# Patient Record
Sex: Male | Born: 1935 | Race: Black or African American | Hispanic: No | Marital: Married | Smoking: Former smoker
Health system: Southern US, Community
[De-identification: ages and names within clinical notes are randomized; demographics above are authoritative.]

## PROBLEM LIST (undated history)

## (undated) DIAGNOSIS — Z8739 Personal history of other diseases of the musculoskeletal system and connective tissue: Secondary | ICD-10-CM

## (undated) DIAGNOSIS — S0990XA Unspecified injury of head, initial encounter: Secondary | ICD-10-CM

## (undated) DIAGNOSIS — I10 Essential (primary) hypertension: Secondary | ICD-10-CM

## (undated) DIAGNOSIS — K5900 Constipation, unspecified: Secondary | ICD-10-CM

## (undated) DIAGNOSIS — D374 Neoplasm of uncertain behavior of colon: Secondary | ICD-10-CM

## (undated) HISTORY — PX: COLONOSCOPY W/ BIOPSIES: SHX1374

## (undated) HISTORY — PX: COLECTOMY: SHX59

---

## 2011-05-24 DIAGNOSIS — I10 Essential (primary) hypertension: Secondary | ICD-10-CM | POA: Diagnosis not present

## 2011-05-24 DIAGNOSIS — Z86010 Personal history of colonic polyps: Secondary | ICD-10-CM | POA: Diagnosis not present

## 2011-05-24 DIAGNOSIS — Z136 Encounter for screening for cardiovascular disorders: Secondary | ICD-10-CM | POA: Diagnosis not present

## 2011-05-24 DIAGNOSIS — Z13220 Encounter for screening for lipoid disorders: Secondary | ICD-10-CM | POA: Diagnosis not present

## 2011-07-06 DIAGNOSIS — K648 Other hemorrhoids: Secondary | ICD-10-CM | POA: Diagnosis not present

## 2011-07-06 DIAGNOSIS — Z86010 Personal history of colonic polyps: Secondary | ICD-10-CM | POA: Diagnosis not present

## 2011-07-06 DIAGNOSIS — Z09 Encounter for follow-up examination after completed treatment for conditions other than malignant neoplasm: Secondary | ICD-10-CM | POA: Diagnosis not present

## 2011-09-21 DIAGNOSIS — H2510 Age-related nuclear cataract, unspecified eye: Secondary | ICD-10-CM | POA: Diagnosis not present

## 2011-10-14 DIAGNOSIS — M109 Gout, unspecified: Secondary | ICD-10-CM | POA: Diagnosis not present

## 2011-10-14 DIAGNOSIS — R609 Edema, unspecified: Secondary | ICD-10-CM | POA: Diagnosis not present

## 2011-11-22 DIAGNOSIS — R0609 Other forms of dyspnea: Secondary | ICD-10-CM | POA: Diagnosis not present

## 2011-11-22 DIAGNOSIS — M109 Gout, unspecified: Secondary | ICD-10-CM | POA: Diagnosis not present

## 2011-11-22 DIAGNOSIS — I10 Essential (primary) hypertension: Secondary | ICD-10-CM | POA: Diagnosis not present

## 2011-11-22 DIAGNOSIS — R0989 Other specified symptoms and signs involving the circulatory and respiratory systems: Secondary | ICD-10-CM | POA: Diagnosis not present

## 2011-11-22 DIAGNOSIS — J3489 Other specified disorders of nose and nasal sinuses: Secondary | ICD-10-CM | POA: Diagnosis not present

## 2012-05-23 DIAGNOSIS — Z23 Encounter for immunization: Secondary | ICD-10-CM | POA: Diagnosis not present

## 2012-06-08 DIAGNOSIS — I10 Essential (primary) hypertension: Secondary | ICD-10-CM | POA: Diagnosis not present

## 2012-06-08 DIAGNOSIS — R609 Edema, unspecified: Secondary | ICD-10-CM | POA: Diagnosis not present

## 2012-07-06 DIAGNOSIS — R439 Unspecified disturbances of smell and taste: Secondary | ICD-10-CM | POA: Diagnosis not present

## 2012-07-06 DIAGNOSIS — Z79899 Other long term (current) drug therapy: Secondary | ICD-10-CM | POA: Diagnosis not present

## 2012-07-06 DIAGNOSIS — R609 Edema, unspecified: Secondary | ICD-10-CM | POA: Diagnosis not present

## 2012-09-19 DIAGNOSIS — H3530 Unspecified macular degeneration: Secondary | ICD-10-CM | POA: Diagnosis not present

## 2012-12-12 DIAGNOSIS — R439 Unspecified disturbances of smell and taste: Secondary | ICD-10-CM | POA: Diagnosis not present

## 2012-12-12 DIAGNOSIS — R609 Edema, unspecified: Secondary | ICD-10-CM | POA: Diagnosis not present

## 2012-12-12 DIAGNOSIS — I10 Essential (primary) hypertension: Secondary | ICD-10-CM | POA: Diagnosis not present

## 2013-01-12 DIAGNOSIS — I10 Essential (primary) hypertension: Secondary | ICD-10-CM | POA: Diagnosis not present

## 2013-02-24 DIAGNOSIS — Z23 Encounter for immunization: Secondary | ICD-10-CM | POA: Diagnosis not present

## 2013-06-14 DIAGNOSIS — I10 Essential (primary) hypertension: Secondary | ICD-10-CM | POA: Diagnosis not present

## 2013-06-14 DIAGNOSIS — E669 Obesity, unspecified: Secondary | ICD-10-CM | POA: Diagnosis not present

## 2013-06-14 DIAGNOSIS — Z1331 Encounter for screening for depression: Secondary | ICD-10-CM | POA: Diagnosis not present

## 2013-12-11 DIAGNOSIS — Z23 Encounter for immunization: Secondary | ICD-10-CM | POA: Diagnosis not present

## 2013-12-11 DIAGNOSIS — I499 Cardiac arrhythmia, unspecified: Secondary | ICD-10-CM | POA: Diagnosis not present

## 2013-12-11 DIAGNOSIS — R609 Edema, unspecified: Secondary | ICD-10-CM | POA: Diagnosis not present

## 2013-12-11 DIAGNOSIS — I10 Essential (primary) hypertension: Secondary | ICD-10-CM | POA: Diagnosis not present

## 2014-02-13 DIAGNOSIS — Z23 Encounter for immunization: Secondary | ICD-10-CM | POA: Diagnosis not present

## 2014-03-14 DIAGNOSIS — H35033 Hypertensive retinopathy, bilateral: Secondary | ICD-10-CM | POA: Diagnosis not present

## 2016-08-13 ENCOUNTER — Other Ambulatory Visit: Payer: Self-pay | Admitting: Gastroenterology

## 2016-08-13 DIAGNOSIS — K6389 Other specified diseases of intestine: Secondary | ICD-10-CM

## 2016-08-16 ENCOUNTER — Ambulatory Visit
Admission: RE | Admit: 2016-08-16 | Discharge: 2016-08-16 | Disposition: A | Discharge status: Home / Self Care | Payer: Medicare Other | Source: Ambulatory Visit | Attending: Gastroenterology | Admitting: Gastroenterology

## 2016-08-16 DIAGNOSIS — K6389 Other specified diseases of intestine: Secondary | ICD-10-CM

## 2016-08-16 MED ORDER — IOPAMIDOL (ISOVUE-300) INJECTION 61%
100.0000 mL | Freq: Once | INTRAVENOUS | Status: AC | PRN
  Administered 2016-08-16: 100 mL via INTRAVENOUS

## 2016-08-30 ENCOUNTER — Other Ambulatory Visit: Payer: Self-pay | Admitting: General Surgery

## 2016-09-06 ENCOUNTER — Encounter (HOSPITAL_COMMUNITY)
Admission: RE | Admit: 2016-09-06 | Discharge: 2016-09-06 | Disposition: A | Discharge status: Home / Self Care | Payer: Medicare Other | Source: Ambulatory Visit | Attending: General Surgery | Admitting: General Surgery

## 2016-09-06 ENCOUNTER — Encounter (HOSPITAL_COMMUNITY): Payer: Self-pay

## 2016-09-06 DIAGNOSIS — Z01812 Encounter for preprocedural laboratory examination: Secondary | ICD-10-CM

## 2016-09-06 DIAGNOSIS — K639 Disease of intestine, unspecified: Secondary | ICD-10-CM

## 2016-09-06 HISTORY — DX: Unspecified injury of head, initial encounter: S09.90XA

## 2016-09-06 HISTORY — DX: Constipation, unspecified: K59.00

## 2016-09-06 HISTORY — DX: Essential (primary) hypertension: I10

## 2016-09-06 LAB — CBC WITH DIFFERENTIAL/PLATELET
BASOS ABS: 0 10*3/uL (ref 0.0–0.1)
Basophils Relative: 1 %
EOS ABS: 0.2 10*3/uL (ref 0.0–0.7)
EOS PCT: 3 %
HCT: 40.1 % (ref 39.0–52.0)
Hemoglobin: 14.1 g/dL (ref 13.0–17.0)
Lymphocytes Relative: 29 %
Lymphs Abs: 2.2 10*3/uL (ref 0.7–4.0)
MCH: 30.8 pg (ref 26.0–34.0)
MCHC: 35.2 g/dL (ref 30.0–36.0)
MCV: 87.6 fL (ref 78.0–100.0)
MONO ABS: 0.4 10*3/uL (ref 0.1–1.0)
Monocytes Relative: 6 %
Neutro Abs: 4.8 10*3/uL (ref 1.7–7.7)
Neutrophils Relative %: 63 %
PLATELETS: 269 10*3/uL (ref 150–400)
RBC: 4.58 MIL/uL (ref 4.22–5.81)
RDW: 12.7 % (ref 11.5–15.5)
WBC: 7.6 10*3/uL (ref 4.0–10.5)

## 2016-09-06 LAB — COMPREHENSIVE METABOLIC PANEL
ALT: 14 U/L — AB (ref 17–63)
AST: 19 U/L (ref 15–41)
Albumin: 4 g/dL (ref 3.5–5.0)
Alkaline Phosphatase: 68 U/L (ref 38–126)
Anion gap: 9 (ref 5–15)
BUN: 12 mg/dL (ref 6–20)
CHLORIDE: 109 mmol/L (ref 101–111)
CO2: 25 mmol/L (ref 22–32)
CREATININE: 1.23 mg/dL (ref 0.61–1.24)
Calcium: 9.1 mg/dL (ref 8.9–10.3)
GFR, EST NON AFRICAN AMERICAN: 54 mL/min — AB (ref >60)
Glucose, Bld: 92 mg/dL (ref 65–99)
POTASSIUM: 3.5 mmol/L (ref 3.5–5.1)
Sodium: 143 mmol/L (ref 135–145)
Total Bilirubin: 0.7 mg/dL (ref 0.3–1.2)
Total Protein: 7.1 g/dL (ref 6.5–8.1)

## 2016-09-06 LAB — HEMOGLOBIN A1C
HEMOGLOBIN A1C: 5.5 % (ref 4.8–5.6)
MEAN PLASMA GLUCOSE: 111 mg/dL

## 2016-09-06 NOTE — Progress Notes (Signed)
Terrance Powell denies chest pain or shortness of breath.  Patient reports that he had an EKG at PCP's office within the year.  PCP is Dr Doyle Askew at [facility].  I faxed a request for EKG and last office note.

## 2016-09-06 NOTE — Pre-Procedure Instructions (Signed)
    Garris Melhorn  09/06/2016     Your procedure is scheduled on Wednesday, April 25.  Report to [facility] Admitting at 8:00 AM               Your surgery or procedure is scheduled for 10:00 AM   Call this number if you have problems the morning of surgery: [phone]   Remember:  Do not eat food or drink liquids after midnight Tuesday, April 24.  Take these medicines the morning of surgery with A SIP OF WATER : May use eye drops.   Do not wear jewelry, make-up or nail polish.  Do not wear lotions, powders, or perfumes, or deodorant.  Do not shave 48 hours prior to surgery.  Men may shave face and neck.  Do not bring valuables to the hospital.  [facility] is not responsible for any belongings or valuables.  Contacts, dentures or bridgework may not be worn into surgery.  Leave your suitcase in the car.  After surgery it may be brought to your room.  For patients admitted to the hospital, discharge time will be determined by your treatment team.  Special instructions: Colon Prep, Review  [facility] - Preparing For Surgery.  Please read over the following fact sheets that you were given: [facility]- Preparing For Surgery , Coughing and Deep Breathing, Pain Booklet, Surgical Ste Infection

## 2016-09-07 LAB — CEA: CEA: 1.6 ng/mL (ref 0.0–4.7)

## 2016-09-07 MED ORDER — ALVIMOPAN 12 MG PO CAPS
12.0000 mg | ORAL_CAPSULE | ORAL | Status: AC
  Administered 2016-09-08: 12 mg via ORAL
  Filled 2016-09-07: qty 1

## 2016-09-07 NOTE — H&P (Signed)
Odette Fraction Location: [facility] Patient #: [id number] DOB: 1934-08-04 Married / Language: English / Race: Black or African American Male       History of Present Illness       The patient is a 82 year old male who presents with a colonic mass. This is a very pleasant 82 year old [place] American man, referred by Dr. Laurence Spates for evaluation of a neoplastic mass in the proximal hepatic flexure of the colon. Dr. Christella Noa is his PCP. Recently saw Dr. Doyle Askew on March 6 for a routine checkup.       The patient has a family history of colon cancer in the father and maybe some other members but is not sure. He has a history of colon polyps in the past. He underwent screening colonoscopy recently. This shows a fungating ulcerated mass in the proximal hepatic flexure which was biopsied, tattooed, and showed villous adenoma with high-grade dysplasia, suspicious for invasion. There was also a tubular adenoma of the mid descending colon. This was read by Dr. Melanee Spry one of the local independent pathologist. I have looked at the photographs and Dr. Oletta Lamas took and they are concerning for a colon cancer.      A CT scan was subsequently done and that was negative. They did not see a mass in the colon. No sign of adenopathy or metastatic disease. I'll pass this information on to the patient      He has no GI symptoms. No abdominal pain. No alteration in his bowel habits. His to be a little constipated. She is no blood. No weight loss.      Past history is significant for colon polyps. Hypertension. Denies abdominal surgery. No history of diabetes and heart disease stroke or pulmonary disease Family history reveals follow had colon cancer and died of metastatic disease. Paternal grandmother had ovarian cancer. He is a little bit vague but thinks it may have been other family members with colon cancer Social history reveals he lives in [place]. Not married with 2 grown  daughters. Retired Surveyor, minerals for a company. Denies tobacco. Takes alcohol occasionally.      We had a long discussion. I went over anatomical drawing showing him where the lesion was in his colon. I advised him to undergo laparoscopic-assisted right colectomy, possible open colectomy and described the indications, details, anatomy, techniques, and risks of that surgery. He is aware of the risks of bleeding, infection, conversion to open laparotomy, reoperation for major complications such as anastomotic leak or injury to adjacent organs. Cardiac pulmonary and thromboembolic problems. He understands all these issues well. All of his questions were answered. He agrees with this plan.      He was given information about mechanical bowel prep I called in the 2 drug antibiotic bowel prep I discussed the bowel prep with him personally.        He has seen Dr. Doyle Askew recently. There doesn't seem to be any indication for cardiac or medical clearance.   Past Surgical History  No pertinent past surgical history   Diagnostic Studies History  Colonoscopy  within last year  Allergies Penicillins   Medication History  Lisinopril (10MG  Tablet, Oral) Active. Bumetanide (0.5MG  Tablet, Oral) Active. Medications Reconciled  Social History  Alcohol use  Occasional alcohol use. Caffeine use  Coffee, Tea.  Family History  Breast Cancer  Mother. Colon Cancer  Father. Ovarian Cancer  Family Members In General.  Other Problems High blood pressure  Review of Systems  General Not Present- Appetite Loss, Chills, Fatigue, Fever, Night Sweats, Weight Gain and Weight Loss. Skin Not Present- Change in Wart/Mole, Dryness, Hives, Jaundice, New Lesions, Non-Healing Wounds, Rash and Ulcer. HEENT Not Present- Earache, Hearing Loss, Hoarseness, Nose Bleed, Oral Ulcers, Ringing in the Ears, Seasonal Allergies, Sinus Pain, Sore Throat, Visual Disturbances, Wears glasses/contact lenses and  Yellow Eyes. Respiratory Not Present- Bloody sputum, Chronic Cough, Difficulty Breathing, Snoring and Wheezing. Cardiovascular Present- Swelling of Extremities. Not Present- Chest Pain, Difficulty Breathing Lying Down, Leg Cramps, Palpitations, Rapid Heart Rate and Shortness of Breath. Gastrointestinal Present- Constipation. Not Present- Abdominal Pain, Bloating, Bloody Stool, Change in Bowel Habits, Chronic diarrhea, Difficulty Swallowing, Excessive gas, Gets full quickly at meals, Hemorrhoids, Indigestion, Nausea, Rectal Pain and Vomiting. Male Genitourinary Present- Urgency. Not Present- Blood in Urine, Change in Urinary Stream, Frequency, Impotence, Nocturia, Painful Urination and Urine Leakage. Musculoskeletal Present- Swelling of Extremities. Not Present- Back Pain, Joint Pain, Joint Stiffness, Muscle Pain and Muscle Weakness. Neurological Not Present- Decreased Memory, Fainting, Headaches, Numbness, Seizures, Tingling, Tremor, Trouble walking and Weakness. Psychiatric Not Present- Anxiety, Bipolar, Change in Sleep Pattern, Depression, Fearful and Frequent crying. Endocrine Not Present- Cold Intolerance, Excessive Hunger, Hair Changes, Heat Intolerance, Hot flashes and New Diabetes. Hematology Not Present- Blood Thinners, Easy Bruising, Excessive bleeding, Gland problems, HIV and Persistent Infections.  Vitals  Weight: 221 lb Height: 71in Body Surface Area: 2.2 m Body Mass Index: 30.82 kg/m  Temp.: 97.74F  Pulse: 66 (Regular)  P.OX: 95% (Room air) BP: 144/86 (Sitting, Left Arm, Standard)    Physical Exam General Mental Status-Alert. General Appearance-Consistent with stated age. Hydration-Well hydrated. Voice-Normal. Note: BMI 30   Head and Neck Head-normocephalic, atraumatic with no lesions or palpable masses. Trachea-midline. Thyroid Gland Characteristics - normal size and consistency.  Eye Eyeball - Bilateral-Extraocular movements  intact. Sclera/Conjunctiva - Bilateral-No scleral icterus.  Chest and Lung Exam Chest and lung exam reveals -quiet, even and easy respiratory effort with no use of accessory muscles and on auscultation, normal breath sounds, no adventitious sounds and normal vocal resonance. Inspection Chest Wall - Normal. Back - normal.  Breast Breast - Left-Symmetric, Non Tender, No Biopsy scars, no Dimpling, No Inflammation, No Lumpectomy scars, No Mastectomy scars, No Peau d' Orange. Breast - Right-Symmetric, Non Tender, No Biopsy scars, no Dimpling, No Inflammation, No Lumpectomy scars, No Mastectomy scars, No Peau d' Orange. Breast Lump-No Palpable Breast Mass.  Cardiovascular Cardiovascular examination reveals -normal heart sounds, regular rate and rhythm with no murmurs and normal pedal pulses bilaterally.  Abdomen Inspection Inspection of the abdomen reveals - No Hernias. Skin - Scar - no surgical scars. Palpation/Percussion Palpation and Percussion of the abdomen reveal - Soft, Non Tender, No Rebound tenderness, No Rigidity (guarding) and No hepatosplenomegaly. Auscultation Auscultation of the abdomen reveals - Bowel sounds normal.  Male Genitourinary Note: He wears a depends diaper because of some occasional incontinence   Neurologic Neurologic evaluation reveals -alert and oriented x 3 with no impairment of recent or remote memory. Mental Status-Normal.  Musculoskeletal Normal Exam - Left-Upper Extremity Strength Normal and Lower Extremity Strength Normal. Normal Exam - Right-Upper Extremity Strength Normal and Lower Extremity Strength Normal.  Lymphatic Head & Neck  General Head & Neck Lymphatics: Bilateral - Description - Normal. Axillary  General Axillary Region: Bilateral - Description - Normal. Tenderness - Non Tender. Femoral & Inguinal  Generalized Femoral & Inguinal Lymphatics: Bilateral - Description - Normal. Tenderness - Non  Tender.    Assessment & Plan  HEPATIC FLEXURE MASS (K63.9)  Dr. Oletta Lamas found an ulcerated mass at the hepatic flexure of your colon Biopsy shows worrisome precancerous changes This may actually be a cancer now The CT scan shows no evidence of cancer or spread to other organs. That is good news  We have had a long discussion about management of this you will be scheduled for laparoscopic-assisted ascending colectomy, possible open colectomy in the near future  you will need to take a mechanical bowel prep the day before surgery which will clean UL you will also take 2 different antibiotic pills a day before surgery to reduce the risk of infection you will need to be on a clear liquid diet for 36 hours prior to the surgery  We have discussed the indications, techniques, and risk of this surgery in detail Please review the printed information that I gave you  Started Neomycin Sulfate 500MG , 2 (two) Tablet SEE NOTE, #6, 08/30/2016, No Refill. Local Order: TAKE TWO TABLETS AT 2 PM, 3 PM, AND 10 PM THE DAY PRIOR TO SURGERY Started Flagyl 500MG , 2 (two) Tablet SEE NOTE, #6, 08/30/2016, No Refill. Local Order: Take at 2pm, 3pm, and 10pm the day prior to your colon operation Pt Education - Pamphlet Given - Colorectal Surgery: discussed with patient and provided information.    FAMILY HISTORY OF COLON CANCER IN FATHER (Z80.0) HYPERTENSION, BENIGN (I10) URINARY INCONTINENCE, URGE (N39.41)    Edsel Petrin. Dalbert Batman, M.D., [facility] General and Minimally invasive Surgery Breast and Colorectal Surgery Office:   [phone] Pager:   [phone]

## 2016-09-07 NOTE — Progress Notes (Signed)
[facility] most recent EKG was in 2016 will obtain DOS.

## 2016-09-08 ENCOUNTER — Encounter (HOSPITAL_COMMUNITY): Payer: Self-pay

## 2016-09-08 ENCOUNTER — Inpatient Hospital Stay (HOSPITAL_COMMUNITY): Payer: Medicare Other | Admitting: Anesthesiology

## 2016-09-08 ENCOUNTER — Inpatient Hospital Stay (HOSPITAL_COMMUNITY)
Admission: RE | Admit: 2016-09-08 | Discharge: 2016-09-12 | DRG: 331 | Disposition: A | Discharge status: Home / Self Care | Payer: Medicare Other | Source: Ambulatory Visit | Attending: General Surgery | Admitting: General Surgery

## 2016-09-08 ENCOUNTER — Encounter (HOSPITAL_COMMUNITY)
Admission: RE | Disposition: A | Discharge status: Home / Self Care | Payer: Self-pay | Source: Ambulatory Visit | Attending: General Surgery

## 2016-09-08 DIAGNOSIS — I10 Essential (primary) hypertension: Secondary | ICD-10-CM | POA: Diagnosis present

## 2016-09-08 DIAGNOSIS — R1909 Other intra-abdominal and pelvic swelling, mass and lump: Secondary | ICD-10-CM | POA: Diagnosis present

## 2016-09-08 DIAGNOSIS — Z880 Allergy status to penicillin: Secondary | ICD-10-CM

## 2016-09-08 DIAGNOSIS — Z79899 Other long term (current) drug therapy: Secondary | ICD-10-CM | POA: Diagnosis not present

## 2016-09-08 DIAGNOSIS — R12 Heartburn: Secondary | ICD-10-CM | POA: Diagnosis not present

## 2016-09-08 DIAGNOSIS — C184 Malignant neoplasm of transverse colon: Secondary | ICD-10-CM | POA: Diagnosis present

## 2016-09-08 DIAGNOSIS — Z87891 Personal history of nicotine dependence: Secondary | ICD-10-CM | POA: Diagnosis not present

## 2016-09-08 DIAGNOSIS — M109 Gout, unspecified: Secondary | ICD-10-CM | POA: Diagnosis present

## 2016-09-08 DIAGNOSIS — D374 Neoplasm of uncertain behavior of colon: Secondary | ICD-10-CM

## 2016-09-08 DIAGNOSIS — Z8739 Personal history of other diseases of the musculoskeletal system and connective tissue: Secondary | ICD-10-CM

## 2016-09-08 HISTORY — DX: Neoplasm of uncertain behavior of colon: D37.4

## 2016-09-08 HISTORY — PX: LAPAROSCOPIC RIGHT COLECTOMY: SHX5925

## 2016-09-08 HISTORY — DX: Personal history of other diseases of the musculoskeletal system and connective tissue: Z87.39

## 2016-09-08 LAB — CBC
HEMATOCRIT: 36.3 % — AB (ref 39.0–52.0)
Hemoglobin: 12.8 g/dL — ABNORMAL LOW (ref 13.0–17.0)
MCH: 31 pg (ref 26.0–34.0)
MCHC: 35.3 g/dL (ref 30.0–36.0)
MCV: 87.9 fL (ref 78.0–100.0)
Platelets: 239 10*3/uL (ref 150–400)
RBC: 4.13 MIL/uL — ABNORMAL LOW (ref 4.22–5.81)
RDW: 12.8 % (ref 11.5–15.5)
WBC: 11.7 10*3/uL — ABNORMAL HIGH (ref 4.0–10.5)

## 2016-09-08 LAB — CREATININE, SERUM
Creatinine, Ser: 1.37 mg/dL — ABNORMAL HIGH (ref 0.61–1.24)
GFR calc Af Amer: 55 mL/min — ABNORMAL LOW (ref >60)
GFR, EST NON AFRICAN AMERICAN: 47 mL/min — AB (ref >60)

## 2016-09-08 SURGERY — COLECTOMY, RIGHT, LAPAROSCOPIC
Anesthesia: General | Site: Abdomen

## 2016-09-08 MED ORDER — PHENYLEPHRINE HCL 10 MG/ML IJ SOLN
INTRAMUSCULAR | Status: DC | PRN
  Administered 2016-09-08: 80 ug via INTRAVENOUS
  Administered 2016-09-08: 40 ug via INTRAVENOUS

## 2016-09-08 MED ORDER — CEFOTETAN DISODIUM-DEXTROSE 2-2.08 GM-% IV SOLR
INTRAVENOUS | Status: AC
  Filled 2016-09-08: qty 50

## 2016-09-08 MED ORDER — ONDANSETRON HCL 4 MG PO TABS: 4.0000 mg | ORAL_TABLET | Freq: Four times a day (QID) | ORAL | Status: DC | PRN

## 2016-09-08 MED ORDER — SUGAMMADEX SODIUM 200 MG/2ML IV SOLN
INTRAVENOUS | Status: DC | PRN
  Administered 2016-09-08: 200 mg via INTRAVENOUS

## 2016-09-08 MED ORDER — EPHEDRINE 5 MG/ML INJ
INTRAVENOUS | Status: AC
  Filled 2016-09-08: qty 10

## 2016-09-08 MED ORDER — PROMETHAZINE HCL 25 MG/ML IJ SOLN: 6.2500 mg | INTRAMUSCULAR | Status: DC | PRN

## 2016-09-08 MED ORDER — SUGAMMADEX SODIUM 200 MG/2ML IV SOLN
INTRAVENOUS | Status: AC
  Filled 2016-09-08: qty 2

## 2016-09-08 MED ORDER — NAPHAZOLINE-GLYCERIN 0.012-0.2 % OP SOLN: 2.0000 [drp] | Freq: Four times a day (QID) | OPHTHALMIC | Status: DC | PRN

## 2016-09-08 MED ORDER — LIDOCAINE HCL (CARDIAC) 20 MG/ML IV SOLN
INTRAVENOUS | Status: DC | PRN
  Administered 2016-09-08: 80 mg via INTRATRACHEAL
  Administered 2016-09-08: 20 mg via INTRATRACHEAL

## 2016-09-08 MED ORDER — SODIUM CHLORIDE 0.9 % IR SOLN
Status: DC | PRN
  Administered 2016-09-08: 1

## 2016-09-08 MED ORDER — HYDROMORPHONE HCL 1 MG/ML IJ SOLN
1.0000 mg | INTRAMUSCULAR | Status: DC | PRN
  Administered 2016-09-08 – 2016-09-09 (×3): 1 mg via INTRAVENOUS
  Filled 2016-09-08 (×3): qty 1

## 2016-09-08 MED ORDER — ALVIMOPAN 12 MG PO CAPS
12.0000 mg | ORAL_CAPSULE | Freq: Two times a day (BID) | ORAL | Status: DC
  Administered 2016-09-09 (×2): 12 mg via ORAL
  Filled 2016-09-08 (×2): qty 1

## 2016-09-08 MED ORDER — LISINOPRIL 20 MG PO TABS
20.0000 mg | ORAL_TABLET | Freq: Every day | ORAL | Status: DC
  Administered 2016-09-08 – 2016-09-12 (×5): 20 mg via ORAL
  Filled 2016-09-08 (×5): qty 1

## 2016-09-08 MED ORDER — BUPIVACAINE HCL (PF) 0.25 % IJ SOLN
INTRAMUSCULAR | Status: AC
  Filled 2016-09-08: qty 30

## 2016-09-08 MED ORDER — CEFOTETAN DISODIUM-DEXTROSE 2-2.08 GM-% IV SOLR
2.0000 g | Freq: Once | INTRAVENOUS | Status: AC
  Administered 2016-09-08: 2 g via INTRAVENOUS

## 2016-09-08 MED ORDER — LIDOCAINE 2% (20 MG/ML) 5 ML SYRINGE
INTRAMUSCULAR | Status: AC
  Filled 2016-09-08: qty 5

## 2016-09-08 MED ORDER — PHENYLEPHRINE 40 MCG/ML (10ML) SYRINGE FOR IV PUSH (FOR BLOOD PRESSURE SUPPORT)
PREFILLED_SYRINGE | INTRAVENOUS | Status: AC
  Filled 2016-09-08: qty 10

## 2016-09-08 MED ORDER — HYDROMORPHONE HCL 1 MG/ML IJ SOLN
0.2500 mg | INTRAMUSCULAR | Status: DC | PRN
  Administered 2016-09-08: 0.5 mg via INTRAVENOUS

## 2016-09-08 MED ORDER — ONDANSETRON HCL 4 MG/2ML IJ SOLN: 4.0000 mg | Freq: Four times a day (QID) | INTRAMUSCULAR | Status: DC | PRN

## 2016-09-08 MED ORDER — ENOXAPARIN SODIUM 40 MG/0.4ML ~~LOC~~ SOLN
40.0000 mg | SUBCUTANEOUS | Status: DC
  Administered 2016-09-09 – 2016-09-12 (×4): 40 mg via SUBCUTANEOUS
  Filled 2016-09-08 (×4): qty 0.4

## 2016-09-08 MED ORDER — EPHEDRINE SULFATE 50 MG/ML IJ SOLN
INTRAMUSCULAR | Status: DC | PRN
  Administered 2016-09-08 (×2): 5 mg via INTRAVENOUS
  Administered 2016-09-08 (×3): 10 mg via INTRAVENOUS
  Administered 2016-09-08 (×2): 5 mg via INTRAVENOUS

## 2016-09-08 MED ORDER — FENTANYL CITRATE (PF) 250 MCG/5ML IJ SOLN
INTRAMUSCULAR | Status: AC
  Filled 2016-09-08: qty 5

## 2016-09-08 MED ORDER — ONDANSETRON HCL 4 MG/2ML IJ SOLN
INTRAMUSCULAR | Status: AC
  Filled 2016-09-08: qty 2

## 2016-09-08 MED ORDER — POTASSIUM CHLORIDE 2 MEQ/ML IV SOLN
INTRAVENOUS | Status: DC
  Administered 2016-09-08 – 2016-09-11 (×4): via INTRAVENOUS
  Filled 2016-09-08 (×8): qty 1000

## 2016-09-08 MED ORDER — ALBUMIN HUMAN 5 % IV SOLN
INTRAVENOUS | Status: DC | PRN
  Administered 2016-09-08 (×3): via INTRAVENOUS

## 2016-09-08 MED ORDER — LIDOCAINE 2% (20 MG/ML) 5 ML SYRINGE
INTRAMUSCULAR | Status: AC
  Filled 2016-09-08: qty 10

## 2016-09-08 MED ORDER — CHLORHEXIDINE GLUCONATE CLOTH 2 % EX PADS: 6.0000 | MEDICATED_PAD | Freq: Once | CUTANEOUS | Status: DC

## 2016-09-08 MED ORDER — HYDROMORPHONE HCL 1 MG/ML IJ SOLN
INTRAMUSCULAR | Status: AC
  Filled 2016-09-08: qty 0.5

## 2016-09-08 MED ORDER — HYDROCODONE-ACETAMINOPHEN 5-325 MG PO TABS
1.0000 | ORAL_TABLET | ORAL | Status: DC | PRN
  Administered 2016-09-10: 1 via ORAL
  Filled 2016-09-08: qty 1

## 2016-09-08 MED ORDER — FENTANYL CITRATE (PF) 100 MCG/2ML IJ SOLN
INTRAMUSCULAR | Status: DC | PRN
  Administered 2016-09-08 (×5): 50 ug via INTRAVENOUS

## 2016-09-08 MED ORDER — LACTATED RINGERS IV SOLN
INTRAVENOUS | Status: DC
  Administered 2016-09-08 (×3): via INTRAVENOUS

## 2016-09-08 MED ORDER — BUPIVACAINE-EPINEPHRINE 0.25% -1:200000 IJ SOLN
INTRAMUSCULAR | Status: DC | PRN
  Administered 2016-09-08: 14 mL

## 2016-09-08 MED ORDER — ROCURONIUM BROMIDE 100 MG/10ML IV SOLN
INTRAVENOUS | Status: DC | PRN
  Administered 2016-09-08: 50 mg via INTRAVENOUS
  Administered 2016-09-08: 10 mg via INTRAVENOUS

## 2016-09-08 MED ORDER — ROCURONIUM BROMIDE 10 MG/ML (PF) SYRINGE
PREFILLED_SYRINGE | INTRAVENOUS | Status: AC
  Filled 2016-09-08: qty 5

## 2016-09-08 MED ORDER — BUMETANIDE 0.5 MG PO TABS
0.5000 mg | ORAL_TABLET | Freq: Every day | ORAL | Status: DC
  Administered 2016-09-08 – 2016-09-12 (×5): 0.5 mg via ORAL
  Filled 2016-09-08 (×5): qty 1

## 2016-09-08 MED ORDER — 0.9 % SODIUM CHLORIDE (POUR BTL) OPTIME
TOPICAL | Status: DC | PRN
  Administered 2016-09-08: 1000 mL

## 2016-09-08 MED ORDER — PROPOFOL 10 MG/ML IV BOLUS
INTRAVENOUS | Status: DC | PRN
  Administered 2016-09-08: 170 mg via INTRAVENOUS
  Administered 2016-09-08: 10 mg via INTRAVENOUS

## 2016-09-08 MED ORDER — ONDANSETRON HCL 4 MG/2ML IJ SOLN
INTRAMUSCULAR | Status: DC | PRN
  Administered 2016-09-08: 4 mg via INTRAVENOUS

## 2016-09-08 MED ORDER — DEXTROSE 5 % IV SOLN
2.0000 g | Freq: Two times a day (BID) | INTRAVENOUS | Status: AC
  Administered 2016-09-08: 2 g via INTRAVENOUS
  Filled 2016-09-08: qty 2

## 2016-09-08 MED ORDER — PHENYLEPHRINE HCL 10 MG/ML IJ SOLN
INTRAVENOUS | Status: DC | PRN
  Administered 2016-09-08: 50 ug/min via INTRAVENOUS

## 2016-09-08 SURGICAL SUPPLY — 63 items
APPLIER CLIP ROT 10 11.4 M/L (STAPLE)
BLADE CLIPPER SURG (BLADE) IMPLANT
CANISTER SUCT 3000ML PPV (MISCELLANEOUS) ×2 IMPLANT
CELLS DAT CNTRL 66122 CELL SVR (MISCELLANEOUS) ×1 IMPLANT
CHLORAPREP W/TINT 26ML (MISCELLANEOUS) ×2 IMPLANT
CLIP APPLIE ROT 10 11.4 M/L (STAPLE) IMPLANT
COVER MAYO STAND STRL (DRAPES) ×2 IMPLANT
COVER SURGICAL LIGHT HANDLE (MISCELLANEOUS) ×4 IMPLANT
DRAPE HALF SHEET 40X57 (DRAPES) ×2 IMPLANT
DRAPE LAPAROSCOPIC ABDOMINAL (DRAPES) ×2 IMPLANT
DRAPE UTILITY XL STRL (DRAPES) ×10 IMPLANT
DRAPE WARM FLUID 44X44 (DRAPE) ×2 IMPLANT
DRESSING TELFA 8X10 (GAUZE/BANDAGES/DRESSINGS) ×2 IMPLANT
DRSG OPSITE POSTOP 4X10 (GAUZE/BANDAGES/DRESSINGS) IMPLANT
DRSG OPSITE POSTOP 4X6 (GAUZE/BANDAGES/DRESSINGS) ×2 IMPLANT
DRSG OPSITE POSTOP 4X8 (GAUZE/BANDAGES/DRESSINGS) IMPLANT
DRSG TEGADERM 2-3/8X2-3/4 SM (GAUZE/BANDAGES/DRESSINGS) ×2 IMPLANT
ELECT BLADE 6.5 EXT (BLADE) IMPLANT
ELECT CAUTERY BLADE 6.4 (BLADE) ×4 IMPLANT
ELECT REM PT RETURN 9FT ADLT (ELECTROSURGICAL) ×2
ELECTRODE REM PT RTRN 9FT ADLT (ELECTROSURGICAL) ×1 IMPLANT
GAUZE SPONGE 2X2 8PLY NS (GAUZE/BANDAGES/DRESSINGS) ×2 IMPLANT
GEL ULTRASOUND 20GR AQUASONIC (MISCELLANEOUS) IMPLANT
GLOVE EUDERMIC 7 POWDERFREE (GLOVE) ×4 IMPLANT
GOWN STRL REUS W/ TWL LRG LVL3 (GOWN DISPOSABLE) ×6 IMPLANT
GOWN STRL REUS W/ TWL XL LVL3 (GOWN DISPOSABLE) ×2 IMPLANT
GOWN STRL REUS W/TWL LRG LVL3 (GOWN DISPOSABLE) ×6
GOWN STRL REUS W/TWL XL LVL3 (GOWN DISPOSABLE) ×2
KIT BASIN OR (CUSTOM PROCEDURE TRAY) ×2 IMPLANT
LIGASURE IMPACT 36 18CM CVD LR (INSTRUMENTS) ×2 IMPLANT
NS IRRIG 1000ML POUR BTL (IV SOLUTION) ×4 IMPLANT
PAD ARMBOARD 7.5X6 YLW CONV (MISCELLANEOUS) ×4 IMPLANT
PENCIL BUTTON HOLSTER BLD 10FT (ELECTRODE) ×4 IMPLANT
RELOAD PROXIMATE 75MM BLUE (ENDOMECHANICALS) ×4 IMPLANT
RTRCTR WOUND ALEXIS 18CM MED (MISCELLANEOUS) ×2
SCISSORS LAP 5X35 DISP (ENDOMECHANICALS) IMPLANT
SET IRRIG TUBING LAPAROSCOPIC (IRRIGATION / IRRIGATOR) IMPLANT
SHEARS HARMONIC ACE PLUS 36CM (ENDOMECHANICALS) ×2 IMPLANT
SLEEVE ENDOPATH XCEL 5M (ENDOMECHANICALS) ×4 IMPLANT
SPECIMEN JAR LARGE (MISCELLANEOUS) ×2 IMPLANT
STAPLER GUN LINEAR PROX 60 (STAPLE) ×2 IMPLANT
STAPLER PROXIMATE 75MM BLUE (STAPLE) ×2 IMPLANT
STAPLER VISISTAT 35W (STAPLE) ×2 IMPLANT
SUCTION POOLE TIP (SUCTIONS) ×2 IMPLANT
SUT PDS AB 1 TP1 96 (SUTURE) ×4 IMPLANT
SUT SILK 2 0 SH CR/8 (SUTURE) ×2 IMPLANT
SUT SILK 2 0 TIES 10X30 (SUTURE) ×2 IMPLANT
SUT SILK 3 0 SH CR/8 (SUTURE) ×2 IMPLANT
SUT SILK 3 0 TIES 10X30 (SUTURE) ×2 IMPLANT
SYR BULB IRRIGATION 50ML (SYRINGE) ×4 IMPLANT
SYS LAPSCP GELPORT 120MM (MISCELLANEOUS)
SYSTEM LAPSCP GELPORT 120MM (MISCELLANEOUS) IMPLANT
TOWEL OR 17X26 10 PK STRL BLUE (TOWEL DISPOSABLE) ×4 IMPLANT
TRAY FOLEY W/METER SILVER 16FR (SET/KITS/TRAYS/PACK) ×2 IMPLANT
TRAY LAPAROSCOPIC MC (CUSTOM PROCEDURE TRAY) ×2 IMPLANT
TROCAR XCEL 12X100 BLDLESS (ENDOMECHANICALS) IMPLANT
TROCAR XCEL BLUNT TIP 100MML (ENDOMECHANICALS) IMPLANT
TROCAR XCEL NON-BLD 11X100MML (ENDOMECHANICALS) IMPLANT
TROCAR XCEL NON-BLD 5MMX100MML (ENDOMECHANICALS) ×2 IMPLANT
TUBE CONNECTING 12X1/4 (SUCTIONS) ×4 IMPLANT
TUBING INSUF HEATED (TUBING) ×2 IMPLANT
TUBING INSUFFLATION (TUBING) ×2 IMPLANT
YANKAUER SUCT BULB TIP NO VENT (SUCTIONS) ×6 IMPLANT

## 2016-09-08 NOTE — Op Note (Addendum)
Patient Name:           Terrance Powell   Date of Surgery:        09/08/2016  Pre op Diagnosis:      Neoplastic mass of proximal transverse colon  Post op Diagnosis:    Same  Procedure:                 Laparoscopic assisted right colectomy  Surgeon:                     Edsel Petrin. Dalbert Batman, M.D., FACS  Assistant:                      Jens Som, M.D.   Indication for Assistant: Complex exposure and technique.  Surgeon assist indicated to reduce incidence of intraoperative and postoperative complications  Operative Indications:    This is a very pleasant 81 year old [place] American man, referred by Dr. Laurence Spates for evaluation of a neoplastic mass in the proximal hepatic flexure of the colon. Dr. Christella Noa is his PCP. Recently saw Dr. Doyle Askew on March 6 for a routine checkup.       The patient has a family history of colon cancer in the father and maybe some other members but is not sure. He has a history of colon polyps in the past. He underwent screening colonoscopy recently. This shows a fungating ulcerated mass in the proximal hepatic flexure which was biopsied, tattooed, and showed villous adenoma with high-grade dysplasia, suspicious for invasion. There was also a tubular adenoma of the mid ascending colon. This was read by Dr. Melanee Spry one of the local independent pathologists. I have looked at the photographs and Dr. Oletta Lamas took and they are concerning for a colon cancer.      A CT scan was subsequently done and that was negative. They did not see a mass in the colon. No sign of adenopathy or metastatic disease. I passed this information on to the patient      He has no GI symptoms. No abdominal pain. No alteration in his bowel habits. His to be a little constipated. She is no blood. No weight loss.      Past history is significant for colon polyps. Hypertension. Denies abdominal surgery. No history of diabetes and heart disease stroke or pulmonary disease Family  history reveals follow had colon cancer and died of metastatic disease. Paternal grandmother had ovarian cancer. He is a little bit vague but thinks it may have been other family members with colon cancer      We had a long discussion. I went over anatomical drawing showing him where the lesion was in his colon. I advised him to undergo laparoscopic-assisted right colectomy, possible open colectomy . He agrees with this plan.Marland Kitchen  Operative Findings:       The tattoo was in the transverse colon, right at the middle colic vessels.  There was a 2 cm palpable mass just proximal to the tattoo.  The middle colic vessels had to be sacrificed, but the proximal and distal segments looked very healthy with excellent color and blood supply during the anastomosis.  There was no adenopathy or liver mass.  Dr. Tresa Moore in pathology looked at the gross specimen and identified the tumor and said we had a 3-4 cm distal margin.  Procedure in Detail:          Following the induction of general endotracheal anesthesia a Foley catheter was placed.  Surgical  timeout was performed.  Intravenous antibiotics were given.  The patient's abdomen was prepped and draped in a sterile fashion.  0.5% Marcaine was used as a local infiltration anesthetic.     An 11 mm Hassan trocar was placed in the midline just above the umbilicus with an open technique.  This was secured with the Purstring suture of 0 Vicryl.  Pneumoperitoneum was created..  Video camera was inserted.  There is no evidence of bleeding or intestinal injury.  5 mm trochars were placed in the midline below the umbilicus, right lower quadrant, left lower quadrant, and left upper quadrant.  We identified the tattoo.  We mobilized the transverse colon off of the omentum from the mid transverse colon all the way over to the hepatic flexure being careful to identify and avoid the duodenum.  We then mobilized the the terminal ileum and appendix and cecum and right colon by dividing  the lateral peritoneal attachments.  These were slowly swept from lateral to medial.  Harmonic scalpel was used frequently.  Scissors were used frequently to perform this dissection.  Ultimately had the terminal ileum right colon and transverse colon completely mobilized.  There was no significant bleeding.  The gallbladder liver duodenum and stomach looked fine.     We released the pneumoperitoneum.  I made a 9-10 cm incision in the midline above the umbilicus incorporating the trocar sites.  The fascia was incised in the midline.  A self-retaining wound protector was placed.  We easily delivered the terminal ileum right colon and transverse colon into the wound.  We can palpate the mass in the transverse colon.  We transected the mid transverse colon with the GIA stapling device several centimeters distal to the palpable tumor.  We transected the terminal ileum about 3 inches proximal to the ileocecal valve.  The mesentery was divided.  Smaller vessels were divided with the harmonic scalpel.  Middle colic vessels and right colic vessels were clamped divided and doubly ligated with 2-0 silk ties.  The specimen was sent to the lab.  Pathologist performed a gross evaluation, identifying the tumor and commenting on the negative margins.      Anastomosis was created between the terminal ileum and the mid transverse colon with the GIA stapling device.  The proximal and distal limbs of bowel were pink and healthy and bled freely.  The common defect was closed with a TA 60 stapler.  Further silk sutures were placed to reinforce the staple line at critical points.  It appeared airtight.  Widely patent anastomosis.  No bleeding.  We irrigated the abdomen and the did not appear to be any active bleeding anywhere.  We visualized the duodenum and it looked fine.     We then changed our gowns gloves, drapes and instruments with the protocol changeover.  We irrigated one more time and the did not appear to be any bleeding.   Midline fascia was closed with a running suture of #1 double-stranded PDS.  Skin was closed with skin staples and Telfa wicks were placed in-between the staples.  We reinsufflated the abdomen and did a 4 quadrant inspection and there was almost no irrigation fluid and certainly no active bleeding.  Incision looked good and nothing was caught under the incision.  The pneumoperitoneum was released.  The trochars were removed.  Trocar sites were closed with skin staples.  Protocol  bandages were placed and the patient taken to PACU in stable condition.  EBL 100 mL or less.  Counts correct.  Complications none.     Edsel Petrin. Dalbert Batman, M.D., FACS General and Minimally Invasive Surgery Breast and Colorectal Surgery  09/08/2016 12:29 PM

## 2016-09-08 NOTE — Anesthesia Preprocedure Evaluation (Addendum)
Anesthesia Evaluation  Patient identified by MRN, date of birth, ID band Patient awake    Reviewed: Allergy & Precautions, NPO status   History of Anesthesia Complications Negative for: history of anesthetic complications  Airway Mallampati: II   Neck ROM: Full    Dental no notable dental hx.    Pulmonary former smoker,    breath sounds clear to auscultation       Cardiovascular hypertension,  Rhythm:Regular Rate:Normal     Neuro/Psych    GI/Hepatic negative GI ROS, Neg liver ROS,   Endo/Other  negative endocrine ROS  Renal/GU negative Renal ROS     Musculoskeletal negative musculoskeletal ROS (+)   Abdominal   Peds  Hematology negative hematology ROS (+)   Anesthesia Other Findings   Reproductive/Obstetrics                            Anesthesia Physical Anesthesia Plan  ASA: II  Anesthesia Plan: General   Post-op Pain Management:    Induction: Intravenous  Airway Management Planned: Oral ETT  Additional Equipment:   Intra-op Plan:   Post-operative Plan: Extubation in OR  Informed Consent: I have reviewed the patients History and Physical, chart, labs and discussed the procedure including the risks, benefits and alternatives for the proposed anesthesia with the patient or authorized representative who has indicated his/her understanding and acceptance.     Plan Discussed with:   Anesthesia Plan Comments:         Anesthesia Quick Evaluation

## 2016-09-08 NOTE — Interval H&P Note (Signed)
History and Physical Interval Note:  09/08/2016 9:04 AM  [name]  has presented today for surgery, with the diagnosis of Colon mass  The various methods of treatment have been discussed with the patient and family. After consideration of risks, benefits and other options for treatment, the patient has consented to  Procedure(s): LAP ASSISTED ASCENDING COLECTOMY, POSSIBLE OPEN (N/A) as a surgical intervention .  The patient's history has been reviewed, patient examined, no change in status, stable for surgery.  I have reviewed the patient's chart and labs.  Questions were answered to the patient's satisfaction.     Terrance Powell

## 2016-09-08 NOTE — Transfer of Care (Signed)
Immediate Anesthesia Transfer of Care Note  Patient: Terrance Powell  Procedure(s) Performed: Procedure(s): LAP ASSISTED ASCENDING COLECTOMY (N/A)  Patient Location: PACU  Anesthesia Type:General  Level of Consciousness: awake, alert , oriented and sedated  Airway & Oxygen Therapy: Patient Spontanous Breathing and Patient connected to nasal cannula oxygen  Post-op Assessment: Report given to RN, Post -op Vital signs reviewed and stable and Patient moving all extremities  Post vital signs: stable  Reviewed and stable   Last Vitals:  Vitals:   09/08/16 0819 09/08/16 1219  BP: (!) 160/93   Pulse: 71 (P) 100  Resp: 20 (P) 16  Temp: 36.5 C     Last Pain:  Vitals:   09/08/16 0819  TempSrc: Oral      Patients Stated Pain Goal: 4 (02/40/97 3532)  Complications: No apparent anesthesia complications

## 2016-09-08 NOTE — Progress Notes (Signed)
Pt admitted from PACU  After having a right colectomy. Alert, oriented and able to voice needs. Foley in place and to be removed on post op day 2.

## 2016-09-08 NOTE — Anesthesia Procedure Notes (Signed)
Procedure Name: Intubation Date/Time: 09/08/2016 9:45 AM Performed by: Scheryl Darter Pre-anesthesia Checklist: Patient identified, Emergency Drugs available, Suction available and Patient being monitored Patient Re-evaluated:Patient Re-evaluated prior to inductionOxygen Delivery Method: Circle System Utilized Preoxygenation: Pre-oxygenation with 100% oxygen Intubation Type: IV induction Ventilation: Mask ventilation without difficulty Laryngoscope Size: Miller and 3 Grade View: Grade II Tube type: Oral Number of attempts: 1 Airway Equipment and Method: Stylet and Oral airway Placement Confirmation: ETT inserted through vocal cords under direct vision,  positive ETCO2 and breath sounds checked- equal and bilateral Tube secured with: Tape Dental Injury: Teeth and Oropharynx as per pre-operative assessment  Difficulty Due To: Difficult Airway- due to anterior larynx

## 2016-09-09 ENCOUNTER — Encounter (HOSPITAL_COMMUNITY): Payer: Self-pay | Admitting: General Surgery

## 2016-09-09 LAB — CBC
HCT: 35.9 % — ABNORMAL LOW (ref 39.0–52.0)
Hemoglobin: 12.8 g/dL — ABNORMAL LOW (ref 13.0–17.0)
MCH: 31.2 pg (ref 26.0–34.0)
MCHC: 35.7 g/dL (ref 30.0–36.0)
MCV: 87.6 fL (ref 78.0–100.0)
Platelets: 247 10*3/uL (ref 150–400)
RBC: 4.1 MIL/uL — ABNORMAL LOW (ref 4.22–5.81)
RDW: 13 % (ref 11.5–15.5)
WBC: 11.9 10*3/uL — ABNORMAL HIGH (ref 4.0–10.5)

## 2016-09-09 LAB — BASIC METABOLIC PANEL
ANION GAP: 7 (ref 5–15)
BUN: 8 mg/dL (ref 6–20)
CO2: 26 mmol/L (ref 22–32)
Calcium: 8.6 mg/dL — ABNORMAL LOW (ref 8.9–10.3)
Chloride: 105 mmol/L (ref 101–111)
Creatinine, Ser: 1.41 mg/dL — ABNORMAL HIGH (ref 0.61–1.24)
GFR calc Af Amer: 53 mL/min — ABNORMAL LOW (ref >60)
GFR, EST NON AFRICAN AMERICAN: 46 mL/min — AB (ref >60)
Glucose, Bld: 130 mg/dL — ABNORMAL HIGH (ref 65–99)
POTASSIUM: 4.2 mmol/L (ref 3.5–5.1)
SODIUM: 138 mmol/L (ref 135–145)

## 2016-09-09 NOTE — Progress Notes (Signed)
1 Day Post-Op  Subjective: Doing well. Alert.  Comfortable.  Pain well controlled No shortness of breath.  No nausea. Able to sleep Excellent urine output Operative findings discussed with patient Hemoglobin 12.8.  WBC 11.9.  BUN 8.  Creatinine 1.41.  Potassium 4.2.  Glucose 130.    Objective: Vital signs in last 24 hours: Temp:  [97.5 F (36.4 C)-98.6 F (37 C)] 98.6 F (37 C) (04/26 0532) Pulse Rate:  [71-100] 80 (04/26 0532) Resp:  [16-24] 18 (04/26 0532) BP: (134-173)/(68-96) 134/68 (04/26 0532) SpO2:  [97 %-100 %] 97 % (04/26 0532) Weight:  [99.3 kg (219 lb)] 99.3 kg (219 lb) (04/25 0854) Last BM Date: 09/07/16  Intake/Output from previous day: 04/25 0701 - 04/26 0700 In: 3310 [P.O.:60; I.V.:2750; IV Piggyback:500] Out: 6812 [Urine:2450; Blood:25] Intake/Output this shift: Total I/O In: 1310 [P.O.:60; I.V.:1250] Out: 2050 [Urine:2050]   EXAM: General appearance: Alert.  No distress.  Cooperative.  Mental status normal.  In good spirits. Resp: clear to auscultation bilaterally.  Vital capacity 1700 mL. GI: Soft.  Minimally tender.  Hypoactive bowel sounds.  Wounds clean.  No bleeding.    Lab Results:  Results for orders placed or performed during the hospital encounter of 09/08/16 (from the past 24 hour(s))  CBC     Status: Abnormal   Collection Time: 09/08/16  3:31 PM  Result Value Ref Range   WBC 11.7 (H) 4.0 - 10.5 K/uL   RBC 4.13 (L) 4.22 - 5.81 MIL/uL   Hemoglobin 12.8 (L) 13.0 - 17.0 g/dL   HCT 36.3 (L) 39.0 - 52.0 %   MCV 87.9 78.0 - 100.0 fL   MCH 31.0 26.0 - 34.0 pg   MCHC 35.3 30.0 - 36.0 g/dL   RDW 12.8 11.5 - 15.5 %   Platelets 239 150 - 400 K/uL  Creatinine, serum     Status: Abnormal   Collection Time: 09/08/16  3:31 PM  Result Value Ref Range   Creatinine, Ser 1.37 (H) 0.61 - 1.24 mg/dL   GFR calc non Af Amer 47 (L) >60 mL/min   GFR calc Af Amer 55 (L) >60 mL/min  Basic metabolic panel     Status: Abnormal   Collection Time: 09/09/16   5:13 AM  Result Value Ref Range   Sodium 138 135 - 145 mmol/L   Potassium 4.2 3.5 - 5.1 mmol/L   Chloride 105 101 - 111 mmol/L   CO2 26 22 - 32 mmol/L   Glucose, Bld 130 (H) 65 - 99 mg/dL   BUN 8 6 - 20 mg/dL   Creatinine, Ser 1.41 (H) 0.61 - 1.24 mg/dL   Calcium 8.6 (L) 8.9 - 10.3 mg/dL   GFR calc non Af Amer 46 (L) >60 mL/min   GFR calc Af Amer 53 (L) >60 mL/min   Anion gap 7 5 - 15  CBC     Status: Abnormal   Collection Time: 09/09/16  5:13 AM  Result Value Ref Range   WBC 11.9 (H) 4.0 - 10.5 K/uL   RBC 4.10 (L) 4.22 - 5.81 MIL/uL   Hemoglobin 12.8 (L) 13.0 - 17.0 g/dL   HCT 35.9 (L) 39.0 - 52.0 %   MCV 87.6 78.0 - 100.0 fL   MCH 31.2 26.0 - 34.0 pg   MCHC 35.7 30.0 - 36.0 g/dL   RDW 13.0 11.5 - 15.5 %   Platelets 247 150 - 400 K/uL     Studies/Results: No results found.  Marland Kitchen alvimopan  12 mg Oral  BID  . bumetanide  0.5 mg Oral Daily  . enoxaparin (LOVENOX) injection  40 mg Subcutaneous Q24H  . lisinopril  20 mg Oral Daily     Assessment/Plan: s/p Procedure(s): LAP ASSISTED ASCENDING COLECTOMY  POD #1.  Laparoscopic assisted right colectomy Suspect early cancer mid transverse colon.  Await pathology  Mobilize to chair and ambulate Clear liquid diet Reduce IV rate Foley out tomorrow entereg Home meds for hypertension  Lovenox for VTE prophylaxis   @PROBHOSP @  LOS: 1 day    Terrance Powell M 09/09/2016  . .prob

## 2016-09-10 MED ORDER — MENTHOL 3 MG MT LOZG
1.0000 | LOZENGE | OROMUCOSAL | Status: DC | PRN
  Administered 2016-09-10: 3 mg via ORAL
  Filled 2016-09-10 (×2): qty 9

## 2016-09-10 MED ORDER — PHENOL 1.4 % MT LIQD
1.0000 | OROMUCOSAL | Status: DC | PRN
  Administered 2016-09-10: 1 via OROMUCOSAL
  Filled 2016-09-10: qty 177

## 2016-09-10 MED ORDER — ACETAMINOPHEN 325 MG PO TABS: 650.0000 mg | ORAL_TABLET | Freq: Four times a day (QID) | ORAL | Status: DC | PRN

## 2016-09-10 MED ORDER — HYDROMORPHONE HCL 1 MG/ML IJ SOLN: 0.5000 mg | INTRAMUSCULAR | Status: DC | PRN

## 2016-09-10 MED ORDER — METOPROLOL TARTRATE 5 MG/5ML IV SOLN: 5.0000 mg | Freq: Four times a day (QID) | INTRAVENOUS | Status: DC | PRN

## 2016-09-10 NOTE — Anesthesia Postprocedure Evaluation (Addendum)
Anesthesia Post Note  Patient: Terrance Powell  Procedure(s) Performed: Procedure(s) (LRB): LAP ASSISTED ASCENDING COLECTOMY (N/A)  Patient location during evaluation: PACU Anesthesia Type: General Level of consciousness: awake and alert Pain management: pain level controlled Vital Signs Assessment: post-procedure vital signs reviewed and stable Respiratory status: spontaneous breathing, nonlabored ventilation, respiratory function stable and patient connected to nasal cannula oxygen Cardiovascular status: blood pressure returned to baseline and stable Postop Assessment: no signs of nausea or vomiting Anesthetic complications: no       Last Vitals:  Vitals:   09/09/16 2042 09/10/16 0626  BP: (!) 164/96 (!) 189/89  Pulse: 78 85  Resp: 16 16  Temp: 37.3 C 36.7 C    Last Pain:  Vitals:   09/10/16 1057  TempSrc:   PainSc: 0-No pain                 Kiah Keay,JAMES TERRILL

## 2016-09-10 NOTE — Progress Notes (Addendum)
2 Days Post-Op  Subjective: Alert and stable. No nausea but a little bit of heartburn and not a lot of appetite. Had 3 small stools plus flatus. Ambulating in halls very well. Pain control very good. No respiratory complaints.  BP 164/96.  On Bumex and lisinopril for essential hypertension.  Heart rate 78.  Good urine output.  Pathology report completed today.  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  This invades the submucosa and.  All margins of resection are negative.  18 benign lymph nodes are present I called Antonietta Barcelona and discussed the pathology report with him.  I told him this was an early stage cancer and probably he would not need any chemotherapy or radiation therapy.  I offered to refer him to a medical oncologist for their opinion as well but we could do that later as an outpatient.  He seems to have good understanding of what I said.  Objective: Vital signs in last 24 hours: Temp:  [98.4 F (36.9 C)-99.2 F (37.3 C)] 99.2 F (37.3 C) (04/26 2042) Pulse Rate:  [78-79] 78 (04/26 2042) Resp:  [16-18] 16 (04/26 2042) BP: (164-170)/(84-96) 164/96 (04/26 2042) SpO2:  [100 %] 100 % (04/26 2042) Last BM Date: 09/09/16  Intake/Output from previous day: 04/26 0701 - 04/27 0700 In: 1064.6 [I.V.:1064.6] Out: 2106 [Urine:2105; Stool:1] Intake/Output this shift: Total I/O In: -  Out: 1500 [Urine:1500]  General appearance: Alert.  No distress.  Mental status normal. Resp: clear to auscultation bilaterally GI: Abdomen soft.  Not distended.  Hypoactive bowel sounds.  Dressings dry. Extremities: extremities normal, atraumatic, no cyanosis or edema  Lab Results:  No results found for this or any previous visit (from the past 24 hour(s)).   Studies/Results: No results found.  Marland Kitchen alvimopan  12 mg Oral BID  . bumetanide  0.5 mg Oral Daily  . enoxaparin (LOVENOX) injection  40 mg Subcutaneous Q24H  . lisinopril  20 mg Oral Daily      Assessment/Plan: s/p Procedure(s): LAP ASSISTED ASCENDING COLECTOMY  POD #2.  Laparoscopic assisted right colectomy Suspect early cancer mid transverse colon.  Pathology discussed with patient today  Increase ambulation Advance to full  liquid diet Reduce IV rate  Foley out now entereg   Elevated BP.  Continue Bumex and lisinopril  .  PRN  Lopressor.   VTE prophylaxis - SCD's and lovenox  Disp:: Advance diet tomorrow. Remove wound wicks Saturday morning Discharge Sunday if doing well Return to office in one week for staple removal   @PROBHOSP @  LOS: 2 days    Masie Bermingham M 09/10/2016  . .prob

## 2016-09-11 ENCOUNTER — Encounter (HOSPITAL_COMMUNITY): Payer: Self-pay | Admitting: Surgery

## 2016-09-11 DIAGNOSIS — Z8739 Personal history of other diseases of the musculoskeletal system and connective tissue: Secondary | ICD-10-CM

## 2016-09-11 DIAGNOSIS — I10 Essential (primary) hypertension: Secondary | ICD-10-CM | POA: Diagnosis present

## 2016-09-11 MED ORDER — LIP MEDEX EX OINT
1.0000 "application " | TOPICAL_OINTMENT | Freq: Two times a day (BID) | CUTANEOUS | Status: DC
  Filled 2016-09-11: qty 7

## 2016-09-11 MED ORDER — MAGIC MOUTHWASH: 15.0000 mL | Freq: Four times a day (QID) | ORAL | Status: DC | PRN

## 2016-09-11 MED ORDER — PSYLLIUM 95 % PO PACK
1.0000 | PACK | Freq: Every day | ORAL | Status: DC
  Filled 2016-09-11 (×2): qty 1

## 2016-09-11 MED ORDER — BLISTEX MEDICATED EX OINT
TOPICAL_OINTMENT | Freq: Two times a day (BID) | CUTANEOUS | Status: DC
  Administered 2016-09-11: 22:00:00 via TOPICAL
  Filled 2016-09-11: qty 6.3

## 2016-09-11 MED ORDER — ALUM & MAG HYDROXIDE-SIMETH 200-200-20 MG/5ML PO SUSP: 30.0000 mL | Freq: Four times a day (QID) | ORAL | Status: DC | PRN

## 2016-09-11 MED ORDER — PROCHLORPERAZINE EDISYLATE 5 MG/ML IJ SOLN: 5.0000 mg | INTRAMUSCULAR | Status: DC | PRN

## 2016-09-11 MED ORDER — WHITE PETROLATUM GEL
Status: AC
  Administered 2016-09-11: 1
  Filled 2016-09-11: qty 1

## 2016-09-11 MED ORDER — BISACODYL 10 MG RE SUPP: 10.0000 mg | Freq: Two times a day (BID) | RECTAL | Status: DC | PRN

## 2016-09-11 NOTE — Progress Notes (Addendum)
[place]  Terrance Powell[address] Terrance Powell, [place] Powell [address] Phone: [phone] FAX: [phone]   [name] [phone] 1935/03/29    Problem List:   Principal Problem:   Carcinoma of transverse colon, T1aN0 s/p colectomy 09/08/2016 Active Problems:   Hypertension   History of gout   3 Days Post-Op  09/08/2016  Date of Surgery:        09/08/2016  Pre op Diagnosis:      Neoplastic mass of proximal transverse colon  Post op Diagnosis:    Same  Procedure:                 Laparoscopic assisted right colectomy  Surgeon:                     Edsel Petrin. Dalbert Batman, M.D., [facility]  Pathologic Staging: pT1a, pN0, pMx Lymph nodes: number examined 18; number positive: 0    Assessment  Improving  Plan:  -adv soilds -stop IVF.  Resume diuresis -pain control -HTN - lisinopril -VTE prophylaxis- SCDs, etc -mobilize as tolerated to help recovery  D/C patient from hospital when patient meets criteria (anticipate in 1 day(s)):  Tolerating oral intake well Ambulating well Adequate pain control without IV medications Urinating  Having flatus Disposition planning in place   Adin Hector, M.D., F.A.C.S. Gastrointestinal and Minimally Invasive Surgery [facility] [address] [place],  [address] [phone] Main / Paging   09/11/2016  CARE TEAM:  PCP: Orpah Melter, MD  Outpatient Care Team: Patient Care Team: Orpah Melter, MD as PCP - General (Family Medicine)  Inpatient Treatment Team: Treatment Team: Attending Provider: [name], MD; Registered Nurse: Jacqulyn Cane Rasing, RN; Technician: Gasper Sells, NT; Student Nurse: Amber Charlie Pitter, RN; Technician: Maye Hides, NT (Inactive); Technician: Gay Filler, NT; Registered Nurse: Ernst Breach, RN; Registered Nurse: Clydia Llano, RN; Technician: Lindajo Royal, NT; Student Nurse: Thalia Party,  Student-RN  Subjective:  Tol fulls Mild sore throat - loz help Walking in room only  Objective:  Vital signs:  Vitals:   09/10/16 0626 09/10/16 1421 09/10/16 2124 09/11/16 0529  BP: (!) 189/89 138/72 (!) 155/79 131/82  Pulse: 85 (!) 101 83 86  Resp: 16 17 18 17   Temp: 98.1 F (36.7 C) 97.8 F (36.6 C) 99.3 F (37.4 C) 98.6 F (37 C)  TempSrc: Oral Oral Oral Oral  SpO2: 98% 100% 97% 99%  Weight:      Height:        Last BM Date: 09/10/16  Intake/Output   Yesterday:  04/27 0701 - 04/28 0700 In: 2633 [P.O.:1270; I.V.:285] Out: 554 [Urine:552; Stool:2] This shift:  Total I/O In: -  Out: 100 [Urine:100]  Bowel function:  Flatus: YES  BM:  YES  Drain: (No drain)   Physical Exam:  General: Pt awake/alert/oriented x4 inno acute distress Eyes: PERRL, normal EOM.  Sclera clear.  No icterus Neuro: CN II-XII intact w/o focal sensory/motor deficits. Lymph: No head/neck/groin lymphadenopathy Psych:  No delerium/psychosis/paranoia HENT: Normocephalic, Mucus membranes moist.  No thrush Neck: Supple, No tracheal deviation Chest: No chest wall pain w good excursion CV:  Pulses intact.  Regular rhythm MS: Normal AROM mjr joints.  No obvious deformity Abdomen: Soft.  Nondistended.  Mildly tender at incisions only.  No evidence of peritonitis.  No incarcerated hernias. Ext:  SCDs BLE.  No mjr edema.  No cyanosis Skin: No petechiae / purpura  Results:   Labs: No results found for this or any previous visit (from the past 48 hour(s)).  Imaging / Studies: No results found.  Medications / Allergies: per chart  Antibiotics: Anti-infectives    Start     Dose/Rate Route Frequency Ordered Stop   09/08/16 2200  cefoTEtan (CEFOTAN) 2 g in dextrose 5 % 50 mL IVPB     2 g 100 mL/hr over 30 Minutes Intravenous Every 12 hours 09/08/16 1501 09/08/16 2257   09/08/16 0905  cefoTEtan in Dextrose 5% (CEFOTAN) 2-2.08 GM-% IVPB    Comments:  Ernesta Amble   : cabinet override       09/08/16 0905 09/08/16 1000   09/08/16 0900  cefoTEtan in Dextrose 5% (CEFOTAN) IVPB 2 g     2 g Intravenous  Once 09/08/16 0859 09/08/16 1000     Diagnosis Colon, segmental resection for tumor, Right ADENOCARCINOMA OF THE COLON, GRADE 2 (2.6 CM) THE TUMOR INVADES SUBMUCOSA (PT1) ALL MARGINS OF RESECTION ARE NEGATIVE FOR CARCINOMA EIGHTEEN BENIGN LYMPH NODES (0/16) UNREMARKABLE APPENDIX Microscopic Comment COLON AND RECTUM (INCLUDING TRANS-ANAL RESECTION): Specimen: Right colon Procedure: Segmental resection Tumor site: Transverse colon Specimen integrity: Intact Macroscopic intactness of mesorectum: Not applicable: x Complete: NA Near complete: NA Incomplete: NA Cannot be determined (specify): NA Macroscopic tumor perforation: The tumor invades the submucosa Invasive tumor: Maximum size: 2.6 cm Histologic type(s): Adenocarcinoma Histologic grade and differentiation: G1: well differentiated/low grade G2: moderately differentiated/low grade G3: poorly differentiated/high grade G4: undifferentiated/high grade Type of polyp in which invasive carcinoma arose: Tubular adenoma 1 of 3 FINAL for Frier, Morton [phone]) Microscopic Comment(continued) Microscopic extension of invasive tumor: invades the submucosa Lymph-Vascular invasion: Not identified Peri-neural invasion: Not identified Tumor deposit(s) (discontinuous extramural extension): Negative Resection margins: Proximal margin: Negative Distal margin: Negative Circumferential (radial) (posterior ascending, posterior descending; lateral and posterior mid-rectum; and entire lower 1/3 rectum): Negative Mesenteric margin (sigmoid and transverse): Negative Distance closest margin (if all above margins negative): 4.0 cm Trans-anal resection margins only: Deep margin: NA Mucosal Margin: NA Distance closest mucosal margin (if negative): NA Treatment effect (neo-adjuvant therapy): Negative Additional polyp(s):  Negative Non-neoplastic findings: Unremarkable Lymph nodes: number examined 18; number positive: 0 Pathologic Staging: pT1a, pN0, pMx Ancillary studies: Ordered Casimer Lanius MD Pathologist, Electronic Signature (Case signed 09/10/2016)   Note: Portions of this report may have been transcribed using voice recognition software. Every effort was made to ensure accuracy; however, inadvertent computerized transcription errors may be present.   Any transcriptional errors that result from this process are unintentional.     Adin Hector, M.D., F.A.C.S. Gastrointestinal and Minimally Invasive Surgery [facility] [address], [place], [place] [address] [phone] Main / Paging   09/11/2016

## 2016-09-12 MED ORDER — HYDROCODONE-ACETAMINOPHEN 5-325 MG PO TABS: 1.0000 | ORAL_TABLET | ORAL | 0 refills | Status: DC | PRN

## 2016-09-12 NOTE — Discharge Summary (Signed)
Physician Discharge Summary  Patient ID: Terrance Powell MRN: [id number] DOB/AGE: Dec 30, 1934  82 y.o.  Admit date: 09/08/2016 Discharge date:   Patient Care Team: Orpah Melter, MD as PCP - General (Family Medicine)  Discharge Diagnoses:  Principal Problem:   Carcinoma of transverse colon, T1aN0 s/p colectomy 09/08/2016 Active Problems:   Hypertension   History of gout   POST-OPERATIVE DIAGNOSIS:   Colon mass  SURGERY:  09/08/2016  Procedure(s): LAP ASSISTED ASCENDING COLECTOMY  SURGEON:    Surgeon(s): [name], MD   Diagnosis Colon, segmental resection for tumor, Right ADENOCARCINOMA OF THE COLON, GRADE 2 (2.6 CM) THE TUMOR INVADES SUBMUCOSA (PT1) ALL MARGINS OF RESECTION ARE NEGATIVE FOR CARCINOMA EIGHTEEN BENIGN LYMPH NODES (0/16) UNREMARKABLE APPENDIX Microscopic Comment COLON AND RECTUM (INCLUDING TRANS-ANAL RESECTION): Specimen: Right colon Procedure: Segmental resection Tumor site: Transverse colon Specimen integrity: Intact Macroscopic intactness of mesorectum: Not applicable: x Complete: NA Near complete: NA Incomplete: NA Cannot be determined (specify): NA Macroscopic tumor perforation: The tumor invades the submucosa Invasive tumor: Maximum size: 2.6 cm Histologic type(s): Adenocarcinoma Histologic grade and differentiation: G1: well differentiated/low grade G2: moderately differentiated/low grade G3: poorly differentiated/high grade G4: undifferentiated/high grade Type of polyp in which invasive carcinoma arose: Tubular adenoma 1 of 3 FINAL for Forero, Maurio ([id number]) Microscopic Comment(continued) Microscopic extension of invasive tumor: invades the submucosa Lymph-Vascular invasion: Not identified Peri-neural invasion: Not identified Tumor deposit(s) (discontinuous extramural extension): Negative Resection margins: Proximal margin: Negative Distal margin: Negative Circumferential (radial) (posterior ascending, posterior  descending; lateral and posterior mid-rectum; and entire lower 1/3 rectum): Negative Mesenteric margin (sigmoid and transverse): Negative Distance closest margin (if all above margins negative): 4.0 cm Trans-anal resection margins only: Deep margin: NA Mucosal Margin: NA Distance closest mucosal margin (if negative): NA Treatment effect (neo-adjuvant therapy): Negative Additional polyp(s): Negative Non-neoplastic findings: Unremarkable Lymph nodes: number examined 18; number positive: 0 Pathologic Staging: pT1a, pN0, pMx Ancillary studies: Richrd Prime MD Pathologist, Electronic Signature (Case signed 09/10/2016) Intraoperative Diagnosis 1. Marithza Malachi OR CONSULT: MASS IS 4 CM TO DISTAL MARGIN. ([facility]) Specimen Lyndall Windt and Clinical Information Specimen(s) Obtained: Colon, segmental resection for tumor, Right Specimen Clinical Information Colon mass (tl) Wilfrido Luedke Specimen: Received fresh labeled extended right colectomy. Specimen integrity: Intact, with two stapled resection margins. Specimen length: There is 5.5 cm of terminal ileum and 21.5 cm of right colon. Mesorectal intactness: N/A Tumor location: Within the distal portion of the specimen along the mesenteric aspect. Tumor size: There is a 2.6 x 1.8 x 0.3 cm tan red, firm, centrally depressed lesion identified with slightly raised borders. Percent of bowel circumference involved: Approximately 40%. Tumor distance to margins: Proximal: 19.0 cm Distal: 4.0 cm Radial (posterior ascending, posterior descending; lateral and posterior mid-rectum; and entire lower 1/3 rectum): 3.5 cm Macroscopic extent of tumor invasion: The tumor invades the submucosa. 2 of 3 FINAL for HUSAK, Orlo [phone]) Kashae Carstens(continued) Total presumed lymph nodes: Twenty tan pink possible lymph nodes are identified, ranging from 0.2 cm to 0.8 cm in greatest dimension. Extramural satellite tumor nodules: None identified. Mucosal polyp(s): Two tan sessile  polyps are identified within the proximal right colon, measuring 0.2 and 0.3 cm in greatest dimension. The polyps measure 9.0 cm proximal to the main lesions. Additional findings: The uninvolved mucosa is tan pink with normal folding, with gray blue discoloration at the distal end of the specimen suggestive of tattoo powder. The appendix is present, and measures 7.5 cm in length x 0.7 cm in diameter. The serosa is tan gray and smooth, the  mucosa is tan, the wall measures 0.3 cm in thickness, and the lumen is pinpoint. Within the adipose tissue, a 2.0 cm in greatest dimension partially cystic indurated area is identified. The cystic portion is filled with yellow greasy material. Block summary: A= proximal resection margin. B,C= distal resection margin. D= two mucosal polyps. E-G= lesion. H= appendix. I= grossly uninvolved mucosa. J,K= representative sections of indurated, cystic adipose tissue. L= five possible lymph nodes. M= four possible lymph nodes. N= four possible lymph nodes. O= four possible lymph nodes. P= three possible lymph nodes. Q= tissue for molecular studies. (KL:gt, 09/09/16) Disclaimer Some of these immunohistochemical stains may have been developed and the performance characteristics determined by [facility]. Some may not have been cleared or approved by the U.S. Food and Drug Administration. The FDA has determined that such clearance or approval is not necessary. This test is used for clinical purposes. It should not be regarded as investigational or for research. This laboratory is certified under the [employer] (CLIA-88) as qualified to perform high complexity clinical laboratory testing. Report signed out from the following location(s) Technical component and interpretation was performed at [facility], [place], [place] [address]. CLIA #: [id number], 3 of  Consults:  None  Hospital Course:   The patient underwent the surgery above.  Postoperatively, the patient gradually mobilized and advanced to a solid diet.  Pain and other symptoms were treated aggressively.    By the time of discharge, the patient was walking well the hallways, eating food, having flatus.  Pain was well-controlled on an oral medications.  Based on meeting discharge criteria and continuing to recover, I felt it was safe for the patient to be discharged from the hospital to further recover with close followup. Postoperative recommendations were discussed in detail.  They are written as well.  Discharged Condition: good  Disposition:  Follow-up Information    Adin Hector, MD. Schedule an appointment as soon as possible for a visit in 1 week(s).   Specialty:  General Surgery Why:  Make an appointment to see Dr. Dalbert Batman or his nurse for staple removal This appointment should be at the in of this coming week or the beginning of the next week Contact information: [address] [place] [id number] [phone]           Final discharge disposition not confirmed  Discharge Instructions    Call MD for:    Complete by:  As directed    FEVER > 101.5 F  (temperatures < 101.5 F are not significant)   Call MD for:  extreme fatigue    Complete by:  As directed    Call MD for:  persistant dizziness or light-headedness    Complete by:  As directed    Call MD for:  persistant nausea and vomiting    Complete by:  As directed    Call MD for:  redness, tenderness, or signs of infection (pain, swelling, redness, odor or green/yellow discharge around incision site)    Complete by:  As directed    Call MD for:  severe uncontrolled pain    Complete by:  As directed    Diet - low sodium heart healthy    Complete by:  As directed    Follow a light diet the first few days at home.   Start with a bland diet such as soups, liquids, starchy foods, low fat foods, etc.   If  you  feel full, bloated, or constipated, stay on a full liquid or pureed/blenderized diet for a few days until you feel better and no longer constipated. Be sure to drink plenty of fluids every day to avoid getting dehydrated (feeling dizzy, not urinating, etc.). Gradually add a fiber supplement to your diet   Discharge instructions    Complete by:  As directed    See Discharge Instructions If you are not getting better after two weeks or are noticing you are getting worse, contact our office [phone] for further advice.  We may need to adjust your medications, re-evaluate you in the office, send you to the emergency room, or see what other things we can do to help. The clinic staff is available to answer your questions during regular business hours (8:30am-5pm).  Please don't hesitate to call and ask to speak to one of our nurses for clinical concerns.    A surgeon from [facility] is always on call at the hospitals 24 hours/day If you have a medical emergency, go to the nearest emergency room or call 911.   Driving Restrictions    Complete by:  As directed    You may drive when you are no longer taking narcotic prescription pain medication, you can comfortably wear a seatbelt, and you can safely make sudden turns/stops to protect yourself without hesitating due to pain.   Increase activity slowly    Complete by:  As directed    Start light daily activities --- self-care, walking, climbing stairs- beginning the day after surgery.  Gradually increase activities as tolerated.  Control your pain to be active.  Stop when you are tired.  Ideally, walk several times a day, eventually an hour a day.   Most people are back to most day-to-day activities in a few weeks.  It takes 4-8 weeks to get back to unrestricted, intense activity. If you can walk 30 minutes without difficulty, it is safe to try more intense activity such as jogging, treadmill, bicycling, low-impact aerobics, swimming,  etc. Save the most intensive and strenuous activity for last (Usually 4-8 weeks after surgery) such as sit-ups, heavy lifting, contact sports, etc.  Refrain from any intense heavy lifting or straining until you are off narcotics for pain control.  You will have off days, but things should improve week-by-week. DO NOT PUSH THROUGH PAIN.  Let pain be your guide: If it hurts to do something, don't do it.  Pain is your body warning you to avoid that activity for another week until the pain goes down.   Lifting restrictions    Complete by:  As directed    If you can walk 30 minutes without difficulty, it is safe to try more intense activity such as jogging, treadmill, bicycling, low-impact aerobics, swimming, etc. Save the most intensive and strenuous activity for last (Usually 4-8 weeks after surgery) such as sit-ups, heavy lifting, contact sports, etc.  Refrain from any intense heavy lifting or straining until you are off narcotics for pain control.  You will have off days, but things should improve week-by-week. DO NOT PUSH THROUGH PAIN.  Let pain be your guide: If it hurts to do something, don't do it.  Pain is your body warning you to avoid that activity for another week until the pain goes down.   May walk up steps    Complete by:  As directed    No dressing needed    Complete by:  As directed  Shower over incisions Plan to come by office to have skin staples remove in ~10days from discharge - call [place] office to arrange [phone]   Sexual Activity Restrictions    Complete by:  As directed    You may have sexual intercourse when it is comfortable. If it hurts to do something, stop.      Allergies as of 09/12/2016      Reactions   Penicillins Diarrhea, Itching, Other (See Comments)   TESTICULAR PAIN Has patient had a PCN reaction causing immediate rash, facial/tongue/throat swelling, SOB or lightheadedness with hypotension: No SEVERE RASH INVOLVING MUCUS MEMBRANES or SKIN NECROSIS: #  #   #  YES  #  #  #  Has patient had a PCN reaction that required hospitalization No Has patient had a PCN reaction occurring within the last 10 years: No.      Medication List    TAKE these medications   bumetanide 0.5 MG tablet Commonly known as:  BUMEX Take 0.5 mg by mouth daily.   HYDROcodone-acetaminophen 5-325 MG tablet Commonly known as:  NORCO/VICODIN Take 1-2 tablets by mouth every 4 (four) hours as needed for moderate pain.   lisinopril 10 MG tablet Commonly known as:  PRINIVIL,ZESTRIL Take 20 mg by mouth daily. Takes 2 tablets   tetrahydrozoline 0.05 % ophthalmic solution Place 1 drop into both eyes daily as needed (dry eyes).       Significant Diagnostic Studies:  No results found for this or any previous visit (from the past 72 hour(s)).  No results found.  Discharge Exam: Blood pressure (!) 153/88, pulse 73, temperature 98.5 F (36.9 C), temperature source Oral, resp. rate 18, height 5\' 10"  (1.778 m), weight 99.3 kg (219 lb), SpO2 98 %.  General: Pt awake/alert/oriented x4 in No acute distress Eyes: PERRL, normal EOM.  Sclera clear.  No icterus Neuro: CN II-XII intact w/o focal sensory/motor deficits. Lymph: No head/neck/groin lymphadenopathy Psych:  No delerium/psychosis/paranoia HENT: Normocephalic, Mucus membranes moist.  No thrush Neck: Supple, No tracheal deviation Chest: No chest wall pain w good excursion CV:  Pulses intact.  Regular rhythm MS: Normal AROM mjr joints.  No obvious deformity Abdomen: Soft.  Nondistended.  Mildly tender at incisions only.  Incisions c/d/I.  No evidence of peritonitis.  No incarcerated hernias. Ext:  SCDs BLE.  No mjr edema.  No cyanosis Skin: No petechiae / purpura  Past Medical History:  Diagnosis Date  . Constipation   . Head injury ~ 1942   loss of consciouss  . History of gout   . Hypertension   . 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 COLECTOMY     LAP ASSISTED ASCENDING COLECTOMY/notes 09/08/2016  . COLONOSCOPY W/ BIOPSIES     "last one was 08/09/2016" (09/08/2016)  . LAPAROSCOPIC RIGHT COLECTOMY N/A 09/08/2016   Procedure: LAP ASSISTED ASCENDING COLECTOMY;  Surgeon: [name], MD;  Location: [place];  Service: General;  Laterality: N/A;    Social History   Social History  . Marital status: Married    Spouse name: N/A  . Number of children: N/A  . Years of education: N/A   Occupational History  . Not on file.   Social History Main Topics  . Smoking status: Former Research scientist (life sciences)  . Smokeless tobacco: Never Used     Comment: "toyed with " none in over 40 years"  . Alcohol use Yes     Comment:  09/08/2016 "might have a couple drinks on holidays"  . Drug use: No  . Sexual activity: Not Currently   Other Topics Concern  . Not on file   Social History Narrative  . No narrative on file    History reviewed. No pertinent family history.  Current Facility-Administered Medications  Medication Dose Route Frequency Provider Last Rate Last Dose  . acetaminophen (TYLENOL) tablet 650 mg  650 mg Oral Q6H PRN [name], MD      . alum & mag hydroxide-simeth (MAALOX/MYLANTA) 200-200-20 MG/5ML suspension 30 mL  30 mL Oral Q6H PRN Michael Boston, MD      . bisacodyl (DULCOLAX) suppository 10 mg  10 mg Rectal Q12H PRN Michael Boston, MD      . bumetanide Cleda Clarks) tablet 0.5 mg  0.5 mg Oral Daily [name], MD   0.5 mg at 09/11/16 1245  . enoxaparin (LOVENOX) injection 40 mg  40 mg Subcutaneous Q24H [name], MD   40 mg at 09/12/16 0809  . HYDROcodone-acetaminophen (NORCO/VICODIN) 5-325 MG per tablet 1-2 tablet  1-2 tablet Oral Q4H PRN [name], MD   1 tablet at 09/10/16 0340  . HYDROmorphone (DILAUDID) injection 0.5 mg  0.5 mg Intravenous Q2H PRN [name], MD      . lip balm (BLISTEX) ointment   Topical BID [name], MD      . lisinopril (PRINIVIL,ZESTRIL) tablet 20 mg  20 mg Oral Daily [name], MD   20 mg at 09/11/16 1246  . magic mouthwash  15 mL Oral QID PRN Michael Boston, MD      . menthol-cetylpyridinium (CEPACOL) lozenge 3 mg  1 lozenge Oral PRN Mickeal Skinner, MD   3 mg at 09/10/16 2150  . metoprolol (LOPRESSOR) injection 5 mg  5 mg Intravenous Q6H PRN [name], MD      . naphazoline-glycerin (CLEAR EYES) ophth solution 2 drop  2 drop Both Eyes QID PRN [name], MD      . ondansetron [facility]) tablet 4 mg  4 mg Oral Q6H PRN [name], MD       Or  . ondansetron [facility]) injection 4 mg  4 mg Intravenous Q6H PRN [name], MD      . phenol (CHLORASEPTIC) mouth spray 1 spray  1 spray Mouth/Throat PRN Mickeal Skinner, MD   1 spray at 09/10/16 2150  . prochlorperazine (COMPAZINE) injection 5-10 mg  5-10 mg Intravenous Q4H PRN Michael Boston, MD      . psyllium (HYDROCIL/METAMUCIL) packet 1 packet  1 packet Oral Daily Michael Boston, MD         Allergies  Allergen Reactions  . Penicillins Diarrhea, Itching and Other (See Comments)    TESTICULAR PAIN  Has patient had a PCN reaction causing immediate rash, facial/tongue/throat swelling, SOB or lightheadedness with hypotension: No SEVERE RASH INVOLVING MUCUS MEMBRANES or SKIN NECROSIS: #  #  #  YES  #  #  #  Has patient had a PCN reaction that required hospitalization No Has patient had a PCN reaction occurring within the last 10 years: No.     Signed: Morton Peters, M.D., F.A.C.S. Gastrointestinal and Minimally Invasive Surgery [facility] [address], [place], [place] [address] [phone] Main / Paging   09/12/2016, 9:23 AM

## 2016-09-12 NOTE — Progress Notes (Signed)
Pt discharged to home accompanied by daughter. DC instructions given and reviewed. Rx given and reviewed. Pt had no equipment needs for home and was ambulating well at time of DC.

## 2016-09-12 NOTE — Discharge Instructions (Signed)
SURGERY: POST OP INSTRUCTIONS °(Surgery for small bowel obstruction, colon resection, etc) ° ° °###################################################################### ° °EAT °Gradually transition to a high fiber diet with a fiber supplement over the next few days after discharge ° °WALK °Walk an hour a day.  Control your pain to do that.   ° °CONTROL PAIN °Control pain so that you can walk, sleep, tolerate sneezing/coughing, go up/down stairs. ° °HAVE A BOWEL MOVEMENT DAILY °Keep your bowels regular to avoid problems.  OK to try a laxative to override constipation.  OK to use an antidairrheal to slow down diarrhea.  Call if not better after 2 tries ° °CALL IF YOU HAVE PROBLEMS/CONCERNS °Call if you are still struggling despite following these instructions. °Call if you have concerns not answered by these instructions ° °###################################################################### ° ° °DIET °Follow a light diet the first few days at home.  Start with a bland diet such as soups, liquids, starchy foods, low fat foods, etc.  If you feel full, bloated, or constipated, stay on a ful liquid or pureed/blenderized diet for a few days until you feel better and no longer constipated. °Be sure to drink plenty of fluids every day to avoid getting dehydrated (feeling dizzy, not urinating, etc.). °Gradually add a fiber supplement to your diet over the next week.  Gradually get back to a regular solid diet.  Avoid fast food or heavy meals the first week as you are more likely to get nauseated. °It is expected for your digestive tract to need a few months to get back to normal.  It is common for your bowel movements and stools to be irregular.  You will have occasional bloating and cramping that should eventually fade away.  Until you are eating solid food normally, off all pain medications, and back to regular activities; your bowels will not be normal. °Focus on eating a low-fat, high fiber diet the rest of your life  (See Getting to Good Bowel Health, below). ° °CARE of your INCISION or WOUND °It is good for closed incision and even open wounds to be washed every day.  Shower every day.  Short baths are fine.  Wash the incisions and wounds clean with soap & water.    °If you have a closed incision(s), wash the incision with soap & water every day.  You may leave closed incisions open to air if it is dry.   You may cover the incision with clean gauze & replace it after your daily shower for comfort. °If you have skin tapes (Steristrips) or skin glue (Dermabond) on your incision, leave them in place.  They will fall off on their own like a scab.  You may trim any edges that curl up with clean scissors.  If you have staples, set up an appointment for them to be removed in the office in 10 days after surgery.  °If you have a drain, wash around the skin exit site with soap & water and place a new dressing of gauze or band aid around the skin every day.  Keep the drain site clean & dry.    °If you have an open wound with packing, see wound care instructions.  In general, it is encouraged that you remove your dressing and packing, shower with soap & water, and replace your dressing once a day.  Pack the wound with clean gauze moistened with normal (0.9%) saline to keep the wound moist & uninfected.  Pressure on the dressing for 30 minutes will stop most wound   bleeding.  Eventually your body will heal & pull the open wound closed over the next few months.  °Raw open wounds will occasionally bleed or secrete yellow drainage until it heals closed.  Drain sites will drain a little until the drain is removed.  Even closed incisions can have mild bleeding or drainage the first few days until the skin edges scab over & seal.   °If you have an open wound with a wound vac, see wound vac care instructions. ° ° ° ° °ACTIVITIES as tolerated °Start light daily activities --- self-care, walking, climbing stairs-- beginning the day after surgery.   Gradually increase activities as tolerated.  Control your pain to be active.  Stop when you are tired.  Ideally, walk several times a day, eventually an hour a day.   °Most people are back to most day-to-day activities in a few weeks.  It takes 4-8 weeks to get back to unrestricted, intense activity. °If you can walk 30 minutes without difficulty, it is safe to try more intense activity such as jogging, treadmill, bicycling, low-impact aerobics, swimming, etc. °Save the most intensive and strenuous activity for last (Usually 4-8 weeks after surgery) such as sit-ups, heavy lifting, contact sports, etc.  Refrain from any intense heavy lifting or straining until you are off narcotics for pain control.  You will have off days, but things should improve week-by-week. °DO NOT PUSH THROUGH PAIN.  Let pain be your guide: If it hurts to do something, don't do it.  Pain is your body warning you to avoid that activity for another week until the pain goes down. °You may drive when you are no longer taking narcotic prescription pain medication, you can comfortably wear a seatbelt, and you can safely make sudden turns/stops to protect yourself without hesitating due to pain. °You may have sexual intercourse when it is comfortable. If it hurts to do something, stop. ° °MEDICATIONS °Take your usually prescribed home medications unless otherwise directed.   °Blood thinners:  °Usually you can restart any strong blood thinners after the second postoperative day.  It is OK to take aspirin right away.    ° If you are on strong blood thinners (warfarin/Coumadin, Plavix, Xerelto, Eliquis, Pradaxa, etc), discuss with your surgeon, medicine PCP, and/or cardiologist for instructions on when to restart the blood thinner & if blood monitoring is needed (PT/INR blood check, etc).   ° ° °PAIN CONTROL °Pain after surgery or related to activity is often due to strain/injury to muscle, tendon, nerves and/or incisions.  This pain is usually  short-term and will improve in a few months.  °To help speed the process of healing and to get back to regular activity more quickly, DO THE FOLLOWING THINGS TOGETHER: °1. Increase activity gradually.  DO NOT PUSH THROUGH PAIN °2. Use Ice and/or Heat °3. Try Gentle Massage and/or Stretching °4. Take over the counter pain medication °5. Take Narcotic prescription pain medication for more severe pain ° °Good pain control = faster recovery.  It is better to take more medicine to be more active than to stay in bed all day to avoid medications. °1.  Increase activity gradually °Avoid heavy lifting at first, then increase to lifting as tolerated over the next 6 weeks. °Do not “push through” the pain.  Listen to your body and avoid positions and maneuvers than reproduce the pain.  Wait a few days before trying something more intense °Walking an hour a day is encouraged to help your body recover faster   and more safely.  Start slowly and stop when getting sore.  If you can walk 30 minutes without stopping or pain, you can try more intense activity (running, jogging, aerobics, cycling, swimming, treadmill, sex, sports, weightlifting, etc.) °Remember: If it hurts to do it, then don’t do it! °2. Use Ice and/or Heat °You will have swelling and bruising around the incisions.  This will take several weeks to resolve. °Ice packs or heating pads (6-8 times a day, 30-60 minutes at a time) will help sooth soreness & bruising. °Some people prefer to use ice alone, heat alone, or alternate between ice & heat.  Experiment and see what works best for you.  Consider trying ice for the first few days to help decrease swelling and bruising; then, switch to heat to help relax sore spots and speed recovery. °Shower every day.  Short baths are fine.  It feels good!  Keep the incisions and wounds clean with soap & water.   °3. Try Gentle Massage and/or Stretching °Massage at the area of pain many times a day °Stop if you feel pain - do not  overdo it °4. Take over the counter pain medication °This helps the muscle and nerve tissues become less irritable and calm down faster °Choose ONE of the following over-the-counter anti-inflammatory medications: °Acetaminophen 500mg tabs (Tylenol) 1-2 pills with every meal and just before bedtime (avoid if you have liver problems or if you have acetaminophen in you narcotic prescription) °Naproxen 220mg tabs (ex. Aleve, Naprosyn) 1-2 pills twice a day (avoid if you have kidney, stomach, IBD, or bleeding problems) °Ibuprofen 200mg tabs (ex. Advil, Motrin) 3-4 pills with every meal and just before bedtime (avoid if you have kidney, stomach, IBD, or bleeding problems) °Take with food/snack several times a day as directed for at least 2 weeks to help keep pain / soreness down & more manageable. °5. Take Narcotic prescription pain medication for more severe pain °A prescription for strong pain control is often given to you upon discharge (for example: oxycodone/Percocet, hydrocodone/Norco/Vicodin, or tramadol/Ultram) °Take your pain medication as prescribed. °Be mindful that most narcotic prescriptions contain Tylenol (acetaminophen) as well - avoid taking too much Tylenol. °If you are having problems/concerns with the prescription medicine (does not control pain, nausea, vomiting, rash, itching, etc.), please call us [phone] to see if we need to switch you to a different pain medicine that will work better for you and/or control your side effects better. °If you need a refill on your pain medication, you must call the office before 4 pm and on weekdays only.  By federal law, prescriptions for narcotics cannot be called into a pharmacy.  They must be filled out on paper & picked up from our office by the patient or authorized caretaker.  Prescriptions cannot be filled after 4 pm nor on weekends.   ° °WHEN TO CALL US [phone] °Severe uncontrolled or worsening pain  °Fever over 101 F (38.5 C) °Concerns with  the incision: Worsening pain, redness, rash/hives, swelling, bleeding, or drainage °Reactions / problems with new medications (itching, rash, hives, nausea, etc.) °Nausea and/or vomiting °Difficulty urinating °Difficulty breathing °Worsening fatigue, dizziness, lightheadedness, blurred vision °Other concerns °If you are not getting better after two weeks or are noticing you are getting worse, contact our office [phone] for further advice.  We may need to adjust your medications, re-evaluate you in the office, send you to the emergency room, or see what other things we can do to help. °The   clinic staff is available to answer your questions during regular business hours (8:30am-5pm).  Please don’t hesitate to call and ask to speak to one of our nurses for clinical concerns.    °A surgeon from [facility] is always on call at the hospitals 24 hours/day °If you have a medical emergency, go to the nearest emergency room or call 911. ° °FOLLOW UP in our office °One the day of your discharge from the hospital (or the next business weekday), please call [facility] to set up or confirm an appointment to see your surgeon in the office for a follow-up appointment.  Usually it is 2-3 weeks after your surgery.   °If you have skin staples at your incision(s), let the office know so we can set up a time in the office for the nurse to remove them (usually around 10 days after surgery). °Make sure that you call for appointments the day of discharge (or the next business weekday) from the hospital to ensure a convenient appointment time. °IF YOU HAVE DISABILITY OR FAMILY LEAVE FORMS, BRING THEM TO THE OFFICE FOR PROCESSING.  DO NOT GIVE THEM TO YOUR DOCTOR. ° °[facility] °[address], [address], [place],   [address] ? °[phone] - Main °[phone] - Toll Free,  [phone] - Fax °[email or web address] ° °GETTING TO GOOD BOWEL HEALTH. °It is  expected for your digestive tract to need a few months to get back to normal.  It is common for your bowel movements and stools to be irregular.  You will have occasional bloating and cramping that should eventually fade away.  Until you are eating solid food normally, off all pain medications, and back to regular activities; your bowels will not be normal.   °Avoiding constipation °The goal: ONE SOFT BOWEL MOVEMENT A DAY!    °Drink plenty of fluids.  Choose water first. °TAKE A FIBER SUPPLEMENT EVERY DAY THE REST OF YOUR LIFE °During your first week back home, gradually add back a fiber supplement every day °Experiment which form you can tolerate.   There are many forms such as powders, tablets, wafers, gummies, etc °Psyllium bran (Metamucil), methylcellulose (Citrucel), Miralax or Glycolax, Benefiber, Flax Seed.  °Adjust the dose week-by-week (1/2 dose/day to 6 doses a day) until you are moving your bowels 1-2 times a day.  Cut back the dose or try a different fiber product if it is giving you problems such as diarrhea or bloating. °Sometimes a laxative is needed to help jump-start bowels if constipated until the fiber supplement can help regulate your bowels.  If you are tolerating eating & you are farting, it is okay to try a gentle laxative such as double dose MiraLax, prune juice, or Milk of Magnesia.  Avoid using laxatives too often. °Stool softeners can sometimes help counteract the constipating effects of narcotic pain medicines.  It can also cause diarrhea, so avoid using for too long. °If you are still constipated despite taking fiber daily, eating solids, and a few doses of laxatives, call our office. °Controlling diarrhea °Try drinking liquids and eating bland foods for a few days to avoid stressing your intestines further. °Avoid dairy products (especially milk & ice cream) for a short time.  The intestines often can lose the ability to digest lactose when stressed. °Avoid foods that cause gassiness or  bloating.  Typical foods include beans and other legumes, cabbage, broccoli, and dairy foods.  Avoid greasy, spicy, fast foods.  Every person has   some sensitivity to other foods, so listen to your body and avoid those foods that trigger problems for you. Probiotics (such as active yogurt, Align, etc) may help repopulate the intestines and colon with normal bacteria and calm down a sensitive digestive tract Adding a fiber supplement gradually can help thicken stools by absorbing excess fluid and retrain the intestines to act more normally.  Slowly increase the dose over a few weeks.  Too much fiber too soon can backfire and cause cramping & bloating. It is okay to try and slow down diarrhea with a few doses of antidiarrheal medicines.   Bismuth subsalicylate (ex. Kayopectate, Pepto Bismol) for a few doses can help control diarrhea.  Avoid if pregnant.   Loperamide (Imodium) can slow down diarrhea.  Start with one tablet (2mg ) first.  Avoid if you are having fevers or severe pain.  ILEOSTOMY PATIENTS WILL HAVE CHRONIC DIARRHEA since their colon is not in use.    Drink plenty of liquids.  You will need to drink even more glasses of water/liquid a day to avoid getting dehydrated. Record output from your ileostomy.  Expect to empty the bag every 3-4 hours at first.  Most people with a permanent ileostomy empty their bag 4-6 times at the least.   Use antidiarrheal medicine (especially Imodium) several times a day to avoid getting dehydrated.  Start with a dose at bedtime & breakfast.  Adjust up or down as needed.  Increase antidiarrheal medications as directed to avoid emptying the bag more than 8 times a day (every 3 hours). Work with your wound ostomy nurse to learn care for your ostomy.  See ostomy care instructions. TROUBLESHOOTING IRREGULAR BOWELS 1) Start with a soft & bland diet. No spicy, greasy, or fried foods.  2) Avoid gluten/wheat or dairy products from diet to see if symptoms improve. 3) Miralax  17gm or flax seed mixed in [place]. water or juice-daily. May use 2-4 times a day as needed. 4) Gas-X, Phazyme, etc. as needed for gas & bloating.  5) Prilosec (omeprazole) over-the-counter as needed 6)  Consider probiotics (Align, Activa, etc) to help calm the bowels down  Call your doctor if you are getting worse or not getting better.  Sometimes further testing (cultures, endoscopy, X-ray studies, CT scans, bloodwork, etc.) may be needed to help diagnose and treat the cause of the diarrhea. [facility], [address], [place], [place]  [address] [phone] - Main.    [phone]  - Toll Free.   [phone] - Fax [email or web address]  GETTING TO GOOD BOWEL HEALTH.  ######################################################################  EAT Gradually transition to a high fiber diet with a fiber supplement over the next few weeks after discharge.  Start with a pureed / full liquid diet (see below)  WALK Walk an hour a day.  Control your pain to do that.    HAVE A BOWEL MOVEMENT DAILY Keep your bowels regular to avoid problems.  OK to try a laxative to override constipation.  OK to use an antidairrheal to slow down diarrhea.  Call if not better after 2 tries  CALL IF YOU HAVE PROBLEMS/CONCERNS Call if you are still struggling despite following these instructions. Call if you have concerns not answered by these instructions  ######################################################################   Irregular bowel habits such as constipation and diarrhea can lead to many problems over time.  Having one soft bowel movement a day is the most important way to prevent further problems.  The anorectal canal is designed  to handle stretching and feces to safely manage our ability to get rid of solid waste (feces, poop, stool) out of our body.  BUT, hard constipated stools can act like ripping concrete bricks and diarrhea can be a burning fire to  this very sensitive area of our body, causing inflamed hemorrhoids, anal fissures, increasing risk is perirectal abscesses, abdominal pain/bloating, an making irritable bowel worse.      The goal: ONE SOFT BOWEL MOVEMENT A DAY!  To have soft, regular bowel movements:   Drink plenty of fluids, consider 4-6 tall glasses of water a day.    Take plenty of fiber.  Fiber is the undigested part of plant food that passes into the colon, acting s natures broom to encourage bowel motility and movement.  Fiber can absorb and hold large amounts of water. This results in a larger, bulkier stool, which is soft and easier to pass. Work gradually over several weeks up to 6 servings a day of fiber (25g a day even more if needed) in the form of: o Vegetables -- Root (potatoes, carrots, turnips), leafy green (lettuce, salad greens, celery, spinach), or cooked high residue (cabbage, broccoli, etc) o Fruit -- Fresh (unpeeled skin & pulp), Dried (prunes, apricots, cherries, etc ),  or stewed ( applesauce)  o Whole grain breads, pasta, etc (whole wheat)  o Bran cereals   Bulking Agents -- This type of water-retaining fiber generally is easily obtained each day by one of the following:  o Psyllium bran -- The psyllium plant is remarkable because its ground seeds can retain so much water. This product is available as Metamucil, Konsyl, Effersyllium, Per Diem Fiber, or the less expensive generic preparation in drug and health food stores. Although labeled a laxative, it really is not a laxative.  o Methylcellulose -- This is another fiber derived from wood which also retains water. It is available as Citrucel. o Polyethylene Glycol - and artificial fiber commonly called Miralax or Glycolax.  It is helpful for people with gassy or bloated feelings with regular fiber o Flax Seed - a less gassy fiber than psyllium  No reading or other relaxing activity while on the toilet. If bowel movements take longer than 5 minutes, you  are too constipated  AVOID CONSTIPATION.  High fiber and water intake usually takes care of this.  Sometimes a laxative is needed to stimulate more frequent bowel movements, but   Laxatives are not a good long-term solution as it can wear the colon out.  They can help jump-start bowels if constipated, but should be relied on constantly without discussing with your doctor o Osmotics (Milk of Magnesia, Fleets phosphosoda, Magnesium citrate, MiraLax, GoLytely) are safer than  o Stimulants (Senokot, Castor Oil, Dulcolax, Ex Lax)    o Avoid taking laxatives for more than 7 days in a row.   IF SEVERELY CONSTIPATED, try a Bowel Retraining Program: o Do not use laxatives.  o Eat a diet high in roughage, such as bran cereals and leafy vegetables.  o Drink six (6) ounces of prune or apricot juice each morning.  o Eat two (2) large servings of stewed fruit each day.  o Take one (1) heaping tablespoon of a psyllium-based bulking agent twice a day. Use sugar-free sweetener when possible to avoid excessive calories.  o Eat a normal breakfast.  o Set aside 15 minutes after breakfast to sit on the toilet, but do not strain to have a bowel movement.  o If you do not have  a bowel movement by the third day, use an enema and repeat the above steps.   Controlling diarrhea o Switch to liquids and simpler foods for a few days to avoid stressing your intestines further. o Avoid dairy products (especially milk & ice cream) for a short time.  The intestines often can lose the ability to digest lactose when stressed. o Avoid foods that cause gassiness or bloating.  Typical foods include beans and other legumes, cabbage, broccoli, and dairy foods.  Every person has some sensitivity to other foods, so listen to our body and avoid those foods that trigger problems for you. o Adding fiber (Citrucel, Metamucil, psyllium, Miralax) gradually can help thicken stools by absorbing excess fluid and retrain the intestines to act  more normally.  Slowly increase the dose over a few weeks.  Too much fiber too soon can backfire and cause cramping & bloating. o Probiotics (such as active yogurt, Align, etc) may help repopulate the intestines and colon with normal bacteria and calm down a sensitive digestive tract.  Most studies show it to be of mild help, though, and such products can be costly. o Medicines: - Bismuth subsalicylate (ex. Kayopectate, Pepto Bismol) every 30 minutes for up to 6 doses can help control diarrhea.  Avoid if pregnant. - Loperamide (Immodium) can slow down diarrhea.  Start with two tablets (4mg  total) first and then try one tablet every 6 hours.  Avoid if you are having fevers or severe pain.  If you are not better or start feeling worse, stop all medicines and call your doctor for advice o Call your doctor if you are getting worse or not better.  Sometimes further testing (cultures, endoscopy, X-ray studies, bloodwork, etc) may be needed to help diagnose and treat the cause of the diarrhea.  TROUBLESHOOTING IRREGULAR BOWELS 1) Avoid extremes of bowel movements (no bad constipation/diarrhea) 2) Miralax 17gm mixed in 8oz. water or juice-daily. May use BID as needed.  3) Gas-x,Phazyme, etc. as needed for gas & bloating.  4) Soft,bland diet. No spicy,greasy,fried foods.  5) Prilosec over-the-counter as needed  6) May hold gluten/wheat products from diet to see if symptoms improve.  7)  May try probiotics (Align, Activa, etc) to help calm the bowels down 7) If symptoms become worse call back immediately.   Colorectal Cancer Colorectal cancer is an abnormal growth of tissue (tumor) in the colon or rectum that is cancerous (malignant). Unlike noncancerous (benign) tumors, malignant tumors can spread to other parts of your body. The colon is the large bowel or large intestine. The rectum is the last several inches of the colon. What increases the risk? The exact cause of colorectal cancer is unknown.  However, the following factors may increase your chances of getting colorectal cancer:  Age older than 37 years.  Abnormal growths (polyps) on the inner wall of the colon or rectum.  Diabetes.  African American race.  Family history of hereditary nonpolyposis colorectal cancer. This condition is caused by changes in the genes that are responsible for repairing mismatched DNA.  Personal history of cancer. A person who has already had colorectal cancer may develop it a second time. Also, women with a history of ovarian, uterine, or breast cancer are at a somewhat higher risk of developing colorectal cancer.  Certain hereditary conditions.  Eating a diet that is high in fat (especially animal fat) and low in fiber, fruits, and vegetables.  Sedentary lifestyle.  Inflammatory bowel disease, including ulcerative colitis and Crohn's disease.  Smoking.  Excessive alcohol use. What are the signs or symptoms? Early colorectal cancer often does not cause symptoms. As the cancer grows, symptoms may include:  Changes in bowel habits.  Diarrhea.  Constipation.  Feeling like the bowel does not empty completely after a bowel movement.  Blood in the stool.  Stools that are narrower than usual.  Abdominal discomfort, pain, bloating, fullness, or cramps.  Frequent gas pain.  Unexplained weight loss.  Constant tiredness.  Nausea and vomiting. How is this diagnosed? Your health care provider will ask about your medical history. He or she may also perform a number of procedures, such as:  A physical exam.  A digital rectal exam.  A fecal occult blood test.  A barium enema.  Blood tests.  X-rays.  Imaging tests, such as CT scans or MRIs.  Taking a tissue sample (biopsy) from your colon or rectum to look for cancer cells.  A sigmoidoscopy to view the inside of the last part of your colon.  A colonoscopy to view the inside of your entire colon.  An endorectal ultrasound  to see how deep a rectal tumor has grown and whether the cancer has spread to lymph nodes or other nearby tissues. Your cancer will be staged to determine its severity and extent. Staging is a careful attempt to find out the size of the tumor, whether the cancer has spread, and if so, to what parts of the body. You may need to have more tests to determine the stage of your cancer. The test results will help determine what treatment plan is best for you.  Stage 0. The cancer is found only in the innermost lining of the colon or rectum.  Stage I. The cancer has grown into the inner wall of the colon or rectum. The cancer has not yet reached the outer wall of the colon.  Stage II. The cancer extends more deeply into or through the wall of the colon or rectum. It may have invaded nearby tissue, but cancer cells have not spread to the lymph nodes.  Stage III. The cancer has spread to nearby lymph nodes but not to other parts of the body.  Stage IV. The cancer has spread to other parts of the body, such as the liver or lungs. Your health care provider may tell you the detailed stage of your cancer, which includes both a number and a letter. How is this treated? Depending on the type and stage, colorectal cancer may be treated with surgery, radiation therapy, chemotherapy, targeted therapy, or radiofrequency ablation. Some people have a combination of these therapies. Surgery may be done to remove the polyps from your colon. In early stages, your health care provider may be able to do this during a colonoscopy. In later stages, surgery may be done to remove part of your colon. Follow these instructions at home:  Take medicines only as directed by your health care provider.  Maintain a healthy diet.  Consider joining a support group. This may help you learn to cope with the stress of having colorectal cancer.  Seek advice to help you manage treatment of side effects.  Keep all follow-up visits as  directed by your health care provider.  Inform your cancer specialist if you are admitted to the hospital. Contact a health care provider if:  Your diarrhea or constipation does not go away.  Your bowel habits change.  You have increased abdominal pain.  You notice new fatigue or weakness.  You lose weight. This information  is not intended to replace advice given to you by your health care provider. Make sure you discuss any questions you have with your health care provider. Document Released: 05/03/2005 Document Revised: 10/09/2015 Document Reviewed: 10/26/2012 Elsevier Interactive Patient Education  2017 [employer].

## 2016-10-15 NOTE — Addendum Note (Signed)
Addendum  created 10/15/16 1419 by Hazle Ogburn, MD   Sign clinical note    

## 2017-09-18 ENCOUNTER — Other Ambulatory Visit: Payer: Self-pay | Admitting: General Surgery

## 2017-09-18 DIAGNOSIS — C183 Malignant neoplasm of hepatic flexure: Secondary | ICD-10-CM

## 2017-09-23 ENCOUNTER — Ambulatory Visit
Admission: RE | Admit: 2017-09-23 | Discharge: 2017-09-23 | Disposition: A | Discharge status: Home / Self Care | Payer: Medicare Other | Source: Ambulatory Visit | Attending: General Surgery | Admitting: General Surgery

## 2017-09-23 DIAGNOSIS — C183 Malignant neoplasm of hepatic flexure: Secondary | ICD-10-CM

## 2017-09-23 MED ORDER — IOPAMIDOL (ISOVUE-300) INJECTION 61%
125.0000 mL | Freq: Once | INTRAVENOUS | Status: AC | PRN
  Administered 2017-09-23: 125 mL via INTRAVENOUS

## 2017-09-23 MED ORDER — IOPAMIDOL (ISOVUE-300) INJECTION 61%: 100.0000 mL | Freq: Once | INTRAVENOUS | Status: DC | PRN

## 2018-08-23 ENCOUNTER — Other Ambulatory Visit: Payer: Self-pay | Admitting: General Surgery

## 2018-08-23 DIAGNOSIS — C183 Malignant neoplasm of hepatic flexure: Secondary | ICD-10-CM

## 2018-10-16 ENCOUNTER — Other Ambulatory Visit: Payer: Self-pay

## 2018-10-16 ENCOUNTER — Ambulatory Visit
Admission: RE | Admit: 2018-10-16 | Discharge: 2018-10-16 | Disposition: A | Discharge status: Home / Self Care | Payer: Medicare Other | Source: Ambulatory Visit | Attending: General Surgery | Admitting: General Surgery

## 2018-10-16 DIAGNOSIS — C183 Malignant neoplasm of hepatic flexure: Secondary | ICD-10-CM

## 2018-10-16 MED ORDER — IOPAMIDOL (ISOVUE-300) INJECTION 61%
100.0000 mL | Freq: Once | INTRAVENOUS | Status: AC | PRN
  Administered 2018-10-16: 100 mL via INTRAVENOUS

## 2020-10-02 ENCOUNTER — Other Ambulatory Visit: Payer: Self-pay | Admitting: Family Medicine

## 2020-10-02 DIAGNOSIS — Q839 Congenital malformation of breast, unspecified: Secondary | ICD-10-CM

## 2020-11-24 ENCOUNTER — Other Ambulatory Visit: Payer: Self-pay | Admitting: Family Medicine

## 2020-11-24 ENCOUNTER — Ambulatory Visit
Admission: RE | Admit: 2020-11-24 | Discharge: 2020-11-24 | Disposition: A | Discharge status: Home / Self Care | Payer: Medicare Other | Source: Ambulatory Visit | Attending: Family Medicine | Admitting: Family Medicine

## 2020-11-24 ENCOUNTER — Other Ambulatory Visit: Payer: Self-pay

## 2020-11-24 DIAGNOSIS — Q839 Congenital malformation of breast, unspecified: Secondary | ICD-10-CM

## 2020-11-25 ENCOUNTER — Other Ambulatory Visit: Payer: Self-pay | Admitting: Surgery

## 2020-11-28 ENCOUNTER — Other Ambulatory Visit: Payer: Self-pay | Admitting: Family Medicine

## 2020-11-28 ENCOUNTER — Other Ambulatory Visit: Payer: Self-pay

## 2020-11-28 ENCOUNTER — Ambulatory Visit
Admission: RE | Admit: 2020-11-28 | Discharge: 2020-11-28 | Disposition: A | Discharge status: Home / Self Care | Payer: Medicare Other | Source: Ambulatory Visit | Attending: Family Medicine | Admitting: Family Medicine

## 2020-11-28 DIAGNOSIS — Q839 Congenital malformation of breast, unspecified: Secondary | ICD-10-CM

## 2020-12-02 ENCOUNTER — Other Ambulatory Visit: Payer: Self-pay | Admitting: Surgery

## 2020-12-04 ENCOUNTER — Telehealth: Payer: Self-pay | Admitting: Oncology

## 2020-12-04 NOTE — Telephone Encounter (Signed)
Scheduled appt per referral. Pt aware.  

## 2020-12-05 ENCOUNTER — Other Ambulatory Visit: Payer: Self-pay | Admitting: Surgery

## 2020-12-10 ENCOUNTER — Other Ambulatory Visit: Payer: Self-pay | Admitting: Surgery

## 2020-12-12 ENCOUNTER — Other Ambulatory Visit: Payer: Self-pay | Admitting: Surgery

## 2020-12-12 DIAGNOSIS — Z853 Personal history of malignant neoplasm of breast: Secondary | ICD-10-CM

## 2020-12-12 NOTE — Progress Notes (Signed)
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 He underwent bilateral diagnostic mammography with tomography and right breast ultrasonography at [facility] on 11/24/2020 showing: breast density category B; 3.4 cm retroareolar right breast mass, contiguous with skin of nipple; no abnormal right axillary lymph nodes; benign left breast gynecomastia, no evidence left breast malignancy.  Accordingly on 11/28/2020, he proceeded to biopsy of the right breast area in question. The pathology from this procedure ([id number]) showed: invasive mammary carcinoma, e-cadherin positive, grade 1-2. Prognostic indicators significant for: estrogen receptor, >95% positive with strong staining intensity and progesterone receptor, 0% negative. Proliferation marker Ki67 at 15%. HER2 equivocal by immunohistochemistry (2+), but positive by fluorescent in situ hybridization with a signals ratio 2.36 and number per cell 5.21.  The patient's subsequent history is as detailed below.   INTERVAL HISTORY: Terrance Powell was evaluated in the breast cancer clinic on 12/15/2020.  His case was also presented at the multidisciplinary breast cancer conference 12/10/2020.  At that time a preliminary plan was proposed: Genetics, mastectomy, repeat prognostic profile on the final sample, consideration of  adjuvant postmastectomy radiation depending on final stage (not if T2, yes if T4)  He is scheduled for mastectomy on 12/29/2020 under Dr. Ninfa Linden.   REVIEW OF SYSTEMS: The patient denies unusual headaches, visual changes, nausea, vomiting, stiff neck, dizziness, or gait imbalance. There has been no cough, phlegm production, or pleurisy, no chest pain or pressure, and no change in bowel or bladder habits. The patient denies fever, rash, bleeding, unexplained fatigue or unexplained weight loss.  He does not exercise regularly.  A detailed review of systems was otherwise entirely negative.   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    PAST SURGICAL HISTORY: Past Surgical History:  Procedure Laterality Date   COLECTOMY     LAP ASSISTED ASCENDING COLECTOMY/notes 09/08/2016   COLONOSCOPY W/ BIOPSIES     "last one was 08/09/2016" (09/08/2016)   LAPAROSCOPIC RIGHT COLECTOMY N/A 09/08/2016   Procedure: LAP ASSISTED ASCENDING COLECTOMY;  Surgeon: [name], MD;  Location: [facility];  Service: General;  Laterality: N/A;    FAMILY HISTORY: Family History  Problem Relation Age of Onset   Breast cancer Mother    Colon cancer Father        metastatic   Breast cancer Maternal Aunt        3 mat aunts all in 60-70s   Colon cancer Other    His father died at age 53 from metastatic colon cancer. He also reports ovarian cancer in his paternal grandmother.  The patient's father had 7 brothers and 2 sisters 1 brother had esophageal cancer and the other 1 had cancer of  a type unknown to the patient.--On the maternal side the patient's mother died at the age of 59 with breast cancer.  She had 4 sisters some of whom had cancer but he is not sure what type.  His  mother had 3 brothers none of whom had cancer.  He is also not aware of any cancer in any cousins on either side.  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 His wife Terrance Powell worked as a Primary school teacher for a time.  Their daughter Terrance Powell lives in [place] where she works for the [employer].  Their daughter Terrance Powell lives in [place] and works as a Dietitian.  The patient has 4 grandchildren.  He is not a church attender    ADVANCED DIRECTIVES: The patient's wife is his healthcare power of attorney   HEALTH MAINTENANCE: Social History   Tobacco Use   Smoking status: Former   Smokeless tobacco: Never   Tobacco comments:    "toyed with " none in over 40 years"  Vaping Use   Vaping Use: Never used  Substance Use Topics   Alcohol use: Yes    Comment: 09/08/2016 "might have a couple drinks on holidays"   Drug use: No     Colonoscopy: 2018, adenocarcinoma  PSA:   Bone density:    Allergies  Allergen Reactions   Penicillins Diarrhea, Itching and Other (See Comments)    TESTICULAR PAIN  Has patient had a PCN reaction causing immediate rash, facial/tongue/throat swelling, SOB or lightheadedness with hypotension: No SEVERE RASH INVOLVING MUCUS MEMBRANES or SKIN NECROSIS: #  #  #  YES  #  #  #  Has patient had a PCN reaction that required hospitalization No Has patient had a PCN reaction occurring within the last 10 years: No.     Current Outpatient Medications  Medication Sig Dispense Refill   bumetanide (BUMEX) 0.5 MG tablet Take 0.5 mg by mouth daily.     lisinopril (PRINIVIL,ZESTRIL) 10 MG tablet Take 20 mg by mouth daily. Takes 2 tablets     No current facility-administered medications for this visit.    OBJECTIVE: African-American man who appears younger than stated age  24:   12/15/20 1607   BP: (!) 160/82  Pulse: 65  Resp: 18  Temp: 98.4 F (36.9 C)  SpO2: 100%     Body mass index is 32.11 kg/m.   Wt Readings from Last 3 Encounters:  12/15/20 223 lb 12.8 oz (101.5 kg)  09/08/16 219 lb (99.3 kg)  09/06/16 219 lb 1.6 oz (99.4 kg)      ECOG FS:1 - Symptomatic but completely ambulatory  Ocular: Sclerae unicteric, pupils round and equal Ear-nose-throat: Wearing a mask Lymphatic: No cervical or supraclavicular adenopathy Lungs no rales or rhonchi Heart regular rate and rhythm Abd soft, nontender, positive bowel sounds MSK no focal spinal tenderness, no joint edema Neuro: non-focal, well-oriented, appropriate affect Breasts: The right breast is imaged below.  There is an easily palpable, movable subareolar mass which appears to be invading the nipple skin.  The left breast and both axillae are benign  Right breast 12/15/2020     LAB RESULTS:  CMP     Component Value Date/Time   NA 143 12/15/2020 1544   K 3.9 12/15/2020 1544   CL 109 12/15/2020 1544   CO2 24  12/15/2020 1544   GLUCOSE 92 12/15/2020 1544   BUN 17 12/15/2020 1544   CREATININE 1.45 (H) 12/15/2020 1544   CALCIUM 9.6 12/15/2020 1544   PROT 7.3 12/15/2020 1544   ALBUMIN 4.0 12/15/2020 1544   AST 15 12/15/2020 1544   ALT 14 12/15/2020 1544   ALKPHOS 71 12/15/2020 1544   BILITOT 0.8 12/15/2020 1544   GFRNONAA 48 (L) 12/15/2020 1544   GFRAA 53 (L) 09/09/2016 0513    No results found for: TOTALPROTELP, ALBUMINELP, A1GS, A2GS, BETS, BETA2SER, GAMS, MSPIKE, SPEI  Lab Results  Component Value Date   WBC 8.5 12/15/2020   NEUTROABS 5.1 12/15/2020   HGB 14.8 12/15/2020   HCT 41.4 12/15/2020   MCV 88.3 12/15/2020   PLT 252 12/15/2020    No results found for: LABCA2  No components found for: GXQJJH417  No results for input(s): INR in the last 168 hours.  No results found for: LABCA2  No results found for: EYC144  No results found for: YJE563  No results found for: JSH702  No  results found for: CA2729  No components found for: HGQUANT  No results found for: CEA1 / No results found for: CEA1   No results found for: AFPTUMOR  No results found for: CHROMOGRNA  No results found for: KPAFRELGTCHN, LAMBDASER, KAPLAMBRATIO (kappa/lambda light chains)  No results found for: HGBA, HGBA2QUANT, HGBFQUANT, HGBSQUAN (Hemoglobinopathy evaluation)   No results found for: LDH  No results found for: IRON, TIBC, IRONPCTSAT (Iron and TIBC)  No results found for: FERRITIN  Urinalysis No results found for: COLORURINE, APPEARANCEUR, LABSPEC, PHURINE, GLUCOSEU, HGBUR, BILIRUBINUR, KETONESUR, PROTEINUR, UROBILINOGEN, NITRITE, LEUKOCYTESUR   STUDIES: US BREAST LTD UNI RIGHT INC AXILLA  Result Date: 11/24/2020 CLINICAL DATA:  Patient presents with a palpable right breast mass directly behind his nipple, associated with nipple crusting and ulceration/induration. Patient has a family history of breast carcinoma in 3 maternal aunts and his mother. EXAM: DIGITAL DIAGNOSTIC BILATERAL MAMMOGRAM WITH TOMOSYNTHESIS AND CAD; ULTRASOUND RIGHT BREAST LIMITED TECHNIQUE: Bilateral digital diagnostic mammography and breast tomosynthesis was performed. The images were evaluated with computer-aided detection.; Targeted ultrasound examination of the right breast was performed COMPARISON:  None. ACR Breast Density Category b: There are scattered areas of fibroglandular density. FINDINGS: There is a partly obscured mass with associated coarse heterogeneous calcifications in the retroareolar right breast measuring approximately 2.4 cm in size. Mass is contiguous with the nipple that is also mildly retracted. No left breast masses. Both breasts show increased retroareolar density consistent with mild underlying gynecomastia. On physical exam, there is a firm mass fixed to the nipple in the retroareolar right breast. Nipple appears thickened and scaly. Targeted ultrasound is performed, showing a  hypoechoic irregular mass in the retroareolar right breast, which involves the nipple, and is associated with internal echogenic foci in reflecting calcifications. The mass measures 3.4 x 2.2 x 2.7 cm. Sonographic evaluation of the right axilla shows normal lymph nodes. There are no enlarged or abnormal lymph nodes. IMPRESSION: 1. 3.4 cm retroareolar right breast mass, contiguous with the skin of the nipple, highly suspicious for breast malignancy. No abnormal right axillary lymph nodes. 2. Benign left breast gynecomastia. No evidence of left breast malignancy. RECOMMENDATION: 1. Ultrasound-guided core needle biopsy of the retroareolar right breast mass. This has been scheduled for 11/28/2020 at 12:45 p.m. I have discussed the findings and recommendations with the patient. If applicable, a reminder letter will be sent to the patient regarding the next appointment. BI-RADS CATEGORY  5: Highly  suggestive of malignancy. Electronically Signed   By: Lajean Manes M.D.   On: 11/24/2020 14:33  MM DIAG BREAST TOMO BILATERAL  Result Date: 11/24/2020 CLINICAL DATA:  Patient presents with a palpable right breast mass directly behind his nipple, associated with nipple crusting and ulceration/induration. Patient has a family history of breast carcinoma in 3 maternal aunts and his mother. EXAM: DIGITAL DIAGNOSTIC BILATERAL MAMMOGRAM WITH TOMOSYNTHESIS AND CAD; ULTRASOUND RIGHT BREAST LIMITED TECHNIQUE: Bilateral digital diagnostic mammography and breast tomosynthesis was performed. The images were evaluated with computer-aided detection.; Targeted ultrasound examination of the right breast was performed COMPARISON:  None. ACR Breast Density Category b: There are scattered areas of fibroglandular density. FINDINGS: There is a partly obscured mass with associated coarse heterogeneous calcifications in the retroareolar right breast measuring approximately 2.4 cm in size. Mass is contiguous with the nipple that is also mildly  retracted. No left breast masses. Both breasts show increased retroareolar density consistent with mild underlying gynecomastia. On physical exam, there is a firm mass fixed to the nipple in the retroareolar right breast. Nipple appears thickened and scaly. Targeted ultrasound is performed, showing a hypoechoic irregular mass in the retroareolar right breast, which involves the nipple, and is associated with internal echogenic foci in reflecting calcifications. The mass measures 3.4 x 2.2 x 2.7 cm. Sonographic evaluation of the right axilla shows normal lymph nodes. There are no enlarged or abnormal lymph nodes. IMPRESSION: 1. 3.4 cm retroareolar right breast mass, contiguous with the skin of the nipple, highly suspicious for breast malignancy. No abnormal right axillary lymph nodes. 2. Benign left breast gynecomastia. No evidence of left breast malignancy. RECOMMENDATION: 1. Ultrasound-guided core needle biopsy of the retroareolar right breast mass. This has been scheduled for 11/28/2020 at 12:45 p.m. I have discussed the findings and recommendations with the patient. If applicable, a reminder letter will be sent to the patient regarding the next appointment. BI-RADS CATEGORY  5: Highly suggestive of malignancy. Electronically Signed   By: Lajean Manes M.D.   On: 11/24/2020 14:33  MM CLIP PLACEMENT RIGHT  Result Date: 11/28/2020 CLINICAL DATA:  Evaluate biopsy marker EXAM: 3D DIAGNOSTIC RIGHT MAMMOGRAM POST ULTRASOUND BIOPSY COMPARISON:  Previous exam(s). FINDINGS: 3D Mammographic images were obtained following ultrasound guided biopsy of a subareolar right breast mass. The biopsy marking clip is in expected position at the site of biopsy. IMPRESSION: Appropriate positioning of the ribbon shaped biopsy marking clip at the site of biopsy in the biopsied right breast mass. Final Assessment: Post Procedure Mammograms for Marker Placement Electronically Signed   By: Dorise Bullion III M.D   On: 11/28/2020  14:00  [place] RT BREAST BX W LOC DEV 1ST LESION IMG BX SPEC US GUIDE  Addendum Date: 12/02/2020   ADDENDUM REPORT: 12/01/2020 13:55 ADDENDUM: Pathology revealed GRADE I-II INVASIVE MAMMARY CARCINOMA of the Right breast, subareolar. This was found to be concordant by Dr. Dorise Bullion. Pathology results were discussed with the patient by telephone. The patient reported doing well after the biopsy with tenderness at the site. Post biopsy instructions and care were reviewed and questions were answered. The patient was encouraged to call [facility] for any additional concerns. My direct phone number was provided. Surgical consultation has been arranged with Dr. Nedra Hai at [facility] on December 05, 2020. Pathology results reported by Terie Purser, RN on 12/01/2020. Electronically Signed   By: Dorise Bullion III M.D   On: 12/01/2020 13:55   Result Date: 12/02/2020 CLINICAL  DATA:  Biopsy of a right breast mass EXAM: ULTRASOUND GUIDED RIGHT BREAST CORE NEEDLE BIOPSY COMPARISON:  Previous exam(s). PROCEDURE: I met with the patient and we discussed the procedure of ultrasound-guided biopsy, including benefits and alternatives. We discussed the high likelihood of a successful procedure. We discussed the risks of the procedure, including infection, bleeding, tissue injury, clip migration, and inadequate sampling. Informed written consent was given. The usual time-out protocol was performed immediately prior to the procedure. Lesion quadrant: Subareolar right breast Using sterile technique and 1% Lidocaine as local anesthetic, under direct ultrasound visualization, a 12 gauge spring-loaded device was used to perform biopsy of a subareolar right breast mass using a lateral approach. At the conclusion of the procedure a ribbon shaped tissue marker clip was deployed into the biopsy cavity. Follow up 2 view mammogram was performed and dictated separately. IMPRESSION: Ultrasound guided  biopsy of a subareolar right breast mass. No apparent complications. Electronically Signed: By: Dorise Bullion III M.D On: 11/28/2020 13:35    ELIGIBLE FOR AVAILABLE RESEARCH PROTOCOL: no  ASSESSMENT: 86 y.o. [place] man status post right central breast (retroareolar) biopsy 11/28/2020 for a clinical T2-T4, N0, stage IIA-IIIB invasive ductal carcinoma, estrogen receptor positive, progesterone receptor negative, HER2 amplified, with an MIB-1 of 15%  (1) genetics testing  (2) definitive surgery pending  (3) consider radiation depending on final pathology  (4) anti-HER2 treatment  (5) tamoxifen  PLAN: I met today with Jalan to review his new diagnosis. We first reviewed the fact that cancer is not one disease but more than 100 different diseases and that it is important to keep them separate-- otherwise when friends and relatives discuss their own cancer experiences with Maurico confusion can result. Similarly we explained that if breast cancer spreads to the bone or liver, the patient would not have bone cancer or liver cancer, but breast cancer in the bone and breast cancer in the liver: one cancer in three places-- not 3 different cancers which otherwise would have to be treated in 3 different ways.  We discussed the difference between local and systemic therapy. In terms of loco-regional treatment, lumpectomy plus radiation is equivalent to mastectomy as far as survival is concerned. For this reason, and because the cosmetic results are generally superior, we recommend breast conserving surgery.   We then discussed the rationale for systemic therapy. There is some risk that this cancer may have already spread to other parts of her body. Patients frequently ask at this point about bone scans, CAT scans and PET scans to find out if they have occult breast cancer somewhere else. The problem is that in early stage disease we are much more likely to find false positives then true cancers and  this would expose the patient to unnecessary procedures as well as unnecessary radiation.  Generally we do not do scans in stage II cases but do scans in stage III cases.  In Jacobo's situation what I have done is obtained a CA 27-29 today.  If it is elevated I will set him up for CT of the chest on bone scan but if it is in the normal range we will not do that.  Next we went over the options for systemic therapy which are anti-estrogens, anti-HER-2 immunotherapy, and chemotherapy. Khush meets criteria for all 3.  However I generally prefer to avoid chemotherapy in patients over 80 if at all possible.  My preference would be to treat him systemically with anti-HER2 treatment and antiestrogens  We are  setting him up for an echo and if this shows an adequate ejection fraction we will start Herceptin.  This can be given subcutaneously which would be the preferred way to do it in the setting and I have entered those orders.  If we can do that he will not need a port.  He understands that if the final pathology shows this to be a T4 tumor he will benefit from radiation and we discussed that today.  Finally he meets criteria for genetics testing.  If he does carry a dangerous mutation he would be at increased risk of developing another breast cancer in the contralateral breast.  At the same time, given his advanced age my recommendation in that case (as well as his preference) would be for intensified screening as opposed to bilateral mastectomies  The plan then is for left mastectomy with or without port placement, to be followed by trastuzumab for 1 year, which can be given concurrently with radiation if radiation is planned, and also with antiestrogens, namely tamoxifen, which can be started as soon as radiation is completed  Kemonte has a good understanding of the overall plan. he agrees with it. he will call with any problems that may develop before his next visit here.  Total encounter time 65  minutes.Sarajane Jews C. Meiah Zamudio, MD 12/15/2020 5:21 PM Medical Oncology and Hematology [facility] [place], [place] [address] Tel. [phone]    Fax. [phone]   This document serves as a record of services personally performed by Lurline Del, MD. It was created on his behalf by Wilburn Mylar, a trained medical scribe. The creation of this record is based on the scribe's personal observations and the provider's statements to them.   I, Lurline Del MD, have reviewed the above documentation for accuracy and completeness, and I agree with the above.    *Total Encounter Time as defined by the Centers for Medicare and Medicaid Services includes, in addition to the face-to-face time of a patient visit (documented in the note above) non-face-to-face time: obtaining and reviewing outside history, ordering and reviewing medications, tests or procedures, care coordination (communications with other health care professionals or caregivers) and documentation in the medical record.

## 2020-12-14 ENCOUNTER — Encounter: Payer: Self-pay | Admitting: Oncology

## 2020-12-15 ENCOUNTER — Other Ambulatory Visit: Payer: Self-pay

## 2020-12-15 ENCOUNTER — Inpatient Hospital Stay: Payer: Medicare Other | Attending: Oncology | Admitting: Oncology

## 2020-12-15 ENCOUNTER — Inpatient Hospital Stay: Payer: Medicare Other

## 2020-12-15 DIAGNOSIS — C50929 Malignant neoplasm of unspecified site of unspecified male breast: Secondary | ICD-10-CM

## 2020-12-15 DIAGNOSIS — K5900 Constipation, unspecified: Secondary | ICD-10-CM | POA: Insufficient documentation

## 2020-12-15 DIAGNOSIS — Z800 Family history of malignant neoplasm of digestive organs: Secondary | ICD-10-CM | POA: Diagnosis not present

## 2020-12-15 DIAGNOSIS — I10 Essential (primary) hypertension: Secondary | ICD-10-CM | POA: Insufficient documentation

## 2020-12-15 DIAGNOSIS — Z87891 Personal history of nicotine dependence: Secondary | ICD-10-CM | POA: Diagnosis not present

## 2020-12-15 DIAGNOSIS — C50121 Malignant neoplasm of central portion of right male breast: Secondary | ICD-10-CM | POA: Insufficient documentation

## 2020-12-15 DIAGNOSIS — M109 Gout, unspecified: Secondary | ICD-10-CM | POA: Diagnosis not present

## 2020-12-15 DIAGNOSIS — Z170 Estrogen receptor positive status [ER+]: Secondary | ICD-10-CM | POA: Diagnosis not present

## 2020-12-15 DIAGNOSIS — Z803 Family history of malignant neoplasm of breast: Secondary | ICD-10-CM | POA: Insufficient documentation

## 2020-12-15 LAB — CBC WITH DIFFERENTIAL (CANCER CENTER ONLY)
Abs Immature Granulocytes: 0.01 10*3/uL (ref 0.00–0.07)
Basophils Absolute: 0.1 10*3/uL (ref 0.0–0.1)
Basophils Relative: 1 %
Eosinophils Absolute: 0.2 10*3/uL (ref 0.0–0.5)
Eosinophils Relative: 3 %
HCT: 41.4 % (ref 39.0–52.0)
Hemoglobin: 14.8 g/dL (ref 13.0–17.0)
Immature Granulocytes: 0 %
Lymphocytes Relative: 29 %
Lymphs Abs: 2.4 10*3/uL (ref 0.7–4.0)
MCH: 31.6 pg (ref 26.0–34.0)
MCHC: 35.7 g/dL (ref 30.0–36.0)
MCV: 88.3 fL (ref 80.0–100.0)
Monocytes Absolute: 0.6 10*3/uL (ref 0.1–1.0)
Monocytes Relative: 7 %
Neutro Abs: 5.1 10*3/uL (ref 1.7–7.7)
Neutrophils Relative %: 60 %
Platelet Count: 252 10*3/uL (ref 150–400)
RBC: 4.69 MIL/uL (ref 4.22–5.81)
RDW: 12.9 % (ref 11.5–15.5)
WBC Count: 8.5 10*3/uL (ref 4.0–10.5)
nRBC: 0 % (ref 0.0–0.2)

## 2020-12-15 LAB — CMP (CANCER CENTER ONLY)
ALT: 14 U/L (ref 0–44)
AST: 15 U/L (ref 15–41)
Albumin: 4 g/dL (ref 3.5–5.0)
Alkaline Phosphatase: 71 U/L (ref 38–126)
Anion gap: 10 (ref 5–15)
BUN: 17 mg/dL (ref 8–23)
CO2: 24 mmol/L (ref 22–32)
Calcium: 9.6 mg/dL (ref 8.9–10.3)
Chloride: 109 mmol/L (ref 98–111)
Creatinine: 1.45 mg/dL — ABNORMAL HIGH (ref 0.61–1.24)
GFR, Estimated: 48 mL/min — ABNORMAL LOW (ref >60)
Glucose, Bld: 92 mg/dL (ref 70–99)
Potassium: 3.9 mmol/L (ref 3.5–5.1)
Sodium: 143 mmol/L (ref 135–145)
Total Bilirubin: 0.8 mg/dL (ref 0.3–1.2)
Total Protein: 7.3 g/dL (ref 6.5–8.1)

## 2020-12-15 NOTE — Progress Notes (Signed)
START OFF PATHWAY REGIMEN - Breast   [id number]:Trastuzumab (Maintenance - w/Loading Dose) x 11 Cycles:   A cycle is every 21 days:     Trastuzumab-xxxx      Trastuzumab-xxxx   **Always confirm dose/schedule in your pharmacy ordering system**  Patient Characteristics: Postoperative without Neoadjuvant Therapy (Pathologic Staging), Invasive Disease, Adjuvant Therapy, HER2 Positive, ER Positive, Node Negative, pT3 or pT4, pN0 Therapeutic Status: Postoperative without Neoadjuvant Therapy (Pathologic Staging) AJCC Grade: G2 AJCC N Category: pN0 AJCC M Category: cM0 ER Status: Positive (+) AJCC 8 Stage Grouping: IIIB HER2 Status: Positive (+) Oncotype Dx Recurrence Score: Not Appropriate AJCC T Category: pT4 PR Status: Negative (-) Adjuvant Therapy Status: No Adjuvant Therapy Received Yet or Changing Initial Adjuvant Regimen due to Tolerance Intent of Therapy: Curative Intent, Discussed with Patient

## 2020-12-16 ENCOUNTER — Encounter: Payer: Self-pay | Admitting: *Deleted

## 2020-12-16 ENCOUNTER — Telehealth: Payer: Self-pay | Admitting: Oncology

## 2020-12-16 ENCOUNTER — Telehealth: Payer: Self-pay | Admitting: *Deleted

## 2020-12-16 LAB — CANCER ANTIGEN 27.29: CA 27.29: 26.2 U/mL (ref 0.0–38.6)

## 2020-12-16 NOTE — Telephone Encounter (Signed)
Spoke to pt, provided navigation resources and contact information.  Discussed and confirmed echo and chemo class on 8/12 at 11am. Denies questions or concerns regarding dx or treatment care plan. Encourage pt to call with needs. Received verbal understanding.

## 2020-12-16 NOTE — Telephone Encounter (Signed)
Scheduled appointment per 08/01 los. Patient is aware. 

## 2020-12-19 ENCOUNTER — Encounter (HOSPITAL_BASED_OUTPATIENT_CLINIC_OR_DEPARTMENT_OTHER): Payer: Self-pay | Admitting: Surgery

## 2020-12-19 ENCOUNTER — Other Ambulatory Visit: Payer: Self-pay

## 2020-12-21 ENCOUNTER — Other Ambulatory Visit: Payer: Self-pay | Admitting: Surgery

## 2020-12-21 DIAGNOSIS — Z853 Personal history of malignant neoplasm of breast: Secondary | ICD-10-CM

## 2020-12-22 ENCOUNTER — Encounter (HOSPITAL_BASED_OUTPATIENT_CLINIC_OR_DEPARTMENT_OTHER)
Admission: RE | Admit: 2020-12-22 | Discharge: 2020-12-22 | Disposition: A | Discharge status: Home / Self Care | Payer: Medicare Other | Source: Ambulatory Visit | Attending: Surgery | Admitting: Surgery

## 2020-12-22 DIAGNOSIS — Z01818 Encounter for other preprocedural examination: Secondary | ICD-10-CM | POA: Insufficient documentation

## 2020-12-22 LAB — BASIC METABOLIC PANEL
Anion gap: 10 (ref 5–15)
BUN: 15 mg/dL (ref 8–23)
CO2: 24 mmol/L (ref 22–32)
Calcium: 9.5 mg/dL (ref 8.9–10.3)
Chloride: 107 mmol/L (ref 98–111)
Creatinine, Ser: 1.41 mg/dL — ABNORMAL HIGH (ref 0.61–1.24)
GFR, Estimated: 49 mL/min — ABNORMAL LOW (ref >60)
Glucose, Bld: 93 mg/dL (ref 70–99)
Potassium: 4.4 mmol/L (ref 3.5–5.1)
Sodium: 141 mmol/L (ref 135–145)

## 2020-12-22 MED ORDER — ENSURE PRE-SURGERY PO LIQD: 296.0000 mL | Freq: Once | ORAL | Status: DC

## 2020-12-22 NOTE — Progress Notes (Signed)

## 2020-12-25 ENCOUNTER — Other Ambulatory Visit: Payer: Medicare Other

## 2020-12-26 ENCOUNTER — Inpatient Hospital Stay: Payer: Medicare Other

## 2020-12-26 ENCOUNTER — Other Ambulatory Visit: Payer: Self-pay

## 2020-12-26 ENCOUNTER — Ambulatory Visit (HOSPITAL_COMMUNITY)
Admission: RE | Admit: 2020-12-26 | Discharge: 2020-12-26 | Disposition: A | Discharge status: Home / Self Care | Payer: Medicare Other | Source: Ambulatory Visit | Attending: Oncology | Admitting: Oncology

## 2020-12-26 DIAGNOSIS — C50121 Malignant neoplasm of central portion of right male breast: Secondary | ICD-10-CM

## 2020-12-26 DIAGNOSIS — Z170 Estrogen receptor positive status [ER+]: Secondary | ICD-10-CM | POA: Diagnosis not present

## 2020-12-26 DIAGNOSIS — Z01818 Encounter for other preprocedural examination: Secondary | ICD-10-CM | POA: Diagnosis present

## 2020-12-26 DIAGNOSIS — Z0189 Encounter for other specified special examinations: Secondary | ICD-10-CM | POA: Diagnosis not present

## 2020-12-26 DIAGNOSIS — I10 Essential (primary) hypertension: Secondary | ICD-10-CM | POA: Diagnosis not present

## 2020-12-26 LAB — ECHOCARDIOGRAM COMPLETE
Area-P 1/2: 3.53 cm2
Calc EF: 55.4 %
S' Lateral: 3.1 cm
Single Plane A2C EF: 49.6 %
Single Plane A4C EF: 64.6 %

## 2020-12-26 NOTE — Progress Notes (Signed)
  Echocardiogram 2D Echocardiogram has been performed.  Terrance Powell 12/26/2020, 11:58 AM

## 2020-12-28 NOTE — H&P (Signed)
  PROVIDER: Beverlee Nims, MD  MRN: [id number] DOB: May 05, 1935 DATE OF ENCOUNTER: 12/05/2020 Subjective   Chief Complaint: No chief complaint on file.   History of Present Illness: Terrance Powell is a 86 y.o. male who is seen today for a follow-up regarding his right breast cancer.  He has been contacted by the cancer center and his appointment to see them is on August 1.  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 It was 95% ER positive, 0% PR positive, HER2 positive, with a Ki-67 of 15%..  Review of Systems: A complete review of systems was obtained from the patient. I have reviewed this information and discussed as appropriate with the patient. See HPI as well for other ROS.  ROS   Medical History: No past medical history on file.  There is no problem list on file for this patient.  Past Surgical History:  Procedure Laterality Date   COLON SURGERY    Allergies  Allergen Reactions   Penicillins Unknown   No current outpatient medications on file prior to visit.   No current facility-administered medications on file prior to visit.   Family History  Problem Relation Age of Onset   Breast cancer Mother   Colon cancer Father    Social History   Tobacco Use  Smoking Status Never Smoker  Smokeless Tobacco Never Used    Social History   Socioeconomic History   Marital status: Married  Tobacco Use   Smoking status: Never Smoker   Smokeless tobacco: Never Used  Substance and Sexual Activity   Alcohol use: Yes   Drug use: Never   Objective:   There were no vitals filed for this visit.  There is no height or weight on file to calculate BMI.  Physical Exam   Breast: There is a firm mass involving the nipple areolar complex with crusting of the skin. This is on the right side. The left side appears normal. There is no axillary adenopathy.  Generally well in appearance Lungs clear CV RRR Abdomen soft, NT Neuro no gross  deficits  Labs, Imaging and Diagnostic Testing:  Assessment and Plan:  Diagnoses and all orders for this visit:  Malignant neoplasm of central portion of right male breast, unspecified estrogen receptor status (CMS-HCC)    I gave him a copy of the pathology results and we discussed the findings. He will now see medical oncology. A right mastectomy is recommended. The decision will be whether or not he needs a Port-A-Cath after discussion with the oncologist. Once he is seen oncology, we will then schedule surgery. He will need a right mastectomy and sentinel node biopay.  He understands and agrees with the plan.    We discussed the risks of surgery which include but are not limited to bleeding, infection, injury to surrounding structures, the need for drains, the need for other procedures, arm edema, cardiopulmonary issues, etc.

## 2020-12-29 ENCOUNTER — Ambulatory Visit (HOSPITAL_BASED_OUTPATIENT_CLINIC_OR_DEPARTMENT_OTHER): Payer: Medicare Other | Admitting: Anesthesiology

## 2020-12-29 ENCOUNTER — Encounter (HOSPITAL_BASED_OUTPATIENT_CLINIC_OR_DEPARTMENT_OTHER)
Admission: RE | Disposition: A | Discharge status: Home / Self Care | Payer: Self-pay | Source: Home / Self Care | Attending: Surgery

## 2020-12-29 ENCOUNTER — Encounter (HOSPITAL_COMMUNITY)
Admission: RE | Admit: 2020-12-29 | Discharge: 2020-12-29 | Disposition: A | Discharge status: Home / Self Care | Payer: Medicare Other | Source: Ambulatory Visit | Attending: Surgery | Admitting: Surgery

## 2020-12-29 ENCOUNTER — Encounter (HOSPITAL_BASED_OUTPATIENT_CLINIC_OR_DEPARTMENT_OTHER): Payer: Self-pay | Admitting: Surgery

## 2020-12-29 ENCOUNTER — Other Ambulatory Visit: Payer: Self-pay

## 2020-12-29 ENCOUNTER — Observation Stay (HOSPITAL_BASED_OUTPATIENT_CLINIC_OR_DEPARTMENT_OTHER)
Admission: RE | Admit: 2020-12-29 | Discharge: 2020-12-30 | Disposition: A | Discharge status: Home / Self Care | Payer: Medicare Other | Attending: Surgery | Admitting: Surgery

## 2020-12-29 DIAGNOSIS — Z170 Estrogen receptor positive status [ER+]: Secondary | ICD-10-CM | POA: Diagnosis not present

## 2020-12-29 DIAGNOSIS — C50921 Malignant neoplasm of unspecified site of right male breast: Principal | ICD-10-CM | POA: Insufficient documentation

## 2020-12-29 DIAGNOSIS — Z803 Family history of malignant neoplasm of breast: Secondary | ICD-10-CM | POA: Insufficient documentation

## 2020-12-29 DIAGNOSIS — Z9011 Acquired absence of right breast and nipple: Secondary | ICD-10-CM

## 2020-12-29 DIAGNOSIS — Z853 Personal history of malignant neoplasm of breast: Secondary | ICD-10-CM

## 2020-12-29 HISTORY — PX: MASTECTOMY W/ SENTINEL NODE BIOPSY: SHX2001

## 2020-12-29 SURGERY — MASTECTOMY WITH SENTINEL LYMPH NODE BIOPSY
Anesthesia: Regional | Site: Breast | Laterality: Right

## 2020-12-29 MED ORDER — ROCURONIUM BROMIDE 100 MG/10ML IV SOLN
INTRAVENOUS | Status: DC | PRN
  Administered 2020-12-29: 80 mg via INTRAVENOUS

## 2020-12-29 MED ORDER — DIPHENHYDRAMINE HCL 50 MG/ML IJ SOLN: 12.5000 mg | Freq: Four times a day (QID) | INTRAMUSCULAR | Status: DC | PRN

## 2020-12-29 MED ORDER — ACETAMINOPHEN 500 MG PO TABS
1000.0000 mg | ORAL_TABLET | Freq: Once | ORAL | Status: AC
  Administered 2020-12-29: 1000 mg via ORAL

## 2020-12-29 MED ORDER — PROPOFOL 10 MG/ML IV BOLUS
INTRAVENOUS | Status: DC | PRN
  Administered 2020-12-29: 100 mg via INTRAVENOUS
  Administered 2020-12-29: 30 mg via INTRAVENOUS

## 2020-12-29 MED ORDER — CHLORHEXIDINE GLUCONATE CLOTH 2 % EX PADS: 6.0000 | MEDICATED_PAD | Freq: Once | CUTANEOUS | Status: DC

## 2020-12-29 MED ORDER — SODIUM CHLORIDE 0.9 % IV SOLN: INTRAVENOUS | Status: DC

## 2020-12-29 MED ORDER — LIDOCAINE HCL (PF) 2 % IJ SOLN
INTRAMUSCULAR | Status: AC
  Filled 2020-12-29: qty 5

## 2020-12-29 MED ORDER — ONDANSETRON HCL 4 MG/2ML IJ SOLN
INTRAMUSCULAR | Status: AC
  Filled 2020-12-29: qty 2

## 2020-12-29 MED ORDER — LIDOCAINE HCL (CARDIAC) PF 100 MG/5ML IV SOSY
PREFILLED_SYRINGE | INTRAVENOUS | Status: DC | PRN
  Administered 2020-12-29: 20 mg via INTRATRACHEAL

## 2020-12-29 MED ORDER — ENOXAPARIN SODIUM 40 MG/0.4ML IJ SOSY: 40.0000 mg | PREFILLED_SYRINGE | INTRAMUSCULAR | Status: DC

## 2020-12-29 MED ORDER — ROCURONIUM BROMIDE 10 MG/ML (PF) SYRINGE
PREFILLED_SYRINGE | INTRAVENOUS | Status: AC
  Filled 2020-12-29: qty 10

## 2020-12-29 MED ORDER — ACETAMINOPHEN 500 MG PO TABS: 1000.0000 mg | ORAL_TABLET | ORAL | Status: DC

## 2020-12-29 MED ORDER — CIPROFLOXACIN IN D5W 400 MG/200ML IV SOLN
400.0000 mg | INTRAVENOUS | Status: AC
  Administered 2020-12-29: 400 mg via INTRAVENOUS

## 2020-12-29 MED ORDER — SUGAMMADEX SODIUM 200 MG/2ML IV SOLN
INTRAVENOUS | Status: DC | PRN
  Administered 2020-12-29: 200 mg via INTRAVENOUS

## 2020-12-29 MED ORDER — DIPHENHYDRAMINE HCL 12.5 MG/5ML PO ELIX: 12.5000 mg | ORAL_SOLUTION | Freq: Four times a day (QID) | ORAL | Status: DC | PRN

## 2020-12-29 MED ORDER — FENTANYL CITRATE (PF) 100 MCG/2ML IJ SOLN
50.0000 ug | Freq: Once | INTRAMUSCULAR | Status: AC
Start: 2020-12-29 — End: 2020-12-29
  Administered 2020-12-29: 50 ug via INTRAVENOUS

## 2020-12-29 MED ORDER — TECHNETIUM TC 99M TILMANOCEPT KIT
1.0000 | PACK | Freq: Once | INTRAVENOUS | Status: AC | PRN
  Administered 2020-12-29: 1 via INTRADERMAL

## 2020-12-29 MED ORDER — ONDANSETRON HCL 4 MG/2ML IJ SOLN: 4.0000 mg | Freq: Once | INTRAMUSCULAR | Status: DC | PRN

## 2020-12-29 MED ORDER — LISINOPRIL 20 MG PO TABS
20.0000 mg | ORAL_TABLET | Freq: Every day | ORAL | Status: DC
  Administered 2020-12-29: 20 mg via ORAL
  Filled 2020-12-29: qty 1

## 2020-12-29 MED ORDER — TRAMADOL HCL 50 MG PO TABS
50.0000 mg | ORAL_TABLET | Freq: Four times a day (QID) | ORAL | Status: DC | PRN
Start: 2020-12-29 — End: 2020-12-30

## 2020-12-29 MED ORDER — OXYCODONE HCL 5 MG/5ML PO SOLN: 5.0000 mg | Freq: Once | ORAL | Status: DC | PRN

## 2020-12-29 MED ORDER — MIDAZOLAM HCL 2 MG/2ML IJ SOLN
INTRAMUSCULAR | Status: AC
  Filled 2020-12-29: qty 2

## 2020-12-29 MED ORDER — NON FORMULARY
Status: DC | PRN
  Administered 2020-12-29: 2 mL via INTRAMUSCULAR

## 2020-12-29 MED ORDER — AMISULPRIDE (ANTIEMETIC) 5 MG/2ML IV SOLN: 10.0000 mg | Freq: Once | INTRAVENOUS | Status: DC | PRN

## 2020-12-29 MED ORDER — SODIUM CHLORIDE 0.9 % IV SOLN
INTRAVENOUS | Status: DC | PRN
  Administered 2020-12-29: 40 ug via INTRAVENOUS
  Administered 2020-12-29: 120 ug via INTRAVENOUS
  Administered 2020-12-29: 40 ug via INTRAVENOUS
  Administered 2020-12-29: 120 ug via INTRAVENOUS
  Administered 2020-12-29: 40 ug via INTRAVENOUS
  Administered 2020-12-29: 80 ug via INTRAVENOUS

## 2020-12-29 MED ORDER — LACTATED RINGERS IV SOLN: INTRAVENOUS | Status: DC

## 2020-12-29 MED ORDER — OXYCODONE HCL 5 MG PO TABS: 5.0000 mg | ORAL_TABLET | Freq: Four times a day (QID) | ORAL | Status: DC | PRN

## 2020-12-29 MED ORDER — FENTANYL CITRATE (PF) 100 MCG/2ML IJ SOLN
INTRAMUSCULAR | Status: DC | PRN
  Administered 2020-12-29: 50 ug via INTRAVENOUS

## 2020-12-29 MED ORDER — ONDANSETRON HCL 4 MG/2ML IJ SOLN
INTRAMUSCULAR | Status: DC | PRN
  Administered 2020-12-29: 4 mg via INTRAVENOUS

## 2020-12-29 MED ORDER — HYDROMORPHONE HCL 1 MG/ML IJ SOLN
0.5000 mg | INTRAMUSCULAR | Status: DC | PRN
Start: 2020-12-29 — End: 2020-12-30

## 2020-12-29 MED ORDER — CIPROFLOXACIN IN D5W 400 MG/200ML IV SOLN: 400.0000 mg | INTRAVENOUS | Status: DC

## 2020-12-29 MED ORDER — ACETAMINOPHEN 500 MG PO TABS
1000.0000 mg | ORAL_TABLET | Freq: Four times a day (QID) | ORAL | Status: DC
  Administered 2020-12-29 – 2020-12-30 (×3): 1000 mg via ORAL
  Filled 2020-12-29 (×3): qty 2

## 2020-12-29 MED ORDER — FENTANYL CITRATE (PF) 100 MCG/2ML IJ SOLN
INTRAMUSCULAR | Status: AC
  Filled 2020-12-29: qty 2

## 2020-12-29 MED ORDER — OXYCODONE HCL 5 MG PO TABS: 5.0000 mg | ORAL_TABLET | Freq: Once | ORAL | Status: DC | PRN

## 2020-12-29 MED ORDER — BUPIVACAINE HCL (PF) 0.5 % IJ SOLN
INTRAMUSCULAR | Status: DC | PRN
  Administered 2020-12-29: 20 mL via PERINEURAL

## 2020-12-29 MED ORDER — BUPIVACAINE LIPOSOME 1.3 % IJ SUSP
INTRAMUSCULAR | Status: DC | PRN
  Administered 2020-12-29: 10 mL via PERINEURAL

## 2020-12-29 MED ORDER — CIPROFLOXACIN IN D5W 400 MG/200ML IV SOLN
INTRAVENOUS | Status: AC
  Filled 2020-12-29: qty 200

## 2020-12-29 MED ORDER — FENTANYL CITRATE (PF) 100 MCG/2ML IJ SOLN: 25.0000 ug | INTRAMUSCULAR | Status: DC | PRN

## 2020-12-29 MED ORDER — BUMETANIDE 0.5 MG PO TABS
0.5000 mg | ORAL_TABLET | Freq: Every day | ORAL | Status: DC
  Administered 2020-12-29: 0.5 mg via ORAL
  Filled 2020-12-29: qty 1

## 2020-12-29 MED ORDER — DEXAMETHASONE SODIUM PHOSPHATE 10 MG/ML IJ SOLN
INTRAMUSCULAR | Status: AC
  Filled 2020-12-29: qty 1

## 2020-12-29 MED ORDER — EPHEDRINE SULFATE 50 MG/ML IJ SOLN
INTRAMUSCULAR | Status: DC | PRN
  Administered 2020-12-29 (×3): 5 mg via INTRAVENOUS

## 2020-12-29 MED ORDER — ACETAMINOPHEN 500 MG PO TABS
ORAL_TABLET | ORAL | Status: AC
  Filled 2020-12-29: qty 2

## 2020-12-29 SURGICAL SUPPLY — 51 items
APPLIER CLIP 9.375 MED OPEN (MISCELLANEOUS) ×2
BINDER BREAST MEDIUM (GAUZE/BANDAGES/DRESSINGS) ×1 IMPLANT
BLADE SURG 15 STRL LF DISP TIS (BLADE) ×1 IMPLANT
BLADE SURG 15 STRL SS (BLADE) ×1
CANISTER SUCT 1200ML W/VALVE (MISCELLANEOUS) ×2 IMPLANT
CHLORAPREP W/TINT 26 (MISCELLANEOUS) ×2 IMPLANT
CLIP APPLIE 9.375 MED OPEN (MISCELLANEOUS) IMPLANT
COVER BACK TABLE 60X90IN (DRAPES) ×2 IMPLANT
COVER MAYO STAND STRL (DRAPES) ×2 IMPLANT
COVER PROBE W GEL 5X96 (DRAPES) ×2 IMPLANT
DERMABOND ADVANCED (GAUZE/BANDAGES/DRESSINGS) ×1
DERMABOND ADVANCED .7 DNX12 (GAUZE/BANDAGES/DRESSINGS) ×1 IMPLANT
DRAIN CHANNEL 19F RND (DRAIN) ×2 IMPLANT
DRAPE LAPAROSCOPIC ABDOMINAL (DRAPES) ×2 IMPLANT
DRAPE UTILITY XL STRL (DRAPES) ×2 IMPLANT
DRSG PAD ABDOMINAL 8X10 ST (GAUZE/BANDAGES/DRESSINGS) ×1 IMPLANT
ELECT REM PT RETURN 9FT ADLT (ELECTROSURGICAL) ×2
ELECTRODE REM PT RTRN 9FT ADLT (ELECTROSURGICAL) ×1 IMPLANT
EVACUATOR SILICONE 100CC (DRAIN) ×2 IMPLANT
GAUZE SPONGE 4X4 12PLY STRL (GAUZE/BANDAGES/DRESSINGS) ×1 IMPLANT
GAUZE SPONGE 4X4 12PLY STRL LF (GAUZE/BANDAGES/DRESSINGS) IMPLANT
GLOVE SURG ENC MOIS LTX SZ6.5 (GLOVE) ×1 IMPLANT
GLOVE SURG SIGNA 7.5 PF LTX (GLOVE) ×2 IMPLANT
GLOVE SURG UNDER POLY LF SZ6.5 (GLOVE) ×3 IMPLANT
GOWN STRL REUS W/ TWL LRG LVL3 (GOWN DISPOSABLE) ×1 IMPLANT
GOWN STRL REUS W/ TWL XL LVL3 (GOWN DISPOSABLE) ×1 IMPLANT
GOWN STRL REUS W/TWL LRG LVL3 (GOWN DISPOSABLE) ×2
GOWN STRL REUS W/TWL XL LVL3 (GOWN DISPOSABLE) ×1
HEMOSTAT SURGICEL 2X14 (HEMOSTASIS) IMPLANT
LIGHT WAVEGUIDE WIDE FLAT (MISCELLANEOUS) ×2 IMPLANT
NDL HYPO 25X1 1.5 SAFETY (NEEDLE) ×2 IMPLANT
NDL SAFETY ECLIPSE 18X1.5 (NEEDLE) ×1 IMPLANT
NEEDLE HYPO 18GX1.5 SHARP (NEEDLE) ×1
NEEDLE HYPO 25X1 1.5 SAFETY (NEEDLE) ×4 IMPLANT
NS IRRIG 1000ML POUR BTL (IV SOLUTION) ×2 IMPLANT
PACK BASIN DAY SURGERY FS (CUSTOM PROCEDURE TRAY) ×2 IMPLANT
PENCIL SMOKE EVACUATOR (MISCELLANEOUS) ×2 IMPLANT
PIN SAFETY STERILE (MISCELLANEOUS) ×2 IMPLANT
SLEEVE SCD COMPRESS KNEE MED (STOCKING) ×2 IMPLANT
SPONGE T-LAP 18X18 ~~LOC~~+RFID (SPONGE) ×2 IMPLANT
SUT ETHILON 3 0 PS 1 (SUTURE) ×2 IMPLANT
SUT MNCRL AB 4-0 PS2 18 (SUTURE) ×2 IMPLANT
SUT VIC AB 3-0 SH 27 (SUTURE)
SUT VIC AB 3-0 SH 27X BRD (SUTURE) ×1 IMPLANT
SUT VICRYL 3-0 CR8 SH (SUTURE) ×2 IMPLANT
SYR BULB EAR ULCER 3OZ GRN STR (SYRINGE) ×2 IMPLANT
SYR CONTROL 10ML LL (SYRINGE) ×4 IMPLANT
TOWEL GREEN STERILE FF (TOWEL DISPOSABLE) ×2 IMPLANT
TRACER MAGTRACE VIAL (MISCELLANEOUS) ×1 IMPLANT
TUBE CONNECTING 20X1/4 (TUBING) ×2 IMPLANT
YANKAUER SUCT BULB TIP NO VENT (SUCTIONS) ×2 IMPLANT

## 2020-12-29 NOTE — Anesthesia Procedure Notes (Signed)
Procedure Name: Intubation Date/Time: 12/29/2020 10:23 AM Performed by: Verita Lamb, CRNA Pre-anesthesia Checklist: Patient identified, Emergency Drugs available, Suction available and Patient being monitored Patient Re-evaluated:Patient Re-evaluated prior to induction Oxygen Delivery Method: Circle system utilized Preoxygenation: Pre-oxygenation with 100% oxygen Induction Type: IV induction Ventilation: Mask ventilation without difficulty Laryngoscope Size: Mac and 4 Grade View: Grade III Tube type: Oral Tube size: 7.0 mm Number of attempts: 1 Airway Equipment and Method: Stylet and Oral airway Placement Confirmation: ETT inserted through vocal cords under direct vision, positive ETCO2, breath sounds checked- equal and bilateral and CO2 detector Secured at: 23 cm Tube secured with: Tape Dental Injury: Teeth and Oropharynx as per pre-operative assessment

## 2020-12-29 NOTE — Transfer of Care (Signed)
Immediate Anesthesia Transfer of Care Note  Patient: [name]  Procedure(s) Performed: RIGHT MASTECTOMY WITH SENTINEL LYMPH NODE BIOPSY (Right: Breast)  Patient Location: PACU  Anesthesia Type:General and Regional  Level of Consciousness: awake, alert  and oriented  Airway & Oxygen Therapy: Patient Spontanous Breathing and Patient connected to face mask oxygen  Post-op Assessment: Report given to RN and Post -op Vital signs reviewed and stable  Post vital signs: Reviewed and stable  Last Vitals:  Vitals Value Taken Time  BP    Temp    Pulse    Resp 13 12/29/20 1159  SpO2    Vitals shown include unvalidated device data.  Last Pain:  Vitals:   12/29/20 0826  TempSrc: Oral  PainSc: 0-No pain         Complications: No notable events documented.

## 2020-12-29 NOTE — Interval H&P Note (Signed)
History and Physical Interval Note: no change in H and P  12/29/2020 8:31 AM  Terrance Powell  has presented today for surgery, with the diagnosis of RIGHT BREAST CANCER.  The various methods of treatment have been discussed with the patient and family. After consideration of risks, benefits and other options for treatment, the patient has consented to  Procedure(s): RIGHT MASTECTOMY WITH SENTINEL LYMPH NODE BIOPSY (Right) as a surgical intervention.  The patient's history has been reviewed, patient examined, no change in status, stable for surgery.  I have reviewed the patient's chart and labs.  Questions were answered to the patient's satisfaction.     [name]

## 2020-12-29 NOTE — Op Note (Signed)
RIGHT MASTECTOMY WITH SENTINEL LYMPH NODE BIOPSY  Procedure Note  GARELD OBRECHT 12/29/2020   Pre-op Diagnosis: RIGHT BREAST CANCER     Post-op Diagnosis: same  Procedure(s): RIGHT MASTECTOMY WITH DEEP AXILLARY SENTINEL LYMPH NODE BIOPSY  Surgeon(s): [name], MD  Assist: Zacarias Pontes, MD  Anesthesia: General  Staff:  Circulator: Izora Ribas, RN Scrub Person: Lorenza Burton, CST  Estimated Blood Loss: Minimal               Specimens: sent to path  Indications: This is an 86 year old gentleman who presented with a right breast mass eroding into the nipple.  It measured approximate 3.4 cm on mammography.  Biopsy showed invasive ductal carcinoma which was ER positive, PR positive, and HER2 positive.  The decision was made to proceed to the operating room for a right mastectomy and sentinel lymph node biopsy.  Findings: 2-3 right axillary sentinel lymph nodes were removed with the aid of mag seed injection and Lymphoseek  Procedure: The patient is brought to the operating identifies correct patient.  He is placed upon the operating table and general anesthesia was induced.  He had already been injected around the right areola with Lymphoseek by the radiation technologist.  He underwent a right pectoralis block by anesthesiology.  I then injected mag seed underneath the right breast areola just lateral to the tumor with a 25-gauge needle.  2 cc was used.  The breast was then vigorously massaged for up to 5 minutes.  The patient's right breast and axilla were then prepped and draped in the usual sterile fashion.  Using a scalpel, an elliptical incision was made transversely across the right chest incorporating the nipple areolar complex.  The dissection was then carried down into the breast tissue circumferentially with electrocautery.  The superior skin flap was then dissected free first going superiorly toward the clavicle and then down to the chest wall.  The inferior skin  flap was then dissected going down to the inframammary ridge.  The breast tissue was then slowly dissected off the pectoralis muscle with electrocautery dissecting medial to lateral.  We then completed the mastectomy removing the breast tissue.  The specimen was marked at the lateral margin with a suture.  It was then sent to pathology for evaluation.  We then had to dissect superiorly toward the axilla.  We identified 2 areas of increased uptake of the mag seed with the detector.  All lymph nodes were very small so I incorporated the surrounding fat in this dissection of each of these areas.  One of the samples of tissue was confirmed also be positive with the neoprobe detecting increased uptake as well.  Both of these tissue samples containing the nodes were sent to pathology for evaluation.  No other increased uptake was identified in the axilla.  There were no large palpable nodes in the axilla.  I then irrigated the chest and axilla with saline.  Hemostasis appeared to be achieved.  A separate skin incision was made and a 47 French drain was placed into the incision and then sutured in place with an 2-0 nylon suture.  The subcutaneous tissue was then closed of the mastectomy site with interrupted 3-0 Vicryl sutures and the skin was closed with running 4-0 Monocryl.  Dermabond was then applied.  The patient was placed in a breast binder.  The drain was placed to bulb suction.  The patient tolerated the procedure well.  All the counts were correct at the end  of the procedure.  The patient was then extubated in the operating room and taken in stable condition to the recovery room.          [name]   Date: 12/29/2020  Time: 12:44 PM

## 2020-12-29 NOTE — Anesthesia Postprocedure Evaluation (Signed)
Anesthesia Post Note  Patient: Terrance Powell  Procedure(s) Performed: RIGHT MASTECTOMY WITH SENTINEL LYMPH NODE BIOPSY (Right: Breast)     Patient location during evaluation: PACU Anesthesia Type: General and Regional Level of consciousness: awake and alert, oriented and patient cooperative Pain management: pain level controlled Vital Signs Assessment: post-procedure vital signs reviewed and stable Respiratory status: spontaneous breathing, nonlabored ventilation and respiratory function stable Cardiovascular status: blood pressure returned to baseline and stable Postop Assessment: no apparent nausea or vomiting Anesthetic complications: no   No notable events documented.  Last Vitals:  Vitals:   12/29/20 1200 12/29/20 1215  BP: (!) 152/76 140/85  Pulse:  70  Resp: 11 15  Temp:    SpO2: 96% 99%    Last Pain:  Vitals:   12/29/20 1159  TempSrc:   PainSc: Daphne

## 2020-12-29 NOTE — Anesthesia Procedure Notes (Signed)
Anesthesia Regional Block: Pectoralis block   Pre-Anesthetic Checklist: , timeout performed,  Correct Patient, Correct Site, Correct Laterality,  Correct Procedure, Correct Position, site marked,  Risks and benefits discussed,  Surgical consent,  Pre-op evaluation,  At surgeon's request and post-op pain management  Laterality: Right  Prep: Maximum Sterile Barrier Precautions used, chloraprep       Needles:  Injection technique: Single-shot  Needle Type: Echogenic Stimulator Needle     Needle Length: 9cm  Needle Gauge: 22     Additional Needles:   Procedures:,,,, ultrasound used (permanent image in chart),,    Narrative:  Start time: 12/29/2020 9:47 AM End time: 12/29/2020 9:50 AM Injection made incrementally with aspirations every 5 mL.  Performed by: Personally  Anesthesiologist: Pervis Hocking, DO  Additional Notes: Monitors applied. No increased pain on injection. No increased resistance to injection. Injection made in 5cc increments. Good needle visualization. Patient tolerated procedure well.

## 2020-12-29 NOTE — Progress Notes (Signed)
Emotional support provided through nuclear medicine breast injections. Vital signs stable. Patient tolerated well.  

## 2020-12-29 NOTE — Anesthesia Preprocedure Evaluation (Addendum)
Anesthesia Evaluation  Patient identified by MRN, date of birth, ID band Patient awake    Reviewed: Allergy & Precautions, NPO status , Patient's Chart, lab work & pertinent test results  Airway Mallampati: II  TM Distance: >3 FB Neck ROM: Full    Dental no notable dental hx.    Pulmonary former smoker,    Pulmonary exam normal breath sounds clear to auscultation       Cardiovascular hypertension (156/77 in preop, normally lower per pt), Pt. on medications Normal cardiovascular exam Rhythm:Regular Rate:Normal  Echo 12/2020: 1. Left ventricular ejection fraction, by estimation, is 55 to 60%. The  left ventricle has normal function. The left ventricle has no regional  wall motion abnormalities. There is mild left ventricular hypertrophy.  Left ventricular diastolic parameters  are indeterminate. The average left ventricular global longitudinal strain  is -21.9 %. The global longitudinal strain is normal.  2. Right ventricular systolic function is normal. The right ventricular  size is normal. Tricuspid regurgitation signal is inadequate for assessing  PA pressure.  3. The mitral valve is normal in structure. Trivial mitral valve  regurgitation. No evidence of mitral stenosis.  4. The aortic valve was not well visualized. Aortic valve regurgitation  is trivial.  5. The inferior vena cava is normal in size with greater than 50%  respiratory variability, suggesting right atrial pressure of 3 mmHg.    Neuro/Psych negative neurological ROS  negative psych ROS   GI/Hepatic Neg liver ROS, Hx CRC s/p colectomy 2018   Endo/Other  negative endocrine ROS  Renal/GU Renal InsufficiencyRenal diseaseCr 1.41  negative genitourinary   Musculoskeletal negative musculoskeletal ROS (+)   Abdominal   Peds  Hematology negative hematology ROS (+)   Anesthesia Other Findings Right breast ca   Reproductive/Obstetrics negative OB  ROS                            Anesthesia Physical Anesthesia Plan  ASA: 2  Anesthesia Plan: General and Regional   Post-op Pain Management: GA combined w/ Regional for post-op pain   Induction: Intravenous  PONV Risk Score and Plan: 2 and Ondansetron, Dexamethasone and Treatment may vary due to age or medical condition  Airway Management Planned: LMA  Additional Equipment: None  Intra-op Plan:   Post-operative Plan: Extubation in OR  Informed Consent: I have reviewed the patients History and Physical, chart, labs and discussed the procedure including the risks, benefits and alternatives for the proposed anesthesia with the patient or authorized representative who has indicated his/her understanding and acceptance.     Dental advisory given  Plan Discussed with: CRNA  Anesthesia Plan Comments:        Anesthesia Quick Evaluation

## 2020-12-30 ENCOUNTER — Encounter (HOSPITAL_BASED_OUTPATIENT_CLINIC_OR_DEPARTMENT_OTHER): Payer: Self-pay | Admitting: Surgery

## 2020-12-30 MED ORDER — TRAMADOL HCL 50 MG PO TABS: 50.0000 mg | ORAL_TABLET | Freq: Four times a day (QID) | ORAL | 0 refills | Status: DC | PRN

## 2020-12-30 NOTE — Addendum Note (Signed)
Addendum  created 12/30/20 1137 by Mariabella Nilsen, Ernesta Amble, CRNA   Charge Capture section accepted

## 2020-12-30 NOTE — Discharge Summary (Signed)
Physician Discharge Summary  Patient ID: Terrance Powell MRN: [id number] DOB/AGE: [id number] 84 y.o.  Admit date: 12/29/2020 Discharge date: 12/30/2020  Admission Diagnoses:  Discharge Diagnoses:  Active Problems:   S/P mastectomy, right   Discharged Condition: good  Hospital Course: uneventful post op course.  Discharged home pod #1  Consults: None  Significant Diagnostic Studies:   Treatments: surgery: right mastectomy with sentinel node biopsy  Discharge Exam: Blood pressure 133/77, pulse 75, temperature 97.7 F (36.5 C), resp. rate 16, height '5\' 10"'$  (1.778 m), weight 102.8 kg, SpO2 97 %. General appearance: alert, cooperative, and no distress Lungs clear Mastectomy flaps look good Drain serosang  Disposition: Discharge disposition: 01-Home or Self Care        Allergies as of 12/30/2020       Reactions   Penicillins Diarrhea, Itching, Other (See Comments)   TESTICULAR PAIN Has patient had a PCN reaction causing immediate rash, facial/tongue/throat swelling, SOB or lightheadedness with hypotension: No SEVERE RASH INVOLVING MUCUS MEMBRANES or SKIN NECROSIS: #  #  #  YES  #  #  #  Has patient had a PCN reaction that required hospitalization No Has patient had a PCN reaction occurring within the last 10 years: No.        Medication List     TAKE these medications    bumetanide 0.5 MG tablet Commonly known as: BUMEX Take 0.5 mg by mouth daily.   lisinopril 10 MG tablet Commonly known as: ZESTRIL Take 20 mg by mouth daily. Takes 2 tablets   traMADol 50 MG tablet Commonly known as: ULTRAM Take 1 tablet (50 mg total) by mouth every 6 (six) hours as needed.        Follow-up Information     [name], MD. Schedule an appointment as soon as possible for a visit on 01/09/2021.   Specialty: General Surgery Why: For suture removal  call the office for the time Contact information: [address] [place]  [phone]                 Signed: [name] 12/30/2020, 8:13 AM

## 2020-12-30 NOTE — Discharge Instructions (Addendum)
[place] Office Phone Number [phone]  BREAST BIOPSY/ PARTIAL MASTECTOMY: POST OP INSTRUCTIONS  Always review your discharge instruction sheet given to you by the facility where your surgery was performed.  IF YOU HAVE DISABILITY OR FAMILY LEAVE FORMS, YOU MUST BRING THEM TO THE OFFICE FOR PROCESSING.  DO NOT GIVE THEM TO YOUR DOCTOR.  A prescription for pain medication may be given to you upon discharge.  Take your pain medication as prescribed, if needed.  If narcotic pain medicine is not needed, then you may take acetaminophen (Tylenol) or ibuprofen (Advil) as needed. Take your usually prescribed medications unless otherwise directed If you need a refill on your pain medication, please contact your pharmacy.  They will contact our office to request authorization.  Prescriptions will not be filled after 5pm or on week-ends. You should eat very light the first 24 hours after surgery, such as soup, crackers, pudding, etc.  Resume your normal diet the day after surgery. Most patients will experience some swelling and bruising in the breast.  Ice packs and a good support bra will help.  Swelling and bruising can take several days to resolve.  It is common to experience some constipation if taking pain medication after surgery.  Increasing fluid intake and taking a stool softener will usually help or prevent this problem from occurring.  A mild laxative (Milk of Magnesia or Miralax) should be taken according to package directions if there are no bowel movements after 48 hours. Unless discharge instructions indicate otherwise, you may remove your bandages 24-48 hours after surgery, and you may shower at that time.  You may have steri-strips (small skin tapes) in place directly over the incision.  These strips should be left on the skin for 7-10 days.  If your surgeon used skin glue on the incision, you may shower in 24 hours.  The glue will flake off over the next 2-3 weeks.  Any  sutures or staples will be removed at the office during your follow-up visit. ACTIVITIES:  You may resume regular daily activities (gradually increasing) beginning the next day.  Wearing a good support bra or sports bra minimizes pain and swelling.  You may have sexual intercourse when it is comfortable. You may drive when you no longer are taking prescription pain medication, you can comfortably wear a seatbelt, and you can safely maneuver your car and apply brakes. RETURN TO WORK:  ______________________________________________________________________________________ Dennis Bast should see your doctor in the office for a follow-up appointment approximately two weeks after your surgery.  Your doctor's nurse will typically make your follow-up appointment when she calls you with your pathology report.  Expect your pathology report 2-3 business days after your surgery.  You may call to check if you do not hear from [place] after three days. OTHER INSTRUCTIONS: _______________________________________________________________________________________________ _____________________________________________________________________________________________________________________________________ _____________________________________________________________________________________________________________________________________ _____________________________________________________________________________________________________________________________________  WHEN TO CALL YOUR DOCTOR: Fever over 101.0 Nausea and/or vomiting. Extreme swelling or bruising. Continued bleeding from incision. Increased pain, redness, or drainage from the incision.  The clinic staff is available to answer your questions during regular business hours.  Please don't hesitate to call and ask to speak to one of the nurses for clinical concerns.  If you have a medical emergency, go to the nearest emergency room or call 911.  A surgeon from [facility] is always on call at the hospital.  For further questions, please visit [email or web address]    Post Anesthesia Home Care Instructions  Activity: Get plenty of rest for the remainder of  the day. A responsible individual must stay with you for 24 hours following the procedure.  For the next 24 hours, DO NOT: -Drive a car -Paediatric nurse -Drink alcoholic beverages -Take any medication unless instructed by your physician -Make any legal decisions or sign important papers.  Meals: Start with liquid foods such as gelatin or soup. Progress to regular foods as tolerated. Avoid greasy, spicy, heavy foods. If nausea and/or vomiting occur, drink only clear liquids until the nausea and/or vomiting subsides. Call your physician if vomiting continues.  Special Instructions/Symptoms: Your throat may feel dry or sore from the anesthesia or the breathing tube placed in your throat during surgery. If this causes discomfort, gargle with warm salt water. The discomfort should disappear within 24 hours.  If you had a scopolamine patch placed behind your ear for the management of post- operative nausea and/or vomiting:  1. The medication in the patch is effective for 72 hours, after which it should be removed.  Wrap patch in a tissue and discard in the trash. Wash hands thoroughly with soap and water. 2. You may remove the patch earlier than 72 hours if you experience unpleasant side effects which may include dry mouth, dizziness or visual disturbances. 3. Avoid touching the patch. Wash your hands with soap and water after contact with the patch.    Information for Discharge Teaching: EXPAREL (bupivacaine liposome injectable suspension)   Your surgeon or anesthesiologist gave you EXPAREL(bupivacaine) to help control your pain after surgery.  EXPAREL is a local anesthetic that provides pain relief by numbing the tissue around the surgical site. EXPAREL is designed to release pain  medication over time and can control pain for up to 72 hours. Depending on how you respond to EXPAREL, you may require less pain medication during your recovery.  Possible side effects: Temporary loss of sensation or ability to move in the area where bupivacaine was injected. Nausea, vomiting, constipation Rarely, numbness and tingling in your mouth or lips, lightheadedness, or anxiety may occur. Call your doctor right away if you think you may be experiencing any of these sensations, or if you have other questions regarding possible side effects.  Follow all other discharge instructions given to you by your surgeon or nurse. Eat a healthy diet and drink plenty of water or other fluids.  If you return to the hospital for any reason within 96 hours following the administration of EXPAREL, it is important for health care providers to know that you have received this anesthetic. A teal colored band has been placed on your arm with the date, time and amount of EXPAREL you have received in order to alert and inform your health care providers. Please leave this armband in place for the full 96 hours following administration, and then you may remove the band.      JP Drain [employer] this sheet to all of your post-operative appointments while you have your drains. Please measure your drains by CC's or ML's. Make sure you drain and measure your JP Drains 2 or 3 times per day. At the end of each day, add up totals for the left side and add up totals for the right side.    ( 9 am )     ( 3 pm )        ( 9 pm )                Date L  R  L  R  L  R  Total L/R

## 2020-12-31 LAB — SURGICAL PATHOLOGY

## 2021-01-01 ENCOUNTER — Encounter: Payer: Self-pay | Admitting: *Deleted

## 2021-01-14 NOTE — Progress Notes (Signed)
Pharmacist Chemotherapy Monitoring - Initial Assessment    Anticipated start date: 01/22/21   The following has been reviewed per standard work regarding the patient's treatment regimen: The patient's diagnosis, treatment plan and drug doses, and organ/hematologic function Lab orders and baseline tests specific to treatment regimen  The treatment plan start date, drug sequencing, and pre-medications Prior authorization status  Patient's documented medication list, including drug-drug interaction screen and prescriptions for anti-emetics and supportive care specific to the treatment regimen The drug concentrations, fluid compatibility, administration routes, and timing of the medications to be used The patient's access for treatment and lifetime cumulative dose history, if applicable  The patient's medication allergies and previous infusion related reactions, if applicable   Changes made to treatment plan:  N/A  Follow up needed:  Pending authorization for treatment    Larene Beach, [place], 01/14/2021  3:11 PM

## 2021-01-16 NOTE — Progress Notes (Signed)
The following biosimilar Kanjinti (trastuzumab-anns) has been selected for use in this patient.  Kennith Center, Pharm.D., CPP 01/16/2021'@11'$ :32 AM

## 2021-01-20 ENCOUNTER — Other Ambulatory Visit: Payer: Self-pay | Admitting: *Deleted

## 2021-01-20 ENCOUNTER — Other Ambulatory Visit: Payer: Self-pay | Admitting: Oncology

## 2021-01-22 ENCOUNTER — Encounter: Payer: Self-pay | Admitting: *Deleted

## 2021-01-22 ENCOUNTER — Inpatient Hospital Stay: Payer: Medicare Other

## 2021-01-22 ENCOUNTER — Other Ambulatory Visit: Payer: Self-pay

## 2021-01-22 ENCOUNTER — Inpatient Hospital Stay: Payer: Medicare Other | Admitting: Adult Health

## 2021-01-22 ENCOUNTER — Inpatient Hospital Stay: Payer: Medicare Other | Attending: Oncology

## 2021-01-22 VITALS — BP 160/76 | HR 76 | Temp 97.3°F | Resp 18 | Ht 70.0 in | Wt 219.3 lb

## 2021-01-22 DIAGNOSIS — Z5112 Encounter for antineoplastic immunotherapy: Secondary | ICD-10-CM | POA: Insufficient documentation

## 2021-01-22 DIAGNOSIS — Z170 Estrogen receptor positive status [ER+]: Secondary | ICD-10-CM | POA: Diagnosis not present

## 2021-01-22 DIAGNOSIS — Z87891 Personal history of nicotine dependence: Secondary | ICD-10-CM | POA: Diagnosis not present

## 2021-01-22 DIAGNOSIS — Z803 Family history of malignant neoplasm of breast: Secondary | ICD-10-CM | POA: Diagnosis not present

## 2021-01-22 DIAGNOSIS — C50121 Malignant neoplasm of central portion of right male breast: Secondary | ICD-10-CM | POA: Diagnosis not present

## 2021-01-22 DIAGNOSIS — Z79899 Other long term (current) drug therapy: Secondary | ICD-10-CM | POA: Diagnosis not present

## 2021-01-22 DIAGNOSIS — I10 Essential (primary) hypertension: Secondary | ICD-10-CM | POA: Diagnosis not present

## 2021-01-22 DIAGNOSIS — M109 Gout, unspecified: Secondary | ICD-10-CM | POA: Diagnosis not present

## 2021-01-22 DIAGNOSIS — Z800 Family history of malignant neoplasm of digestive organs: Secondary | ICD-10-CM | POA: Diagnosis not present

## 2021-01-22 DIAGNOSIS — K5900 Constipation, unspecified: Secondary | ICD-10-CM | POA: Diagnosis not present

## 2021-01-22 LAB — COMPREHENSIVE METABOLIC PANEL
ALT: 9 U/L (ref 0–44)
AST: 12 U/L — ABNORMAL LOW (ref 15–41)
Albumin: 3.9 g/dL (ref 3.5–5.0)
Alkaline Phosphatase: 77 U/L (ref 38–126)
Anion gap: 10 (ref 5–15)
BUN: 17 mg/dL (ref 8–23)
CO2: 23 mmol/L (ref 22–32)
Calcium: 9.5 mg/dL (ref 8.9–10.3)
Chloride: 109 mmol/L (ref 98–111)
Creatinine, Ser: 1.42 mg/dL — ABNORMAL HIGH (ref 0.61–1.24)
GFR, Estimated: 49 mL/min — ABNORMAL LOW (ref >60)
Glucose, Bld: 93 mg/dL (ref 70–99)
Potassium: 3.8 mmol/L (ref 3.5–5.1)
Sodium: 142 mmol/L (ref 135–145)
Total Bilirubin: 0.8 mg/dL (ref 0.3–1.2)
Total Protein: 7.2 g/dL (ref 6.5–8.1)

## 2021-01-22 LAB — CBC WITH DIFFERENTIAL/PLATELET
Abs Immature Granulocytes: 0.02 10*3/uL (ref 0.00–0.07)
Basophils Absolute: 0.1 10*3/uL (ref 0.0–0.1)
Basophils Relative: 1 %
Eosinophils Absolute: 0.3 10*3/uL (ref 0.0–0.5)
Eosinophils Relative: 3 %
HCT: 41.7 % (ref 39.0–52.0)
Hemoglobin: 14.5 g/dL (ref 13.0–17.0)
Immature Granulocytes: 0 %
Lymphocytes Relative: 24 %
Lymphs Abs: 2.2 10*3/uL (ref 0.7–4.0)
MCH: 30.8 pg (ref 26.0–34.0)
MCHC: 34.8 g/dL (ref 30.0–36.0)
MCV: 88.5 fL (ref 80.0–100.0)
Monocytes Absolute: 0.6 10*3/uL (ref 0.1–1.0)
Monocytes Relative: 7 %
Neutro Abs: 6.1 10*3/uL (ref 1.7–7.7)
Neutrophils Relative %: 65 %
Platelets: 301 10*3/uL (ref 150–400)
RBC: 4.71 MIL/uL (ref 4.22–5.81)
RDW: 13 % (ref 11.5–15.5)
WBC: 9.3 10*3/uL (ref 4.0–10.5)
nRBC: 0 % (ref 0.0–0.2)

## 2021-01-22 MED ORDER — SODIUM CHLORIDE 0.9 % IV SOLN: Freq: Once | INTRAVENOUS | Status: AC

## 2021-01-22 MED ORDER — ACETAMINOPHEN 325 MG PO TABS
650.0000 mg | ORAL_TABLET | Freq: Once | ORAL | Status: AC
  Administered 2021-01-22: 650 mg via ORAL
  Filled 2021-01-22: qty 2

## 2021-01-22 MED ORDER — DIPHENHYDRAMINE HCL 25 MG PO CAPS
50.0000 mg | ORAL_CAPSULE | Freq: Once | ORAL | Status: AC
  Administered 2021-01-22: 50 mg via ORAL
  Filled 2021-01-22: qty 2

## 2021-01-22 MED ORDER — TRASTUZUMAB-ANNS CHEMO 150 MG IV SOLR
750.0000 mg | Freq: Once | INTRAVENOUS | Status: AC
  Administered 2021-01-22: 750 mg via INTRAVENOUS
  Filled 2021-01-22: qty 35.72

## 2021-01-22 NOTE — Progress Notes (Addendum)
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 He underwent bilateral diagnostic mammography with tomography and right breast ultrasonography at [facility] on 11/24/2020 showing: breast density category B; 3.4 cm retroareolar right breast mass, contiguous with skin of nipple; no abnormal right axillary lymph nodes; benign left breast gynecomastia, no evidence left breast malignancy.  Accordingly on 11/28/2020, he proceeded to biopsy of the right breast area in question. The pathology from this procedure ([id number]) showed: invasive mammary carcinoma, e-cadherin positive, grade 1-2. Prognostic indicators significant for: estrogen receptor, >95% positive with strong staining intensity and progesterone receptor, 0% negative. Proliferation marker Ki67 at 15%. HER2 equivocal by immunohistochemistry (2+), but positive by fluorescent in situ hybridization with a signals ratio 2.36 and number per cell 5.21.  The patient's subsequent history is as detailed below.   INTERVAL HISTORY: Terrance Powell is here today for f/u accompanied by his wife today for his breast cancer.  He underwent a right sided mastectomy and sentinel  node biopsy that showed a grade 2, 3.4 cm invasive ductal carcinoma.  One sentinel node biopsied was negative.    Terrance Powell notes he tolerated surgery well and is healing well.  He denies any signifiicant or residual pain at the area.  He does note that he is self conscious about the area.    He brought several questions about his treatment plan, how it is dosed, how long it takes to receive it, and how it is administered.    REVIEW OF SYSTEMS: Review of Systems  Constitutional:  Negative for appetite change, chills, fatigue, fever and unexpected weight change.  HENT:   Negative for hearing loss, lump/mass and trouble swallowing.   Eyes:  Negative for eye problems and icterus.  Respiratory:  Negative for chest tightness, cough and shortness of breath.   Cardiovascular:  Negative for chest pain, leg swelling and palpitations.  Gastrointestinal:  Negative for abdominal distention, abdominal pain, constipation, diarrhea, nausea and vomiting.  Endocrine: Negative for hot flashes.  Genitourinary:  Negative for difficulty urinating.   Musculoskeletal:  Negative for arthralgias.  Skin:  Negative for itching and rash.  Neurological:  Negative for dizziness, extremity weakness, headaches and numbness.  Hematological:  Negative for adenopathy. Does not bruise/bleed easily.  Psychiatric/Behavioral:  Negative for depression. The patient is nervous/anxious (very mild, due to uncertainty about treatments).      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    PAST SURGICAL HISTORY: Past Surgical History:  Procedure Laterality Date   COLECTOMY     LAP ASSISTED ASCENDING  COLECTOMY/notes 09/08/2016   COLONOSCOPY W/ BIOPSIES     "last one was 08/09/2016"  (09/08/2016)   LAPAROSCOPIC RIGHT COLECTOMY N/A 09/08/2016   Procedure: LAP ASSISTED ASCENDING COLECTOMY;  Surgeon: [name], MD;  Location: [facility];  Service: General;  Laterality: N/A;   MASTECTOMY W/ SENTINEL NODE BIOPSY Right 12/29/2020   Procedure: RIGHT MASTECTOMY WITH SENTINEL LYMPH NODE BIOPSY;  Surgeon: [name], MD;  Location: [facility];  Service: General;  Laterality: Right;    FAMILY HISTORY: Family History  Problem Relation Age of Onset   Breast cancer Mother    Colon cancer Father        metastatic   Breast cancer Maternal Aunt        3 mat aunts all in 60-70s   Colon cancer Other    His father died at age 16 from metastatic colon cancer. He also reports ovarian cancer in his paternal grandmother.  The patient's father had 7 brothers and 2 sisters 1 brother had esophageal cancer and the other 1 had cancer of a type unknown to the patient.--On the maternal side the patient's mother died at the age of 52 with breast cancer.  She had 4 sisters some of whom had cancer but he is not sure what type.  His mother had 3 brothers none of whom had cancer.  He is also not aware of any cancer in any cousins on either side.  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 His wife [name] worked as a Primary school teacher for a time.  Their daughter [name] lives in [place] where she works for the [employer].  Their daughter [name] lives in [place] and works as a Dietitian.  The patient has 4 grandchildren.  He is not a church attender    ADVANCED DIRECTIVES: The patient's wife is his healthcare power of attorney   HEALTH MAINTENANCE: Social History   Tobacco Use   Smoking status: Former   Smokeless tobacco: Never   Tobacco comments:    "toyed with " none in over 40 years"  Vaping Use    Vaping Use: Never used  Substance Use Topics   Alcohol use: Yes    Comment: 09/08/2016 "might have a couple drinks on holidays"   Drug use: No     Colonoscopy: 2018, adenocarcinoma  PSA:   Bone density:    Allergies  Allergen Reactions   Penicillins Diarrhea, Itching and Other (See Comments)    TESTICULAR PAIN  Has patient had a PCN reaction causing immediate rash, facial/tongue/throat swelling, SOB or lightheadedness with hypotension: No SEVERE RASH INVOLVING MUCUS MEMBRANES or SKIN NECROSIS: #  #  #  YES  #  #  #  Has patient had a PCN reaction that required hospitalization No Has patient had a PCN reaction occurring within the last 10 years: No.     Current Outpatient Medications  Medication Sig Dispense Refill   bumetanide (BUMEX) 0.5 MG tablet Take 0.5 mg by mouth daily.     lisinopril (PRINIVIL,ZESTRIL) 10 MG tablet Take 20 mg by mouth daily. Takes 2 tablets     traMADol (ULTRAM) 50 MG tablet Take 1 tablet (50 mg total) by mouth every 6 (six) hours as needed. 20 tablet 0   No current facility-administered medications for this visit.    OBJECTIVE: African-American  man who appears younger than stated age  39:   01/22/21 0905  BP: (!) 160/76  Pulse: 76  Resp: 18  Temp: (!) 97.3 F (36.3 C)  SpO2: 98%     Body mass index is 31.47 kg/m.   Wt Readings from Last 3 Encounters:  01/22/21 219 lb 4.8 oz (99.5 kg)  12/29/20 226 lb 10.1 oz (102.8 kg)  12/15/20 223 lb 12.8 oz (101.5 kg)      ECOG FS:1 - Symptomatic but completely ambulatory GENERAL: Patient is a well appearing male in no acute distress HEENT:  Sclerae anicteric.  Oropharynx clear and moist. No ulcerations or evidence of oropharyngeal candidiasis. Neck is supple.  NODES:  No cervical, supraclavicular, or axillary lymphadenopathy palpated.  BREAST EXAM:  s/p right mastectomy, healing well, no sign of infection, no nodularity, left breast benign LUNGS:  Clear to auscultation bilaterally.  No wheezes or  rhonchi. HEART:  Regular rate and rhythm. No murmur appreciated. ABDOMEN:  Soft, nontender.  Positive, normoactive bowel sounds. No organomegaly palpated. MSK:  No focal spinal tenderness to palpation. Full range of motion bilaterally in the upper extremities. EXTREMITIES:  No peripheral edema.   SKIN:  Clear with no obvious rashes or skin changes. No nail dyscrasia. NEURO:  Nonfocal. Well oriented.  Appropriate affect.      LAB RESULTS:  CMP     Component Value Date/Time   NA 142 01/22/2021 0843   K 3.8 01/22/2021 0843   CL 109 01/22/2021 0843   CO2 23 01/22/2021 0843   GLUCOSE 93 01/22/2021 0843   BUN 17 01/22/2021 0843   CREATININE 1.42 (H) 01/22/2021 0843   CREATININE 1.45 (H) 12/15/2020 1544   CALCIUM 9.5 01/22/2021 0843   PROT 7.2 01/22/2021 0843   ALBUMIN 3.9 01/22/2021 0843   AST 12 (L) 01/22/2021 0843   AST 15 12/15/2020 1544   ALT 9 01/22/2021 0843   ALT 14 12/15/2020 1544   ALKPHOS 77 01/22/2021 0843   BILITOT 0.8 01/22/2021 0843   BILITOT 0.8 12/15/2020 1544   GFRNONAA 49 (L) 01/22/2021 0843   GFRNONAA 48 (L) 12/15/2020 1544   GFRAA 53 (L) 09/09/2016 0513    No results found for: TOTALPROTELP, ALBUMINELP, A1GS, A2GS, BETS, BETA2SER, GAMS, MSPIKE, SPEI  Lab Results  Component Value Date   WBC 9.3 01/22/2021   NEUTROABS 6.1 01/22/2021   HGB 14.5 01/22/2021   HCT 41.7 01/22/2021   MCV 88.5 01/22/2021   PLT 301 01/22/2021    No results found for: LABCA2  No components found for: VZCHYI502  No results for input(s): INR in the last 168 hours.  No results found for: LABCA2  No results found for: DXA128  No results found for: NOM767  No results found for: MCN470  Lab Results  Component Value Date   CA2729 26.2 12/15/2020    No components found for: HGQUANT  No results found for: CEA1 / No results found for: CEA1   No results found for: AFPTUMOR  No results found for: CHROMOGRNA  No results found for: KPAFRELGTCHN, LAMBDASER,  KAPLAMBRATIO (kappa/lambda light chains)  No results found for: HGBA, HGBA2QUANT, HGBFQUANT, HGBSQUAN (Hemoglobinopathy evaluation)   No results found for: LDH  No results found for: IRON, TIBC, IRONPCTSAT (Iron and TIBC)  No results found for: FERRITIN  Urinalysis No results found for: COLORURINE, APPEARANCEUR, LABSPEC, PHURINE, GLUCOSEU, HGBUR, BILIRUBINUR, KETONESUR, PROTEINUR, UROBILINOGEN, NITRITE, LEUKOCYTESUR   STUDIES: NM Sentinel Node Inj-No Rpt (Breast)  Result Date: 12/29/2020 Sulfur Colloid was injected  by the Nuclear Medicine Technologist for sentinel lymph node localization.     ELIGIBLE FOR AVAILABLE RESEARCH PROTOCOL: no  ASSESSMENT: 86 y.o. [place] man status post right central breast (retroareolar) biopsy 11/28/2020 for a clinical T2-T4, N0, stage IIA-IIIB invasive ductal carcinoma, estrogen receptor positive, progesterone receptor negative, HER2 amplified, with an MIB-1 of 15%  (1) genetics testing pending  (2) Patient underwent right mastectomy on 12/30/2020 that showed T2, N0, stage IIA invasive ductal carcinoma, grade 2, with negative margins  (A) a total of one SLN was biopsied and negative  (3) likely no adjuvant radiation-- confirmation pending  (4) anti-HER2 treatment; Herceptin IV every 3 weeks  (A) echo on 12/26/2020 shows EF of 55-60%  (5) tamoxifen to start October 2022  PLAN:  Terrance Powell and I reviewed in detail his surgical pathology results.  I reinforced his last visit with Dr. Jana Hakim which reviewed how Terrance Powell's breast cancer has three protein receptors that we can target, ER/PR and HER-2.  I reviewed that Herceptin is an immunotherapy, which targets the HER-2 protein.  We discussed that this is very well tolerated and the main side effect we monitor for is weakening in the muscle of the heart.  We discussed that we will do this every 3 months with an echocardiogram, and I reviewed that his most recent echo on 12/26/2020 was normal.  We  reviewed how herceptin is different than chemotherapy, the reasons for his premedications, and the length of time he will be in the chemo room.    We discussed tamoxifen in detail, and that he will start on that in 6 weeks and will talk with [place] further at that time.    Terrance Powell met with Dr. Jana Hakim and reviewed the above as well.  I placed an order for genetics referral and messaged Dr. Sondra Come inquiring about whether Terrance Powell will need radiation therapy.  He will return every 3 weeks for herceptin, and we will see him with every other treatment.  She knows to call for any questions that may arise between now and her next appointment.  We are happy to see her sooner if needed.  Total encounter time: 65 minutes  Wilber Bihari, NP 01/26/21 7:47 AM Medical Oncology and Hematology [facility] [place], [place] [address] Tel. [phone]    Fax. [phone]   ADDENDUM: Terrance Powell's cancer proved to be node-negative.  His surgery achieved negative margins.  His tumor is HER2 and estrogen receptor positive.  I do not anticipate his needing adjuvant radiation and I have sent a note to his radiation physician Dr. Sondra Come to make sure he is in agreement.  Assuming that is the case we will start tamoxifen with Terrance Powell's next Herceptin dose.  The plan will be to continue the Herceptin for 6 months and the tamoxifen for 5 years.  I personally saw this patient and performed a substantive portion of this encounter with the listed APP documented above.   Chauncey Cruel, MD Medical Oncology and Hematology [facility] [address] [place],  [address] Tel. [phone]    Fax. [phone]     *Total Encounter Time as defined by the Centers for Medicare and Medicaid Services includes, in addition to the face-to-face time of a patient visit (documented in the note above) non-face-to-face time: obtaining and reviewing outside history, ordering and  reviewing medications, tests or procedures, care coordination (communications with other health care professionals or caregivers) and documentation in the medical record.

## 2021-01-22 NOTE — Patient Instructions (Signed)
[facility] MEDICAL ONCOLOGY  Discharge Instructions: Thank you for choosing [facility] to provide your oncology and hematology care.   If you have a lab appointment with the Cancer Center, please go directly to the Cancer Center and check in at the registration area.   Wear comfortable clothing and clothing appropriate for easy access to any Portacath or PICC line.   We strive to give you quality time with your provider. You may need to reschedule your appointment if you arrive late (15 or more minutes).  Arriving late affects you and other patients whose appointments are after yours.  Also, if you miss three or more appointments without notifying the office, you may be dismissed from the clinic at the provider's discretion.      For prescription refill requests, have your pharmacy contact our office and allow 72 hours for refills to be completed.    Today you received the following chemotherapy and/or immunotherapy agents trastuzumab      To help prevent nausea and vomiting after your treatment, we encourage you to take your nausea medication as directed.  BELOW ARE SYMPTOMS THAT SHOULD BE REPORTED IMMEDIATELY: *FEVER GREATER THAN 100.4 F (38 C) OR HIGHER *CHILLS OR SWEATING *NAUSEA AND VOMITING THAT IS NOT CONTROLLED WITH YOUR NAUSEA MEDICATION *UNUSUAL SHORTNESS OF BREATH *UNUSUAL BRUISING OR BLEEDING *URINARY PROBLEMS (pain or burning when urinating, or frequent urination) *BOWEL PROBLEMS (unusual diarrhea, constipation, pain near the anus) TENDERNESS IN MOUTH AND THROAT WITH OR WITHOUT PRESENCE OF ULCERS (sore throat, sores in mouth, or a toothache) UNUSUAL RASH, SWELLING OR PAIN  UNUSUAL VAGINAL DISCHARGE OR ITCHING   Items with * indicate a potential emergency and should be followed up as soon as possible or go to the Emergency Department if any problems should occur.  Please show the CHEMOTHERAPY ALERT CARD or IMMUNOTHERAPY ALERT CARD at check-in to  the Emergency Department and triage nurse.  Should you have questions after your visit or need to cancel or reschedule your appointment, please contact [facility] MEDICAL [facility]COLOG[facility]  Dept: [phone]  and follow the prompts.  Office hours are 8:00 a.m. to 4:30 p.m. Monday - Friday. Please note that voicemails left after 4:00 p.m. may not be returned until the following business day.  We are closed weekends and major holidays. You have access to a nurse at all times for urgent questions. Please call the main number to the clinic Dept: [phone] and follow the prompts.   For any non-urgent questions, you may also contact your provider using MyChart. We now offer e-Visits for anyone 18 and older to request care online for non-urgent symptoms. For details visit [email or web address].   Also download the MyChart app! Go to the app store, search "MyChart", open the app, select [facility], and log in with your MyChart username and password.  Due to Covid, a mask is required upon entering the hospital/clinic. If you do not have a mask, one will be given to you upon arrival. For doctor visits, patients may have 1 support person aged 18 or older with them. For treatment visits, patients cannot have anyone with them due to current Covid guidelines and our immunocompromised population.   

## 2021-01-23 ENCOUNTER — Telehealth: Payer: Self-pay | Admitting: Adult Health

## 2021-01-23 NOTE — Telephone Encounter (Signed)
Scheduled appt per 9/8 los  - pt is aware of next scheduled appt on 9/29

## 2021-01-24 ENCOUNTER — Encounter: Payer: Self-pay | Admitting: Adult Health

## 2021-01-26 ENCOUNTER — Encounter: Payer: Self-pay | Admitting: Oncology

## 2021-01-26 ENCOUNTER — Encounter: Payer: Self-pay | Admitting: *Deleted

## 2021-01-26 ENCOUNTER — Telehealth: Payer: Self-pay | Admitting: Nurse Practitioner

## 2021-01-26 NOTE — Telephone Encounter (Signed)
Scheduled per los. Called and left msg  ° °

## 2021-02-02 ENCOUNTER — Other Ambulatory Visit: Payer: Self-pay

## 2021-02-02 ENCOUNTER — Other Ambulatory Visit: Payer: Self-pay | Admitting: Oncology

## 2021-02-02 ENCOUNTER — Inpatient Hospital Stay: Payer: Medicare Other

## 2021-02-02 ENCOUNTER — Other Ambulatory Visit: Payer: Self-pay | Admitting: Genetic Counselor

## 2021-02-02 ENCOUNTER — Inpatient Hospital Stay (HOSPITAL_BASED_OUTPATIENT_CLINIC_OR_DEPARTMENT_OTHER): Payer: Medicare Other | Admitting: Genetic Counselor

## 2021-02-02 DIAGNOSIS — Z170 Estrogen receptor positive status [ER+]: Secondary | ICD-10-CM

## 2021-02-02 DIAGNOSIS — Z800 Family history of malignant neoplasm of digestive organs: Secondary | ICD-10-CM | POA: Diagnosis not present

## 2021-02-02 DIAGNOSIS — C50121 Malignant neoplasm of central portion of right male breast: Secondary | ICD-10-CM

## 2021-02-02 DIAGNOSIS — Z8041 Family history of malignant neoplasm of ovary: Secondary | ICD-10-CM

## 2021-02-02 DIAGNOSIS — C184 Malignant neoplasm of transverse colon: Secondary | ICD-10-CM

## 2021-02-02 DIAGNOSIS — Z803 Family history of malignant neoplasm of breast: Secondary | ICD-10-CM

## 2021-02-02 DIAGNOSIS — C50929 Malignant neoplasm of unspecified site of unspecified male breast: Secondary | ICD-10-CM

## 2021-02-02 LAB — CBC WITH DIFFERENTIAL/PLATELET
Abs Immature Granulocytes: 0.02 10*3/uL (ref 0.00–0.07)
Basophils Absolute: 0.1 10*3/uL (ref 0.0–0.1)
Basophils Relative: 1 %
Eosinophils Absolute: 0.3 10*3/uL (ref 0.0–0.5)
Eosinophils Relative: 4 %
HCT: 39.9 % (ref 39.0–52.0)
Hemoglobin: 13.9 g/dL (ref 13.0–17.0)
Immature Granulocytes: 0 %
Lymphocytes Relative: 29 %
Lymphs Abs: 2.5 10*3/uL (ref 0.7–4.0)
MCH: 31.2 pg (ref 26.0–34.0)
MCHC: 34.8 g/dL (ref 30.0–36.0)
MCV: 89.5 fL (ref 80.0–100.0)
Monocytes Absolute: 0.7 10*3/uL (ref 0.1–1.0)
Monocytes Relative: 8 %
Neutro Abs: 5.1 10*3/uL (ref 1.7–7.7)
Neutrophils Relative %: 58 %
Platelets: 276 10*3/uL (ref 150–400)
RBC: 4.46 MIL/uL (ref 4.22–5.81)
RDW: 13.1 % (ref 11.5–15.5)
WBC: 8.8 10*3/uL (ref 4.0–10.5)
nRBC: 0 % (ref 0.0–0.2)

## 2021-02-02 LAB — COMPREHENSIVE METABOLIC PANEL
ALT: 23 U/L (ref 0–44)
AST: 16 U/L (ref 15–41)
Albumin: 3.9 g/dL (ref 3.5–5.0)
Alkaline Phosphatase: 82 U/L (ref 38–126)
Anion gap: 11 (ref 5–15)
BUN: 17 mg/dL (ref 8–23)
CO2: 25 mmol/L (ref 22–32)
Calcium: 9.5 mg/dL (ref 8.9–10.3)
Chloride: 107 mmol/L (ref 98–111)
Creatinine, Ser: 1.36 mg/dL — ABNORMAL HIGH (ref 0.61–1.24)
GFR, Estimated: 51 mL/min — ABNORMAL LOW (ref >60)
Glucose, Bld: 93 mg/dL (ref 70–99)
Potassium: 4 mmol/L (ref 3.5–5.1)
Sodium: 143 mmol/L (ref 135–145)
Total Bilirubin: 0.7 mg/dL (ref 0.3–1.2)
Total Protein: 7.2 g/dL (ref 6.5–8.1)

## 2021-02-02 LAB — GENETIC SCREENING ORDER

## 2021-02-02 NOTE — Progress Notes (Signed)
Discussed with radiation ([facility]) who confirms adjuvant radiation not needed in this case

## 2021-02-04 ENCOUNTER — Encounter: Payer: Self-pay | Admitting: Genetic Counselor

## 2021-02-04 ENCOUNTER — Encounter: Payer: Self-pay | Admitting: Oncology

## 2021-02-04 DIAGNOSIS — Z803 Family history of malignant neoplasm of breast: Secondary | ICD-10-CM

## 2021-02-04 DIAGNOSIS — Z800 Family history of malignant neoplasm of digestive organs: Secondary | ICD-10-CM | POA: Insufficient documentation

## 2021-02-04 DIAGNOSIS — Z8041 Family history of malignant neoplasm of ovary: Secondary | ICD-10-CM

## 2021-02-04 HISTORY — DX: Family history of malignant neoplasm of ovary: Z80.41

## 2021-02-04 HISTORY — DX: Family history of malignant neoplasm of digestive organs: Z80.0

## 2021-02-04 HISTORY — DX: Family history of malignant neoplasm of breast: Z80.3

## 2021-02-04 NOTE — Progress Notes (Signed)
REFERRING PROVIDER: Chauncey Cruel, MD [address] [place],   [address]  PRIMARY PROVIDER:  Orpah Melter, MD  PRIMARY REASON FOR VISIT:  1. 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 Family history of breast cancer   4. Family history of colon cancer   5. Family history of ovarian cancer     HISTORY OF PRESENT ILLNESS:   Terrance Powell, a 86 y.o. male, was seen for a [facility] cancer genetics consultation at the request of Dr. Jana Hakim due to a personal and family history of cancer.  Terrance Powell presents to clinic today to discuss the possibility of a hereditary predisposition to cancer, to discuss genetic testing, and to further clarify his future cancer risks, as well as potential cancer risks for family members.   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  Colonoscopy: see history noted above  Past Medical History:  Diagnosis Date   Constipation    Family history of breast cancer 02/04/2021   Family history of colon cancer  02/04/2021   Family history of ovarian cancer 02/04/2021   Head injury ~ 1942   loss of consciouss   History of gout    Hypertension    Neoplasm of uncertain behavior of transverse colon 09/08/2016    Past Surgical History:  Procedure Laterality Date   COLECTOMY     LAP ASSISTED ASCENDING COLECTOMY/notes 09/08/2016   COLONOSCOPY W/ BIOPSIES     "last one was 08/09/2016" (09/08/2016)   LAPAROSCOPIC RIGHT COLECTOMY N/A 09/08/2016   Procedure: LAP ASSISTED ASCENDING COLECTOMY;  Surgeon: [name], MD;  Location: [facility];  Service: General;  Laterality: N/A;   MASTECTOMY W/ SENTINEL NODE BIOPSY Right 12/29/2020   Procedure: RIGHT MASTECTOMY WITH SENTINEL LYMPH NODE BIOPSY;  Surgeon: [name], MD;  Location: [facility];  Service: General;  Laterality: Right;    Social History   Socioeconomic History   Marital status: Married    Spouse name: Not on file   Number of children: Not on file   Years of education: Not on file   Highest education level: Not on file  Occupational History   Not on file  Tobacco Use   Smoking status: Former   Smokeless tobacco: Never   Tobacco comments:    "toyed with " none in over 40 years"  Vaping Use   Vaping Use: Never used  Substance and Sexual Activity   Alcohol use: Yes    Comment: 09/08/2016 "might have a couple drinks on holidays"  Drug use: No   Sexual activity: Not Currently  Other Topics Concern   Not on file  Social History Narrative   Not on file   Social Determinants of Health   Financial Resource Strain: Not on file  Food Insecurity: Not on file  Transportation Needs: Not on file  Physical Activity: Not on file  Stress: Not on file  Social Connections: Not on file     FAMILY HISTORY:  We obtained a detailed, 4-generation family history.  Significant diagnoses are listed below: Family History  Problem Relation Age of Onset   Breast cancer Mother        dx 70s-70s   Colon cancer Father 8        metastatic   Breast cancer Maternal Aunt        x2 maternal aunts; dx 73s   Cancer Paternal Aunt        unknown type; d. 102s   Esophageal cancer Paternal Uncle        d. 42s   Cancer Paternal Uncle        unknown type; d. 24s   Ovarian cancer Paternal Grandmother        d. late 40s-early 94s    Terrance Powell is unaware of previous family history of genetic testing for hereditary cancer risks. Patient's ancestors are of [place] and [place] descent. There is no reported Ashkenazi Jewish ancestry. There is no known consanguinity.  GENETIC COUNSELING ASSESSMENT: Terrance Powell is a 86 y.o. male with a personal and family history of cancer which is somewhat suggestive of a hereditary cancer syndrome and predisposition to cancer given his diagnosis of male breast cancer and the presence of related cancers in his family. We, therefore, discussed and recommended the following at today's visit.   DISCUSSION: We discussed that 5 - 10% of cancer is hereditary, with most cases of hereditary breast cancer associated with mutations in BRCA1/2.  There are other genes that can be associated with hereditary breast, colon, and ovarian cancer syndromes.  Type of cancer risk and level of risk is gene-specific.  We discussed that testing is beneficial for several reasons including knowing how to follow individuals after completing their treatment, identifying whether potential treatment options would be beneficial, and understanding if other family members could be at risk for cancer and allowing them to undergo genetic testing.   We reviewed the characteristics, features and inheritance patterns of hereditary cancer syndromes. We also discussed genetic testing, including the appropriate family members to test, the process of testing, insurance coverage and turn-around-time for results. We discussed the implications of a negative, positive, carrier and/or variant of uncertain significant result. We recommended Terrance Powell  pursue genetic testing for a panel that includes genes associated with breast, colon, and ovarian cancer.   Terrance Powell  was offered a common hereditary cancer panel (47 genes) and an expanded pan-cancer panel (77 genes). Terrance Powell was informed of the benefits and limitations of each panel, including that expanded pan-cancer panels contain genes that do not have clear management guidelines at this point in time.  We also discussed that as the number of genes included on a panel increases, the chances of variants of uncertain significance increases.  After considering the benefits and limitations of each gene panel, Terrance Powell  elected to have expanded pan-cancer panel through [place].  The CancerNext-Expanded gene panel offered by [facility] and includes sequencing, rearrangement, and RNA analysis for the following 77 genes: AIP, ALK, APC, ATM, AXIN2, BAP1,  BARD1, BLM, BMPR1A, BRCA1, BRCA2, BRIP1, CDC73, CDH1, CDK4, CDKN1B, CDKN2A, CHEK2, CTNNA1, DICER1, FANCC, FH, FLCN, GALNT12, KIF1B, LZTR1, MAX, MEN1, MET, MLH1, MSH2, MSH3, MSH6, MUTYH, NBN, NF1, NF2, NTHL1, PALB2, PHOX2B, PMS2, POT1, PRKAR1A, PTCH1, PTEN, RAD51C, RAD51D, RB1, RECQL, RET, SDHA, SDHAF2, SDHB, SDHC, SDHD, SMAD4, SMARCA4, SMARCB1, SMARCE1, STK11, SUFU, TMEM127, TP53, TSC1, TSC2, VHL and XRCC2 (sequencing and deletion/duplication); EGFR, EGLN1, HOXB13, KIT, MITF, PDGFRA, POLD1, and POLE (sequencing only); EPCAM and GREM1 (deletion/duplication only).   Based on Terrance Powell personal history of cancer, he meets medical criteria for genetic testing. Despite that he meets criteria, he may still have an out of pocket cost. We discussed that if he has an out of pocket cost for testing over $100, the laboratory should reach out to him to discuss self-pay options and patient pay assistance programs.   PLAN: After considering the risks, benefits, and limitations, Terrance Powell provided informed consent to pursue genetic testing and the blood sample was  sent to [facility] for analysis of the CancerNext-Expanded +RNAinsight Panel. Results should be available within approximately 3 weeks' time, at which point they will be disclosed by telephone to Terrance Powell, as will any additional recommendations warranted by these results. Terrance Powell will receive a summary of his genetic counseling visit and a copy of his results once available. This information will also be available in Epic.    Lastly, we encouraged Terrance Powell to remain in contact with cancer genetics annually so that we can continuously update the family history and inform him of any changes in cancer genetics and testing that may be of benefit for this family.   Mr. Kupper questions were answered to his satisfaction today. Our contact information was provided should additional questions or concerns arise. Thank you for the referral and allowing [place] to share in the care of your patient.   Cari M. Joette Catching, [place], [facility] Genetic Counselor [email or web address] (P) [phone]  The patient was seen for a total of 35 minutes in face-to-face genetic counseling.  The patient was seen alone.  Drs. Magrinat, Lindi Adie and/or Burr Medico were available to discuss this case as needed.    _______________________________________________________________________ For Office Staff:  Number of people involved in session: 1 Was an Intern/ student involved with case: yes; [employer] intern, Raymond Gurney, conducted this session under my supervision

## 2021-02-12 ENCOUNTER — Other Ambulatory Visit: Payer: Self-pay

## 2021-02-12 ENCOUNTER — Inpatient Hospital Stay: Payer: Medicare Other

## 2021-02-12 VITALS — BP 158/72 | HR 72 | Temp 98.0°F | Resp 18

## 2021-02-12 DIAGNOSIS — C50121 Malignant neoplasm of central portion of right male breast: Secondary | ICD-10-CM | POA: Diagnosis not present

## 2021-02-12 DIAGNOSIS — Z170 Estrogen receptor positive status [ER+]: Secondary | ICD-10-CM

## 2021-02-12 LAB — COMPREHENSIVE METABOLIC PANEL
ALT: 12 U/L (ref 0–44)
AST: 13 U/L — ABNORMAL LOW (ref 15–41)
Albumin: 3.9 g/dL (ref 3.5–5.0)
Alkaline Phosphatase: 86 U/L (ref 38–126)
Anion gap: 11 (ref 5–15)
BUN: 17 mg/dL (ref 8–23)
CO2: 23 mmol/L (ref 22–32)
Calcium: 9.3 mg/dL (ref 8.9–10.3)
Chloride: 108 mmol/L (ref 98–111)
Creatinine, Ser: 1.38 mg/dL — ABNORMAL HIGH (ref 0.61–1.24)
GFR, Estimated: 50 mL/min — ABNORMAL LOW (ref >60)
Glucose, Bld: 95 mg/dL (ref 70–99)
Potassium: 3.7 mmol/L (ref 3.5–5.1)
Sodium: 142 mmol/L (ref 135–145)
Total Bilirubin: 1.1 mg/dL (ref 0.3–1.2)
Total Protein: 7.3 g/dL (ref 6.5–8.1)

## 2021-02-12 LAB — CBC WITH DIFFERENTIAL/PLATELET
Abs Immature Granulocytes: 0.02 10*3/uL (ref 0.00–0.07)
Basophils Absolute: 0.1 10*3/uL (ref 0.0–0.1)
Basophils Relative: 1 %
Eosinophils Absolute: 0.3 10*3/uL (ref 0.0–0.5)
Eosinophils Relative: 3 %
HCT: 38.4 % — ABNORMAL LOW (ref 39.0–52.0)
Hemoglobin: 13.8 g/dL (ref 13.0–17.0)
Immature Granulocytes: 0 %
Lymphocytes Relative: 24 %
Lymphs Abs: 2.2 10*3/uL (ref 0.7–4.0)
MCH: 31.7 pg (ref 26.0–34.0)
MCHC: 35.9 g/dL (ref 30.0–36.0)
MCV: 88.1 fL (ref 80.0–100.0)
Monocytes Absolute: 0.7 10*3/uL (ref 0.1–1.0)
Monocytes Relative: 8 %
Neutro Abs: 5.8 10*3/uL (ref 1.7–7.7)
Neutrophils Relative %: 64 %
Platelets: 276 10*3/uL (ref 150–400)
RBC: 4.36 MIL/uL (ref 4.22–5.81)
RDW: 12.8 % (ref 11.5–15.5)
WBC: 9.1 10*3/uL (ref 4.0–10.5)
nRBC: 0 % (ref 0.0–0.2)

## 2021-02-12 MED ORDER — DIPHENHYDRAMINE HCL 25 MG PO CAPS
ORAL_CAPSULE | ORAL | Status: AC
  Filled 2021-02-12: qty 2

## 2021-02-12 MED ORDER — DIPHENHYDRAMINE HCL 25 MG PO CAPS
50.0000 mg | ORAL_CAPSULE | Freq: Once | ORAL | Status: AC
  Administered 2021-02-12: 50 mg via ORAL

## 2021-02-12 MED ORDER — ACETAMINOPHEN 325 MG PO TABS
ORAL_TABLET | ORAL | Status: AC
  Filled 2021-02-12: qty 2

## 2021-02-12 MED ORDER — ACETAMINOPHEN 325 MG PO TABS
650.0000 mg | ORAL_TABLET | Freq: Once | ORAL | Status: AC
  Administered 2021-02-12: 650 mg via ORAL

## 2021-02-12 MED ORDER — TRASTUZUMAB-ANNS CHEMO 150 MG IV SOLR
6.0000 mg/kg | Freq: Once | INTRAVENOUS | Status: AC
  Administered 2021-02-12: 609 mg via INTRAVENOUS
  Filled 2021-02-12: qty 29

## 2021-02-12 MED ORDER — SODIUM CHLORIDE 0.9 % IV SOLN
Freq: Once | INTRAVENOUS | Status: AC
Start: 2021-02-12 — End: 2021-02-12

## 2021-02-12 NOTE — Patient Instructions (Signed)
[facility]ICAL [facility]COL[facility]  Discharge Instructions: Thank you for choosing [facility] to provide your oncology and hematology care.   If you have a lab appointment with the Cancer Center, please go directly to the Cancer Center and check in at the registration area.   Wear comfortable clothing and clothing appropriate for easy access to any Portacath or PICC line.   We strive to give you quality time with your provider. You may need to reschedule your appointment if you arrive late (15 or more minutes).  Arriving late affects you and other patients whose appointments are after yours.  Also, if you miss three or more appointments without notifying the office, you may be dismissed from the clinic at the provider's discretion.      For prescription refill requests, have your pharmacy contact our office and allow 72 hours for refills to be completed.    Today you received the following chemotherapy and/or immunotherapy agents trastuzumab      To help prevent nausea and vomiting after your treatment, we encourage you to take your nausea medication as directed.  BELOW ARE SYMPTOMS THAT SHOULD BE REPORTED IMMEDIATELY: *FEVER GREATER THAN 100.4 F (38 C) OR HIGHER *CHILLS OR SWEATING *NAUSEA AND VOMITING THAT IS NOT CONTROLLED WITH YOUR NAUSEA MEDICATION *UNUSUAL SHORTNESS OF BREATH *UNUSUAL BRUISING OR BLEEDING *URINARY PROBLEMS (pain or burning when urinating, or frequent urination) *BOWEL PROBLEMS (unusual diarrhea, constipation, pain near the anus) TENDERNESS IN MOUTH AND THROAT WITH OR WITHOUT PRESENCE OF ULCERS (sore throat, sores in mouth, or a toothache) UNUSUAL RASH, SWELLING OR PAIN  UNUSUAL VAGINAL DISCHARGE OR ITCHING   Items with * indicate a potential emergency and should be followed up as soon as possible or go to the Emergency Department if any problems should occur.  Please show the CHEMOTHERAPY ALERT CARD or IMMUNOTHERAPY ALERT CARD at check-in to  the Emergency Department and triage nurse.  Should you have questions after your visit or need to cancel or reschedule your appointment, please contact [facility]  Dept: [phone]  and follow the prompts.  Office hours are 8:00 a.m. to 4:30 p.m. Monday - Friday. Please note that voicemails left after 4:00 p.m. may not be returned until the following business day.  We are closed weekends and major holidays. You have access to a nurse at all times for urgent questions. Please call the main number to the clinic Dept: [phone] and follow the prompts.   For any non-urgent questions, you may also contact your provider using MyChart. We now offer e-Visits for anyone 18 and older to request care online for non-urgent symptoms. For details visit [email or web address].   Also download the MyChart app! Go to the app store, search "MyChart", open the app, select [facility], and log in with your MyChart username and password.  Due to Covid, a mask is required upon entering the hospital/clinic. If you do not have a mask, one will be given to you upon arrival. For doctor visits, patients may have 1 support person aged 18 or older with them. For treatment visits, patients cannot have anyone with them due to current Covid guidelines and our immunocompromised population.   

## 2021-02-16 ENCOUNTER — Telehealth: Payer: Self-pay | Admitting: Genetic Counselor

## 2021-02-16 ENCOUNTER — Ambulatory Visit: Payer: Self-pay | Admitting: Genetic Counselor

## 2021-02-16 ENCOUNTER — Encounter: Payer: Self-pay | Admitting: Genetic Counselor

## 2021-02-16 DIAGNOSIS — Z1379 Encounter for other screening for genetic and chromosomal anomalies: Secondary | ICD-10-CM | POA: Insufficient documentation

## 2021-02-16 DIAGNOSIS — Z800 Family history of malignant neoplasm of digestive organs: Secondary | ICD-10-CM

## 2021-02-16 DIAGNOSIS — Z170 Estrogen receptor positive status [ER+]: Secondary | ICD-10-CM

## 2021-02-16 DIAGNOSIS — C50121 Malignant neoplasm of central portion of right male breast: Secondary | ICD-10-CM

## 2021-02-16 DIAGNOSIS — Z8041 Family history of malignant neoplasm of ovary: Secondary | ICD-10-CM

## 2021-02-16 DIAGNOSIS — Z803 Family history of malignant neoplasm of breast: Secondary | ICD-10-CM

## 2021-02-16 NOTE — Telephone Encounter (Signed)
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  Discussed that we do not know why he has a history of colon and breast cancer or why there is cancer in the family. It could be familial, due to a different gene that we are not testing, or maybe our current technology may not be able to pick something up.  It will be important for him to keep in contact with genetics to keep up with whether additional testing may be needed.

## 2021-02-16 NOTE — Progress Notes (Signed)
HPI:  Terrance Powell was previously seen in the [facility] clinic due to a personal and family history of cancer and concerns regarding a hereditary predisposition to cancer. Please refer to our prior cancer genetics clinic note for more information regarding our discussion, assessment and recommendations, at the time. Terrance Powell recent genetic test results were disclosed to him, as were recommendations warranted by these results. These results and recommendations are discussed in more detail below.  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  The report date is February 13, 2021.   The CancerNext-Expanded gene panel offered by [facility] and includes sequencing, rearrangement, and RNA analysis for the following 77 genes: AIP, ALK, APC, ATM, AXIN2, BAP1, BARD1, BLM, BMPR1A, BRCA1, BRCA2, BRIP1, CDC73, CDH1, CDK4, CDKN1B, CDKN2A, CHEK2, CTNNA1, DICER1, FANCC, FH, FLCN, GALNT12, KIF1B,  LZTR1, MAX, MEN1, MET, MLH1, MSH2, MSH3, MSH6, MUTYH, NBN, NF1, NF2, NTHL1, PALB2, PHOX2B, PMS2, POT1, PRKAR1A, PTCH1, PTEN, RAD51C, RAD51D, RB1, RECQL, RET, SDHA, SDHAF2, SDHB, SDHC, SDHD, SMAD4, SMARCA4, SMARCB1, SMARCE1, STK11, SUFU, TMEM127, TP53, TSC1, TSC2, VHL and XRCC2 (sequencing and deletion/duplication); EGFR, EGLN1, HOXB13, KIT, MITF, PDGFRA, POLD1, and POLE (sequencing only); EPCAM and GREM1 (deletion/duplication only).      FAMILY HISTORY:  We obtained a detailed, 4-generation family history.  Significant diagnoses are listed below:      Family History  Problem Relation Age of Onset   Breast cancer Mother          dx 67s-70s   Colon cancer Father 43        metastatic   Breast cancer Maternal Aunt          x2 maternal aunts; dx 70s   Cancer Paternal Aunt          unknown type; d. 74s   Esophageal cancer Paternal Uncle          d. 70s   Cancer Paternal Uncle          unknown type; d. 17s   Ovarian cancer Paternal Grandmother          d. late 64s-early 46s    Terrance Powell is unaware of previous family history of genetic testing for hereditary cancer risks. Patient's ancestors are of [place] and [place] descent. There is no reported Ashkenazi Jewish ancestry. There is no known consanguinity.  GENETIC TEST RESULTS: Genetic testing reported out on February 13, 2021. The CancerNext-Expanded +RNAinsight Panel found no pathogenic mutations. The CancerNext-Expanded gene panel offered by [facility] and includes sequencing,  rearrangement, and RNA analysis for the following 77 genes: AIP, ALK, APC, ATM, AXIN2, BAP1, BARD1, BLM, BMPR1A, BRCA1, BRCA2, BRIP1, CDC73, CDH1, CDK4, CDKN1B, CDKN2A, CHEK2, CTNNA1, DICER1, FANCC, FH, FLCN, GALNT12, KIF1B, LZTR1, MAX, MEN1, MET, MLH1, MSH2, MSH3, MSH6, MUTYH, NBN, NF1, NF2, NTHL1, PALB2, PHOX2B, PMS2, POT1, PRKAR1A, PTCH1, PTEN, RAD51C, RAD51D, RB1, RECQL, RET, SDHA, SDHAF2, SDHB, SDHC, SDHD, SMAD4, SMARCA4, SMARCB1, SMARCE1, STK11, SUFU, TMEM127,  TP53, TSC1, TSC2, VHL and XRCC2 (sequencing and deletion/duplication); EGFR, EGLN1, HOXB13, KIT, MITF, PDGFRA, POLD1, and POLE (sequencing only); EPCAM and GREM1 (deletion/duplication only).   The test report has been scanned into EPIC and is located under the Molecular Pathology section of the Results Review tab.  A portion of the result report is included below for reference.     We discussed with Terrance Powell that because current genetic testing is not perfect, it is possible there may be a gene mutation in one of these genes that current testing cannot detect, but that chance is small.  We also discussed, that there could be another gene that has not yet been discovered, or that we have not yet tested, that is responsible for the cancer diagnoses in the family. It is also possible there is a hereditary cause for the cancer in the family that Terrance Powell did not inherit and therefore was not identified in his testing.  Therefore, it is important to remain in touch with cancer genetics in the future so that we can continue to offer Terrance Powell the most up to date genetic testing.   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  At this time, it is unknown if this variant is associated with increased cancer risk or if this is a normal finding, but most variants such as this get reclassified to being inconsequential. It should not be used to make medical management decisions. With time, we suspect the lab will determine the significance of this variant, if any. If we do learn more about it, we will try to contact Terrance Powell to discuss it further. However, it is important to stay in touch with [place] periodically and keep the address and phone number up to date.  ADDITIONAL GENETIC TESTING: We discussed with Terrance Powell that his genetic testing was fairly extensive.  If there are genes identified to increase cancer risk that can be analyzed in the  future, we would be happy to discuss and coordinate this testing at that time.    CANCER SCREENING RECOMMENDATIONS: Terrance Powell test result is considered negative (normal).  This means that we have not identified a hereditary cause for his personal history of cancer at this time.   Given Terrance Powell personal histories, we must interpret these negative results with some caution.  Families with features suggestive of hereditary risk for cancer tend to have multiple family members with cancer, diagnoses in multiple generations and diagnoses before the age of 57. Terrance Powell family exhibits some of these features. Thus, this result may simply reflect our current inability to detect all mutations within these genes or there may be a different gene that has not yet been discovered or tested.   An individual's cancer risk and medical management are not determined by genetic test results alone. Overall cancer risk assessment incorporates additional factors, including personal medical history, family history, and any available genetic information that may result in a personalized plan for cancer prevention and surveillance.  RECOMMENDATIONS FOR FAMILY MEMBERS:  Individuals in this family might be at some increased risk of developing cancer, over the general population risk, simply due to the family history of cancer.  We recommended women in this family have a yearly mammogram beginning at age 28, or 68 years younger than the earliest onset of cancer, an annual clinical breast exam, and perform monthly breast self-exams. Women in this family should also have a gynecological exam as recommended by their primary provider.  First degree relatives of those with colon cancer should receive colonoscopies beginning at age 23, or 10 years prior to the earliest diagnosis of colon cancer in the family, and receive colonoscopies at least every 5 years, or as recommended by their gastroenterologist.    FOLLOW-UP: Lastly, we  discussed with Terrance Powell that cancer genetics is a rapidly advancing field and it is possible that new genetic tests will be appropriate for him and/or his family members in the future. We encouraged him to remain in contact with cancer genetics on an annual basis so we can update his personal and family histories and let him know of advances in cancer genetics that may benefit this family.   Our contact number was provided. Terrance Powell questions were answered to his satisfaction, and he knows he is welcome to call us at anytime with additional questions or concerns.     Terrance Powell M. Joette Catching, [place], [facility] Genetic Counselor [email or web address] (P) [phone]

## 2021-03-04 NOTE — Progress Notes (Signed)
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  He began adjuvant trastuzumab on 01/22/2021.  He had minimal discomfort with his first dose and not at all with the second dose.  Today is cycle 3.  His most recent echocardiogram from 12/26/2020 showed an ejection fraction of 55-60%.  His next echocardiogram will be due late November  Since his last visit, he underwent genetic counseling and testing on 02/02/2021. Results were negative, with the exception of a variant of uncertain significance in ATM.  He is due to begin tamoxifen today.   REVIEW OF SYSTEMS: Terrance Powell has a normal functional status for his age, is very intellectually alert, and today I discussed with me the likely end of the human race when the atmosphere changes and we can no longer breathe.  He expects this to happen sometime within the next 20,000 years.  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  A detailed review of systems today was otherwise stable.   COVID 19 VACCINATION STATUS: Received Moderna x2 and booster x2   HISTORY OF CURRENT ILLNESS: From the original intake note:  Terrance Powell presented with a palpable right breast mass directly behind his nipple, with associated nipple  crusting and ulceration/induration. He underwent bilateral diagnostic mammography with tomography and right breast ultrasonography at [facility] on 11/24/2020 showing: breast density category B; 3.4 cm retroareolar right breast mass, contiguous with skin of nipple; no abnormal right axillary lymph nodes; benign left breast gynecomastia, no evidence left breast malignancy.  Accordingly on 11/28/2020, he proceeded to biopsy of the right breast area in question. The pathology from this procedure ([id number]) showed: invasive mammary carcinoma, e-cadherin positive, grade 1-2. Prognostic indicators significant for: estrogen receptor, >95% positive with strong staining intensity and progesterone receptor, 0% negative. Proliferation marker Ki67 at 15%. HER2 equivocal by immunohistochemistry (2+), but positive by fluorescent in situ hybridization with a signals ratio 2.36 and number per cell 5.21.  The patient's subsequent history is as detailed below.   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    PAST SURGICAL HISTORY: Past Surgical History:  Procedure Laterality Date   COLECTOMY     LAP ASSISTED ASCENDING COLECTOMY/notes 09/08/2016   COLONOSCOPY W/ BIOPSIES     "last one was 08/09/2016" (09/08/2016)   LAPAROSCOPIC RIGHT COLECTOMY N/A 09/08/2016   Procedure: LAP ASSISTED ASCENDING COLECTOMY;  Surgeon: [name], MD;  Location: [facility];  Service: General;  Laterality: N/A;   MASTECTOMY W/ SENTINEL NODE BIOPSY  Right 12/29/2020   Procedure: RIGHT MASTECTOMY WITH SENTINEL LYMPH NODE BIOPSY;  Surgeon: [name], MD;  Location: [facility];  Service: General;  Laterality:  Right;    FAMILY HISTORY: Family History  Problem Relation Age of Onset   Breast cancer Mother        dx 59s-70s   Colon cancer Father 71       metastatic   Breast cancer Maternal Aunt        x2 maternal aunts; dx 91s   Cancer Paternal Aunt        unknown type; d. 48s   Esophageal cancer Paternal Uncle        d. 83s   Cancer Paternal Uncle        unknown type; d. 56s   Ovarian cancer Paternal Grandmother        d. late 30s-early 48s   His father died at age 72 from metastatic colon cancer. He also reports ovarian cancer in his paternal grandmother.  The patient's father had 7 brothers and 2 sisters 1 brother had esophageal cancer and the other 1 had cancer of a type unknown to the patient.--On the maternal side the patient's mother died at the age of 67 with breast cancer.  She had 4 sisters some of whom had cancer but he is not sure what type.  His mother had 3 brothers none of whom had cancer.  He is also not aware of any cancer in any cousins on either side.  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 His wife [name] worked as a Primary school teacher for a time.  Their daughter Terrance Powell lives in [place] where she works for the [employer].  Their daughter Terrance Powell lives in [place] and works as a Dietitian.  The patient has 4 grandchildren.  He is not a church attender    ADVANCED DIRECTIVES: The patient's wife is his healthcare power of attorney   HEALTH MAINTENANCE: Social History   Tobacco Use   Smoking status: Former   Smokeless tobacco: Never   Tobacco comments:    "toyed with " none in over 40 years"  Vaping Use   Vaping Use: Never used  Substance Use Topics   Alcohol use: Yes    Comment: 09/08/2016 "might have a couple drinks on holidays"   Drug use: No     Colonoscopy: 2018,  adenocarcinoma  PSA:   Bone density:    Allergies  Allergen Reactions   Penicillins Diarrhea, Itching and Other (See Comments)    TESTICULAR PAIN  Has patient had a PCN reaction causing immediate rash, facial/tongue/throat swelling, SOB or lightheadedness with hypotension: No SEVERE RASH INVOLVING MUCUS MEMBRANES or SKIN NECROSIS: #  #  #  YES  #  #  #  Has patient had a PCN reaction that required hospitalization No Has patient had a PCN reaction occurring within the last 10 years: No.     Current Outpatient Medications  Medication Sig Dispense Refill   tamoxifen (NOLVADEX) 20 MG tablet Take 1 tablet (20 mg total) by mouth daily. 90 tablet 4   bumetanide (BUMEX) 0.5 MG tablet Take 0.5 mg by mouth daily.     lisinopril (PRINIVIL,ZESTRIL) 10 MG tablet Take 20 mg by mouth daily. Takes 2 tablets     traMADol (ULTRAM) 50 MG  tablet Take 1 tablet (50 mg total) by mouth every 6 (six) hours as needed. 20 tablet 0   No current facility-administered medications for this visit.    OBJECTIVE: African-American man who appears younger than stated age  6:   03/05/21 1243  BP: (!) 155/66  Pulse: 73  Resp: 18  Temp: (!) 97.5 F (36.4 C)  SpO2: 100%      Body mass index is 31.87 kg/m.   Wt Readings from Last 3 Encounters:  03/05/21 222 lb 1.6 oz (100.7 kg)  01/22/21 219 lb 4.8 oz (99.5 kg)  12/29/20 226 lb 10.1 oz (102.8 kg)     ECOG FS:1 - Symptomatic but completely ambulatory  Sclerae unicteric, EOMs intact Wearing a mask No cervical or supraclavicular adenopathy Lungs no rales or rhonchi Heart regular rate and rhythm Abd soft, nontender, positive bowel sounds MSK no focal spinal tenderness, no upper extremity lymphedema Neuro: nonfocal, well oriented, appropriate affect Breasts: The right breast is status post mastectomy.  There is no evidence of local recurrence.  The left breast is benign.  Both axillae are benign   LAB RESULTS:  CMP     Component Value Date/Time    NA 142 02/12/2021 0743   K 3.7 02/12/2021 0743   CL 108 02/12/2021 0743   CO2 23 02/12/2021 0743   GLUCOSE 95 02/12/2021 0743   BUN 17 02/12/2021 0743   CREATININE 1.38 (H) 02/12/2021 0743   CREATININE 1.45 (H) 12/15/2020 1544   CALCIUM 9.3 02/12/2021 0743   PROT 7.3 02/12/2021 0743   ALBUMIN 3.9 02/12/2021 0743   AST 13 (L) 02/12/2021 0743   AST 15 12/15/2020 1544   ALT 12 02/12/2021 0743   ALT 14 12/15/2020 1544   ALKPHOS 86 02/12/2021 0743   BILITOT 1.1 02/12/2021 0743   BILITOT 0.8 12/15/2020 1544   GFRNONAA 50 (L) 02/12/2021 0743   GFRNONAA 48 (L) 12/15/2020 1544   GFRAA 53 (L) 09/09/2016 0513    No results found for: TOTALPROTELP, ALBUMINELP, A1GS, A2GS, BETS, BETA2SER, GAMS, MSPIKE, SPEI  Lab Results  Component Value Date   WBC 9.4 03/05/2021   NEUTROABS 6.3 03/05/2021   HGB 14.0 03/05/2021   HCT 40.0 03/05/2021   MCV 89.1 03/05/2021   PLT 306 03/05/2021    No results found for: LABCA2  No components found for: ZOXWRU045  No results for input(s): INR in the last 168 hours.  No results found for: LABCA2  No results found for: WUJ811  No results found for: BJY782  No results found for: NFA213  Lab Results  Component Value Date   CA2729 26.2 12/15/2020    No components found for: HGQUANT  No results found for: CEA1 / No results found for: CEA1   No results found for: AFPTUMOR  No results found for: CHROMOGRNA  No results found for: KPAFRELGTCHN, LAMBDASER, KAPLAMBRATIO (kappa/lambda light chains)  No results found for: HGBA, HGBA2QUANT, HGBFQUANT, HGBSQUAN (Hemoglobinopathy evaluation)   No results found for: LDH  No results found for: IRON, TIBC, IRONPCTSAT (Iron and TIBC)  No results found for: FERRITIN  Urinalysis No results found for: COLORURINE, APPEARANCEUR, LABSPEC, PHURINE, GLUCOSEU, HGBUR, BILIRUBINUR, KETONESUR, PROTEINUR, UROBILINOGEN, NITRITE, LEUKOCYTESUR   STUDIES: No results found.   ELIGIBLE FOR AVAILABLE  RESEARCH PROTOCOL: no  ASSESSMENT: 86 y.o. [place] man status post right central breast (retroareolar) biopsy 11/28/2020 for a clinical T2-T4, N0, stage IIA-IIIB invasive ductal carcinoma, estrogen receptor positive, progesterone receptor negative, HER2 amplified, with an MIB-1 of 15%  (  1) genetics testing 02/13/2021 through the [facility] Panel found no deleterious mutations in AIP, ALK, APC, ATM, AXIN2, BAP1, BARD1, BLM, BMPR1A, BRCA1, BRCA2, BRIP1, CDC73, CDH1, CDK4, CDKN1B, CDKN2A, CHEK2, CTNNA1, DICER1, FANCC, FH, FLCN, GALNT12, KIF1B, LZTR1, MAX, MEN1, MET, MLH1, MSH2, MSH3, MSH6, MUTYH, NBN, NF1, NF2, NTHL1, PALB2, PHOX2B, PMS2, POT1, PRKAR1A, PTCH1, PTEN, RAD51C, RAD51D, RB1, RECQL, RET, SDHA, SDHAF2, SDHB, SDHC, SDHD, SMAD4, SMARCA4, SMARCB1, SMARCE1, STK11, SUFU, TMEM127, TP53, TSC1, TSC2, VHL and XRCC2 (sequencing and deletion/duplication); EGFR, EGLN1, HOXB13, KIT, MITF, PDGFRA, POLD1, and POLE (sequencing only); EPCAM and GREM1 (deletion/duplication only).   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    (2) status post right mastectomy 12/30/2020 for a pT2, pN0, stage IIA invasive ductal carcinoma, grade 2, with negative margins  (A) a total of one SLN was biopsied and negative  (3) no adjuvant radiation indicated  (4) anti-HER2 treatment; trastuzumab started 01/22/2021  (A) echo on 12/26/2020 shows EF of 55-60%  (B) echo November 2022  (5) tamoxifen started 03/09/2021   PLAN: Jaymian is tolerating Herceptin without any unusual side effects.  He will receive his third dose today.  He understands we will continue this until he completes a year of therapy.  He will need echocardiography every 3 months and his next one has been ordered for November 2022.  Today we discussed starting tamoxifen.  He is aware of the possible toxicities side effects and complications of this agent.  I particularly asked him to contact us if  he develops ankle or leg swelling on 1 side but not the other side or if he develops sudden shortness of breath  I have encouraged him to remain as active as possible.  He knows to call for any other issue that may develop before the next visit  Total encounter time 20 minutes.Sarajane Jews C. Rachel Samples, MD 03/05/21 12:59 PM Medical Oncology and Hematology [facility] [place], [place] [address] Tel. [phone]    Fax. [phone]   I, Wilburn Mylar, am acting as scribe for Dr. Virgie Dad. Lekendrick Alpern.  I, Lurline Del MD, have reviewed the above documentation for accuracy and completeness, and I agree with the above.    *Total Encounter Time as defined by the Centers for Medicare and Medicaid Services includes, in addition to the face-to-face time of a patient visit (documented in the note above) non-face-to-face time: obtaining and reviewing outside history, ordering and reviewing medications, tests or procedures, care coordination (communications with other health care professionals or caregivers) and documentation in the medical record.

## 2021-03-05 ENCOUNTER — Inpatient Hospital Stay: Payer: Medicare Other

## 2021-03-05 ENCOUNTER — Inpatient Hospital Stay: Payer: Medicare Other | Attending: Oncology

## 2021-03-05 ENCOUNTER — Other Ambulatory Visit: Payer: Self-pay

## 2021-03-05 ENCOUNTER — Inpatient Hospital Stay (HOSPITAL_BASED_OUTPATIENT_CLINIC_OR_DEPARTMENT_OTHER): Payer: Medicare Other | Admitting: Oncology

## 2021-03-05 VITALS — BP 155/66 | HR 73 | Temp 97.5°F | Resp 18 | Ht 70.0 in | Wt 222.1 lb

## 2021-03-05 DIAGNOSIS — Z170 Estrogen receptor positive status [ER+]: Secondary | ICD-10-CM | POA: Insufficient documentation

## 2021-03-05 DIAGNOSIS — Z803 Family history of malignant neoplasm of breast: Secondary | ICD-10-CM | POA: Diagnosis not present

## 2021-03-05 DIAGNOSIS — Z800 Family history of malignant neoplasm of digestive organs: Secondary | ICD-10-CM | POA: Diagnosis not present

## 2021-03-05 DIAGNOSIS — Z8041 Family history of malignant neoplasm of ovary: Secondary | ICD-10-CM | POA: Diagnosis not present

## 2021-03-05 DIAGNOSIS — C50121 Malignant neoplasm of central portion of right male breast: Secondary | ICD-10-CM | POA: Insufficient documentation

## 2021-03-05 DIAGNOSIS — K5900 Constipation, unspecified: Secondary | ICD-10-CM | POA: Diagnosis not present

## 2021-03-05 DIAGNOSIS — Z79899 Other long term (current) drug therapy: Secondary | ICD-10-CM | POA: Insufficient documentation

## 2021-03-05 DIAGNOSIS — I10 Essential (primary) hypertension: Secondary | ICD-10-CM | POA: Insufficient documentation

## 2021-03-05 DIAGNOSIS — Z79810 Long term (current) use of selective estrogen receptor modulators (SERMs): Secondary | ICD-10-CM | POA: Diagnosis not present

## 2021-03-05 DIAGNOSIS — Z5112 Encounter for antineoplastic immunotherapy: Secondary | ICD-10-CM | POA: Diagnosis not present

## 2021-03-05 DIAGNOSIS — C184 Malignant neoplasm of transverse colon: Secondary | ICD-10-CM

## 2021-03-05 LAB — CBC WITH DIFFERENTIAL/PLATELET
Abs Immature Granulocytes: 0.02 10*3/uL (ref 0.00–0.07)
Basophils Absolute: 0.1 10*3/uL (ref 0.0–0.1)
Basophils Relative: 1 %
Eosinophils Absolute: 0.1 10*3/uL (ref 0.0–0.5)
Eosinophils Relative: 1 %
HCT: 40 % (ref 39.0–52.0)
Hemoglobin: 14 g/dL (ref 13.0–17.0)
Immature Granulocytes: 0 %
Lymphocytes Relative: 25 %
Lymphs Abs: 2.3 10*3/uL (ref 0.7–4.0)
MCH: 31.2 pg (ref 26.0–34.0)
MCHC: 35 g/dL (ref 30.0–36.0)
MCV: 89.1 fL (ref 80.0–100.0)
Monocytes Absolute: 0.5 10*3/uL (ref 0.1–1.0)
Monocytes Relative: 6 %
Neutro Abs: 6.3 10*3/uL (ref 1.7–7.7)
Neutrophils Relative %: 67 %
Platelets: 306 10*3/uL (ref 150–400)
RBC: 4.49 MIL/uL (ref 4.22–5.81)
RDW: 13.1 % (ref 11.5–15.5)
WBC: 9.4 10*3/uL (ref 4.0–10.5)
nRBC: 0 % (ref 0.0–0.2)

## 2021-03-05 LAB — COMPREHENSIVE METABOLIC PANEL
ALT: 13 U/L (ref 0–44)
AST: 19 U/L (ref 15–41)
Albumin: 3.8 g/dL (ref 3.5–5.0)
Alkaline Phosphatase: 80 U/L (ref 38–126)
Anion gap: 11 (ref 5–15)
BUN: 16 mg/dL (ref 8–23)
CO2: 23 mmol/L (ref 22–32)
Calcium: 9.3 mg/dL (ref 8.9–10.3)
Chloride: 107 mmol/L (ref 98–111)
Creatinine, Ser: 1.33 mg/dL — ABNORMAL HIGH (ref 0.61–1.24)
GFR, Estimated: 53 mL/min — ABNORMAL LOW (ref >60)
Glucose, Bld: 100 mg/dL — ABNORMAL HIGH (ref 70–99)
Potassium: 3.9 mmol/L (ref 3.5–5.1)
Sodium: 141 mmol/L (ref 135–145)
Total Bilirubin: 0.7 mg/dL (ref 0.3–1.2)
Total Protein: 7.3 g/dL (ref 6.5–8.1)

## 2021-03-05 MED ORDER — SODIUM CHLORIDE 0.9 % IV SOLN: Freq: Once | INTRAVENOUS | Status: AC

## 2021-03-05 MED ORDER — ACETAMINOPHEN 325 MG PO TABS
650.0000 mg | ORAL_TABLET | Freq: Once | ORAL | Status: AC
  Administered 2021-03-05: 650 mg via ORAL
  Filled 2021-03-05: qty 2

## 2021-03-05 MED ORDER — TRASTUZUMAB-ANNS CHEMO 150 MG IV SOLR
6.0000 mg/kg | Freq: Once | INTRAVENOUS | Status: AC
  Administered 2021-03-05: 609 mg via INTRAVENOUS
  Filled 2021-03-05: qty 29

## 2021-03-05 MED ORDER — DIPHENHYDRAMINE HCL 25 MG PO CAPS
50.0000 mg | ORAL_CAPSULE | Freq: Once | ORAL | Status: AC
  Administered 2021-03-05: 50 mg via ORAL
  Filled 2021-03-05: qty 2

## 2021-03-05 MED ORDER — TAMOXIFEN CITRATE 20 MG PO TABS: 20.0000 mg | ORAL_TABLET | Freq: Every day | ORAL | 4 refills | Status: AC

## 2021-03-05 NOTE — Patient Instructions (Signed)
[facility]ICAL [facility]COLOG[facility]  Discharge Instructions: Thank you for choosing [facility] to provide your oncology and hematology care.   If you have a lab appointment with the Cancer Center, please go directly to the Cancer Center and check in at the registration area.   Wear comfortable clothing and clothing appropriate for easy access to any Portacath or PICC line.   We strive to give you quality time with your provider. You may need to reschedule your appointment if you arrive late (15 or more minutes).  Arriving late affects you and other patients whose appointments are after yours.  Also, if you miss three or more appointments without notifying the office, you may be dismissed from the clinic at the provider's discretion.      For prescription refill requests, have your pharmacy contact our office and allow 72 hours for refills to be completed.    Today you received the following chemotherapy and/or immunotherapy agents trastuzumab      To help prevent nausea and vomiting after your treatment, we encourage you to take your nausea medication as directed.  BELOW ARE SYMPTOMS THAT SHOULD BE REPORTED IMMEDIATELY: *FEVER GREATER THAN 100.4 F (38 C) OR HIGHER *CHILLS OR SWEATING *NAUSEA AND VOMITING THAT IS NOT CONTROLLED WITH YOUR NAUSEA MEDICATION *UNUSUAL SHORTNESS OF BREATH *UNUSUAL BRUISING OR BLEEDING *URINARY PROBLEMS (pain or burning when urinating, or frequent urination) *BOWEL PROBLEMS (unusual diarrhea, constipation, pain near the anus) TENDERNESS IN MOUTH AND THROAT WITH OR WITHOUT PRESENCE OF ULCERS (sore throat, sores in mouth, or a toothache) UNUSUAL RASH, SWELLING OR PAIN  UNUSUAL VAGINAL DISCHARGE OR ITCHING   Items with * indicate a potential emergency and should be followed up as soon as possible or go to the Emergency Department if any problems should occur.  Please show the CHEMOTHERAPY ALERT CARD or IMMUNOTHERAPY ALERT CARD at check-in to  the Emergency Department and triage nurse.  Should you have questions after your visit or need to cancel or reschedule your appointment, please contact [facility]  Dept: [phone]  and follow the prompts.  Office hours are 8:00 a.m. to 4:30 p.m. Monday - Friday. Please note that voicemails left after 4:00 p.m. may not be returned until the following business day.  We are closed weekends and major holidays. You have access to a nurse at all times for urgent questions. Please call the main number to the clinic Dept: [phone] and follow the prompts.   For any non-urgent questions, you may also contact your provider using MyChart. We now offer e-Visits for anyone 18 and older to request care online for non-urgent symptoms. For details visit [email or web address].   Also download the MyChart app! Go to the app store, search "MyChart", open the app, select [facility], and log in with your MyChart username and password.  Due to Covid, a mask is required upon entering the hospital/clinic. If you do not have a mask, one will be given to you upon arrival. For doctor visits, patients may have 1 support person aged 18 or older with them. For treatment visits, patients cannot have anyone with them due to current Covid guidelines and our immunocompromised population.   

## 2021-03-26 ENCOUNTER — Other Ambulatory Visit: Payer: Self-pay

## 2021-03-26 ENCOUNTER — Inpatient Hospital Stay: Payer: Medicare Other

## 2021-03-26 ENCOUNTER — Inpatient Hospital Stay: Payer: Medicare Other | Attending: Oncology

## 2021-03-26 VITALS — BP 131/82 | HR 62 | Temp 98.0°F | Resp 18 | Wt 217.0 lb

## 2021-03-26 DIAGNOSIS — Z170 Estrogen receptor positive status [ER+]: Secondary | ICD-10-CM

## 2021-03-26 DIAGNOSIS — C50121 Malignant neoplasm of central portion of right male breast: Secondary | ICD-10-CM | POA: Insufficient documentation

## 2021-03-26 DIAGNOSIS — Z5112 Encounter for antineoplastic immunotherapy: Secondary | ICD-10-CM | POA: Insufficient documentation

## 2021-03-26 LAB — COMPREHENSIVE METABOLIC PANEL
ALT: 12 U/L (ref 0–44)
AST: 14 U/L — ABNORMAL LOW (ref 15–41)
Albumin: 3.9 g/dL (ref 3.5–5.0)
Alkaline Phosphatase: 71 U/L (ref 38–126)
Anion gap: 11 (ref 5–15)
BUN: 22 mg/dL (ref 8–23)
CO2: 23 mmol/L (ref 22–32)
Calcium: 8.8 mg/dL — ABNORMAL LOW (ref 8.9–10.3)
Chloride: 108 mmol/L (ref 98–111)
Creatinine, Ser: 1.38 mg/dL — ABNORMAL HIGH (ref 0.61–1.24)
GFR, Estimated: 50 mL/min — ABNORMAL LOW (ref >60)
Glucose, Bld: 95 mg/dL (ref 70–99)
Potassium: 3.9 mmol/L (ref 3.5–5.1)
Sodium: 142 mmol/L (ref 135–145)
Total Bilirubin: 0.6 mg/dL (ref 0.3–1.2)
Total Protein: 7.2 g/dL (ref 6.5–8.1)

## 2021-03-26 LAB — CBC WITH DIFFERENTIAL/PLATELET
Abs Immature Granulocytes: 0.02 10*3/uL (ref 0.00–0.07)
Basophils Absolute: 0.1 10*3/uL (ref 0.0–0.1)
Basophils Relative: 1 %
Eosinophils Absolute: 0.3 10*3/uL (ref 0.0–0.5)
Eosinophils Relative: 4 %
HCT: 40 % (ref 39.0–52.0)
Hemoglobin: 13.8 g/dL (ref 13.0–17.0)
Immature Granulocytes: 0 %
Lymphocytes Relative: 28 %
Lymphs Abs: 2.3 10*3/uL (ref 0.7–4.0)
MCH: 30.8 pg (ref 26.0–34.0)
MCHC: 34.5 g/dL (ref 30.0–36.0)
MCV: 89.3 fL (ref 80.0–100.0)
Monocytes Absolute: 0.5 10*3/uL (ref 0.1–1.0)
Monocytes Relative: 7 %
Neutro Abs: 4.8 10*3/uL (ref 1.7–7.7)
Neutrophils Relative %: 60 %
Platelets: 278 10*3/uL (ref 150–400)
RBC: 4.48 MIL/uL (ref 4.22–5.81)
RDW: 12.5 % (ref 11.5–15.5)
WBC: 8 10*3/uL (ref 4.0–10.5)
nRBC: 0 % (ref 0.0–0.2)

## 2021-03-26 MED ORDER — DIPHENHYDRAMINE HCL 25 MG PO CAPS
50.0000 mg | ORAL_CAPSULE | Freq: Once | ORAL | Status: AC
  Administered 2021-03-26: 50 mg via ORAL
  Filled 2021-03-26: qty 2

## 2021-03-26 MED ORDER — TRASTUZUMAB-ANNS CHEMO 150 MG IV SOLR
6.0000 mg/kg | Freq: Once | INTRAVENOUS | Status: AC
  Administered 2021-03-26: 609 mg via INTRAVENOUS
  Filled 2021-03-26: qty 29

## 2021-03-26 MED ORDER — SODIUM CHLORIDE 0.9 % IV SOLN: Freq: Once | INTRAVENOUS | Status: AC

## 2021-03-26 MED ORDER — ACETAMINOPHEN 325 MG PO TABS
650.0000 mg | ORAL_TABLET | Freq: Once | ORAL | Status: AC
  Administered 2021-03-26: 650 mg via ORAL
  Filled 2021-03-26: qty 2

## 2021-03-26 NOTE — Patient Instructions (Signed)
[facility]COL[facility]  Discharge Instructions: Thank you for choosing [facility] to provide your oncology and hematology care.   If you have a lab appointment with the Cancer Center, please go directly to the Cancer Center and check in at the registration area.   Wear comfortable clothing and clothing appropriate for easy access to any Portacath or PICC line.   We strive to give you quality time with your provider. You may need to reschedule your appointment if you arrive late (15 or more minutes).  Arriving late affects you and other patients whose appointments are after yours.  Also, if you miss three or more appointments without notifying the office, you may be dismissed from the clinic at the provider's discretion.      For prescription refill requests, have your pharmacy contact our office and allow 72 hours for refills to be completed.    Today you received the following chemotherapy and/or immunotherapy agents : Herceptin     To help prevent nausea and vomiting after your treatment, we encourage you to take your nausea medication as directed.  BELOW ARE SYMPTOMS THAT SHOULD BE REPORTED IMMEDIATELY: *FEVER GREATER THAN 100.4 F (38 C) OR HIGHER *CHILLS OR SWEATING *NAUSEA AND VOMITING THAT IS NOT CONTROLLED WITH YOUR NAUSEA MEDICATION *UNUSUAL SHORTNESS OF BREATH *UNUSUAL BRUISING OR BLEEDING *URINARY PROBLEMS (pain or burning when urinating, or frequent urination) *BOWEL PROBLEMS (unusual diarrhea, constipation, pain near the anus) TENDERNESS IN MOUTH AND THROAT WITH OR WITHOUT PRESENCE OF ULCERS (sore throat, sores in mouth, or a toothache) UNUSUAL RASH, SWELLING OR PAIN  UNUSUAL VAGINAL DISCHARGE OR ITCHING   Items with * indicate a potential emergency and should be followed up as soon as possible or go to the Emergency Department if any problems should occur.  Please show the CHEMOTHERAPY ALERT CARD or IMMUNOTHERAPY ALERT CARD at check-in to  the Emergency Department and triage nurse.  Should you have questions after your visit or need to cancel or reschedule your appointment, please contact [facility]  Dept: [phone]  and follow the prompts.  Office hours are 8:00 a.m. to 4:30 p.m. Monday - Friday. Please note that voicemails left after 4:00 p.m. may not be returned until the following business day.  We are closed weekends and major holidays. You have access to a nurse at all times for urgent questions. Please call the main number to the clinic Dept: [phone] and follow the prompts.   For any non-urgent questions, you may also contact your provider using MyChart. We now offer e-Visits for anyone 18 and older to request care online for non-urgent symptoms. For details visit [email or web address].   Also download the MyChart app! Go to the app store, search "MyChart", open the app, select [facility], and log in with your MyChart username and password.  Due to Covid, a mask is required upon entering the hospital/clinic. If you do not have a mask, one will be given to you upon arrival. For doctor visits, patients may have 1 support person aged 18 or older with them. For treatment visits, patients cannot have anyone with them due to current Covid guidelines and our immunocompromised population.   

## 2021-03-27 ENCOUNTER — Other Ambulatory Visit (HOSPITAL_COMMUNITY): Payer: Medicare Other

## 2021-04-07 ENCOUNTER — Other Ambulatory Visit: Payer: Self-pay

## 2021-04-07 ENCOUNTER — Ambulatory Visit (HOSPITAL_COMMUNITY)
Admission: RE | Admit: 2021-04-07 | Discharge: 2021-04-07 | Disposition: A | Discharge status: Home / Self Care | Payer: Medicare Other | Source: Ambulatory Visit | Attending: Oncology | Admitting: Oncology

## 2021-04-07 DIAGNOSIS — Z170 Estrogen receptor positive status [ER+]: Secondary | ICD-10-CM | POA: Diagnosis not present

## 2021-04-07 DIAGNOSIS — C50121 Malignant neoplasm of central portion of right male breast: Secondary | ICD-10-CM | POA: Diagnosis not present

## 2021-04-07 DIAGNOSIS — I10 Essential (primary) hypertension: Secondary | ICD-10-CM | POA: Diagnosis not present

## 2021-04-07 DIAGNOSIS — C184 Malignant neoplasm of transverse colon: Secondary | ICD-10-CM | POA: Diagnosis not present

## 2021-04-07 DIAGNOSIS — Z0189 Encounter for other specified special examinations: Secondary | ICD-10-CM | POA: Diagnosis not present

## 2021-04-07 LAB — ECHOCARDIOGRAM COMPLETE
AR max vel: 2.68 cm2
AV Area VTI: 2.7 cm2
AV Area mean vel: 2.55 cm2
AV Mean grad: 4 mmHg
AV Peak grad: 6.7 mmHg
Ao pk vel: 1.29 m/s
Area-P 1/2: 2.99 cm2
Calc EF: 54.8 %
S' Lateral: 3.5 cm
Single Plane A2C EF: 57.7 %
Single Plane A4C EF: 50.9 %

## 2021-04-07 NOTE — Progress Notes (Signed)
  Echocardiogram 2D Echocardiogram has been performed.  Terrance Powell 04/07/2021, 10:13 AM

## 2021-04-09 ENCOUNTER — Other Ambulatory Visit (HOSPITAL_COMMUNITY): Payer: Medicare Other

## 2021-04-16 ENCOUNTER — Encounter: Payer: Self-pay | Admitting: Adult Health

## 2021-04-16 ENCOUNTER — Other Ambulatory Visit: Payer: Self-pay

## 2021-04-16 ENCOUNTER — Inpatient Hospital Stay (HOSPITAL_BASED_OUTPATIENT_CLINIC_OR_DEPARTMENT_OTHER): Payer: Medicare Other | Admitting: Adult Health

## 2021-04-16 ENCOUNTER — Inpatient Hospital Stay: Payer: Medicare Other | Attending: Oncology

## 2021-04-16 ENCOUNTER — Inpatient Hospital Stay: Payer: Medicare Other

## 2021-04-16 ENCOUNTER — Encounter: Payer: Self-pay | Admitting: *Deleted

## 2021-04-16 ENCOUNTER — Encounter: Payer: Self-pay | Admitting: Oncology

## 2021-04-16 VITALS — BP 168/56 | HR 61 | Temp 97.7°F | Resp 18 | Ht 70.0 in | Wt 221.7 lb

## 2021-04-16 DIAGNOSIS — Z170 Estrogen receptor positive status [ER+]: Secondary | ICD-10-CM

## 2021-04-16 DIAGNOSIS — N183 Chronic kidney disease, stage 3 unspecified: Secondary | ICD-10-CM | POA: Insufficient documentation

## 2021-04-16 DIAGNOSIS — Z5112 Encounter for antineoplastic immunotherapy: Secondary | ICD-10-CM | POA: Insufficient documentation

## 2021-04-16 DIAGNOSIS — Z803 Family history of malignant neoplasm of breast: Secondary | ICD-10-CM | POA: Insufficient documentation

## 2021-04-16 DIAGNOSIS — C50121 Malignant neoplasm of central portion of right male breast: Secondary | ICD-10-CM | POA: Diagnosis not present

## 2021-04-16 DIAGNOSIS — Z8041 Family history of malignant neoplasm of ovary: Secondary | ICD-10-CM | POA: Insufficient documentation

## 2021-04-16 DIAGNOSIS — I10 Essential (primary) hypertension: Secondary | ICD-10-CM | POA: Insufficient documentation

## 2021-04-16 DIAGNOSIS — Z800 Family history of malignant neoplasm of digestive organs: Secondary | ICD-10-CM | POA: Insufficient documentation

## 2021-04-16 DIAGNOSIS — I499 Cardiac arrhythmia, unspecified: Secondary | ICD-10-CM | POA: Insufficient documentation

## 2021-04-16 DIAGNOSIS — M109 Gout, unspecified: Secondary | ICD-10-CM | POA: Insufficient documentation

## 2021-04-16 DIAGNOSIS — Z79810 Long term (current) use of selective estrogen receptor modulators (SERMs): Secondary | ICD-10-CM | POA: Diagnosis not present

## 2021-04-16 LAB — CMP (CANCER CENTER ONLY)
ALT: 17 U/L (ref 0–44)
AST: 16 U/L (ref 15–41)
Albumin: 3.6 g/dL (ref 3.5–5.0)
Alkaline Phosphatase: 61 U/L (ref 38–126)
Anion gap: 10 (ref 5–15)
BUN: 18 mg/dL (ref 8–23)
CO2: 22 mmol/L (ref 22–32)
Calcium: 8.7 mg/dL — ABNORMAL LOW (ref 8.9–10.3)
Chloride: 111 mmol/L (ref 98–111)
Creatinine: 1.4 mg/dL — ABNORMAL HIGH (ref 0.61–1.24)
GFR, Estimated: 50 mL/min — ABNORMAL LOW (ref >60)
Glucose, Bld: 90 mg/dL (ref 70–99)
Potassium: 4.1 mmol/L (ref 3.5–5.1)
Sodium: 143 mmol/L (ref 135–145)
Total Bilirubin: 0.5 mg/dL (ref 0.3–1.2)
Total Protein: 6.8 g/dL (ref 6.5–8.1)

## 2021-04-16 LAB — CBC WITH DIFFERENTIAL/PLATELET
Abs Immature Granulocytes: 0.01 10*3/uL (ref 0.00–0.07)
Basophils Absolute: 0.1 10*3/uL (ref 0.0–0.1)
Basophils Relative: 1 %
Eosinophils Absolute: 0.3 10*3/uL (ref 0.0–0.5)
Eosinophils Relative: 4 %
HCT: 37.1 % — ABNORMAL LOW (ref 39.0–52.0)
Hemoglobin: 12.8 g/dL — ABNORMAL LOW (ref 13.0–17.0)
Immature Granulocytes: 0 %
Lymphocytes Relative: 30 %
Lymphs Abs: 2.2 10*3/uL (ref 0.7–4.0)
MCH: 31 pg (ref 26.0–34.0)
MCHC: 34.5 g/dL (ref 30.0–36.0)
MCV: 89.8 fL (ref 80.0–100.0)
Monocytes Absolute: 0.6 10*3/uL (ref 0.1–1.0)
Monocytes Relative: 9 %
Neutro Abs: 4.2 10*3/uL (ref 1.7–7.7)
Neutrophils Relative %: 56 %
Platelets: 227 10*3/uL (ref 150–400)
RBC: 4.13 MIL/uL — ABNORMAL LOW (ref 4.22–5.81)
RDW: 12.6 % (ref 11.5–15.5)
WBC: 7.4 10*3/uL (ref 4.0–10.5)
nRBC: 0 % (ref 0.0–0.2)

## 2021-04-16 MED ORDER — DIPHENHYDRAMINE HCL 25 MG PO CAPS
50.0000 mg | ORAL_CAPSULE | Freq: Once | ORAL | Status: AC
  Administered 2021-04-16: 50 mg via ORAL
  Filled 2021-04-16: qty 2

## 2021-04-16 MED ORDER — SODIUM CHLORIDE 0.9 % IV SOLN
Freq: Once | INTRAVENOUS | Status: AC
Start: 2021-04-16 — End: 2021-04-16

## 2021-04-16 MED ORDER — ACETAMINOPHEN 325 MG PO TABS
650.0000 mg | ORAL_TABLET | Freq: Once | ORAL | Status: AC
  Administered 2021-04-16: 650 mg via ORAL
  Filled 2021-04-16: qty 2

## 2021-04-16 MED ORDER — TRASTUZUMAB-ANNS CHEMO 150 MG IV SOLR
6.0000 mg/kg | Freq: Once | INTRAVENOUS | Status: AC
  Administered 2021-04-16: 609 mg via INTRAVENOUS
  Filled 2021-04-16: qty 29

## 2021-04-16 NOTE — Patient Instructions (Signed)
[facility]  Discharge Instructions: Thank you for choosing [facility] to provide your oncology and hematology care.   If you have a lab appointment with the Cancer Center, please go directly to the Cancer Center and check in at the registration area.   Wear comfortable clothing and clothing appropriate for easy access to any Portacath or PICC line.   We strive to give you quality time with your provider. You may need to reschedule your appointment if you arrive late (15 or more minutes).  Arriving late affects you and other patients whose appointments are after yours.  Also, if you miss three or more appointments without notifying the office, you may be dismissed from the clinic at the provider's discretion.      For prescription refill requests, have your pharmacy contact our office and allow 72 hours for refills to be completed.    Today you received the following chemotherapy and/or immunotherapy agents trastuzumab      To help prevent nausea and vomiting after your treatment, we encourage you to take your nausea medication as directed.  BELOW ARE SYMPTOMS THAT SHOULD BE REPORTED IMMEDIATELY: *FEVER GREATER THAN 100.4 F (38 C) OR HIGHER *CHILLS OR SWEATING *NAUSEA AND VOMITING THAT IS NOT CONTROLLED WITH YOUR NAUSEA MEDICATION *UNUSUAL SHORTNESS OF BREATH *UNUSUAL BRUISING OR BLEEDING *URINARY PROBLEMS (pain or burning when urinating, or frequent urination) *BOWEL PROBLEMS (unusual diarrhea, constipation, pain near the anus) TENDERNESS IN MOUTH AND THROAT WITH OR WITHOUT PRESENCE OF ULCERS (sore throat, sores in mouth, or a toothache) UNUSUAL RASH, SWELLING OR PAIN  UNUSUAL VAGINAL DISCHARGE OR ITCHING   Items with * indicate a potential emergency and should be followed up as soon as possible or go to the Emergency Department if any problems should occur.  Please show the CHEMOTHERAPY ALERT CARD or IMMUNOTHERAPY ALERT CARD at check-in to  the Emergency Department and triage nurse.  Should you have questions after your visit or need to cancel or reschedule your appointment, please contact [facility]  Dept: [phone]  and follow the prompts.  Office hours are 8:00 a.m. to 4:30 p.m. Monday - Friday. Please note that voicemails left after 4:00 p.m. may not be returned until the following business day.  We are closed weekends and major holidays. You have access to a nurse at all times for urgent questions. Please call the main number to the clinic Dept: [phone] and follow the prompts.   For any non-urgent questions, you may also contact your provider using MyChart. We now offer e-Visits for anyone 18 and older to request care online for non-urgent symptoms. For details visit [email or web address].   Also download the MyChart app! Go to the app store, search "MyChart", open the app, select [facility], and log in with your MyChart username and password.  Due to Covid, a mask is required upon entering the hospital/clinic. If you do not have a mask, one will be given to you upon arrival. For doctor visits, patients may have 1 support person aged 18 or older with them. For treatment visits, patients cannot have anyone with them due to current Covid guidelines and our immunocompromised population.   

## 2021-04-16 NOTE — Progress Notes (Signed)
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  He began adjuvant trastuzumab on 01/22/2021.  He had minimal discomfort with his first dose and not at all with the second dose.  Today is cycle 5.  He is tolerating Herceptin quite well.  His most recent echocardiogram was completed on April 07, 2021.  It showed a left ventricular ejection fraction of 50 to 55%.  With a normal global longitudinal strain.  6 weeks ago he began on tamoxifen daily.   REVIEW OF SYSTEMS: Review of Systems  Constitutional:  Negative for appetite change, chills, fatigue, fever and unexpected weight change.  HENT:   Negative for hearing loss, lump/mass and trouble swallowing.   Eyes:  Negative for eye problems and icterus.  Respiratory:  Negative for chest tightness, cough and shortness of breath.   Cardiovascular:  Negative for chest pain, leg swelling and palpitations.  Gastrointestinal:  Negative for abdominal distention, abdominal pain, constipation, diarrhea, nausea and vomiting.  Endocrine: Negative for hot flashes.  Genitourinary:  Negative for difficulty urinating.   Musculoskeletal:  Negative for arthralgias.  Skin:  Negative for itching and rash.  Neurological:  Negative for  dizziness, extremity weakness, headaches and numbness.  Hematological:  Negative for adenopathy. Does not bruise/bleed easily.  Psychiatric/Behavioral:  Negative for depression. The patient is not nervous/anxious.     COVID 19 VACCINATION STATUS: Received Moderna x2 and booster x2   HISTORY OF CURRENT ILLNESS: From the original intake note:  Terrance Powell presented with a palpable right breast mass directly behind his nipple, with associated nipple crusting and ulceration/induration. He underwent bilateral diagnostic mammography with tomography and right breast ultrasonography at [facility] on 11/24/2020 showing: breast density category B; 3.4 cm retroareolar right breast mass, contiguous with skin of nipple; no abnormal right axillary lymph nodes; benign left breast gynecomastia, no evidence left breast malignancy.  Accordingly on 11/28/2020, he proceeded to biopsy of the right breast area in question. The pathology from this procedure ([id number]) showed: invasive mammary carcinoma, e-cadherin positive, grade 1-2. Prognostic indicators significant for: estrogen receptor, >95% positive with strong staining intensity and progesterone receptor, 0% negative. Proliferation marker Ki67 at 15%. HER2 equivocal by immunohistochemistry (2+), but positive by fluorescent in situ hybridization with a signals ratio 2.36 and number per cell 5.21.  The patient's subsequent history is as detailed below.   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    PAST SURGICAL HISTORY: Past Surgical History:  Procedure Laterality Date   COLECTOMY  LAP ASSISTED ASCENDING COLECTOMY/notes 09/08/2016    COLONOSCOPY W/ BIOPSIES     "last one was 08/09/2016" (09/08/2016)   LAPAROSCOPIC RIGHT COLECTOMY N/A 09/08/2016   Procedure: LAP ASSISTED ASCENDING COLECTOMY;  Surgeon: [name], MD;  Location: [facility];  Service: General;  Laterality: N/A;   MASTECTOMY W/ SENTINEL NODE BIOPSY Right 12/29/2020   Procedure: RIGHT MASTECTOMY WITH SENTINEL LYMPH NODE BIOPSY;  Surgeon: [name], MD;  Location: [facility];  Service: General;  Laterality: Right;    FAMILY HISTORY: Family History  Problem Relation Age of Onset   Breast cancer Mother        dx 54s-70s   Colon cancer Father 73       metastatic   Breast cancer Maternal Aunt        x2 maternal aunts; dx 7s   Cancer Paternal Aunt        unknown type; d. 38s   Esophageal cancer Paternal Uncle        d. 66s   Cancer Paternal Uncle        unknown type; d. 57s   Ovarian cancer Paternal Grandmother        d. late 30s-early 57s   His father died at age 26 from metastatic colon cancer. He also reports ovarian cancer in his paternal grandmother.  The patient's father had 7 brothers and 2 sisters 1 brother had esophageal cancer and the other 1 had cancer of a type unknown to the patient.--On the maternal side the patient's mother died at the age of 24 with breast cancer.  She had 4 sisters some of whom had cancer but he is not sure what type.  His mother had 3 brothers none of whom had cancer.  He is also not aware of any cancer in any cousins on either side.  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 His wife [name] worked as a Primary school teacher for a time.  Their daughter [name] lives in [place] where she works for the [employer].  Their daughter [name] lives in [place] and works as a Dietitian.  The patient has 4 grandchildren.  He is not a church  attender    ADVANCED DIRECTIVES: The patient's wife is his healthcare power of attorney   HEALTH MAINTENANCE: Social History   Tobacco Use   Smoking status: Former   Smokeless tobacco: Never   Tobacco comments:    "toyed with " none in over 40 years"  Vaping Use   Vaping Use: Never used  Substance Use Topics   Alcohol use: Yes    Comment: 09/08/2016 "might have a couple drinks on holidays"   Drug use: No     Colonoscopy: 2018, adenocarcinoma  PSA:   Bone density:    Allergies  Allergen Reactions   Penicillins Diarrhea, Itching and Other (See Comments)    TESTICULAR PAIN  Has patient had a PCN reaction causing immediate rash, facial/tongue/throat swelling, SOB or lightheadedness with hypotension: No SEVERE RASH INVOLVING MUCUS MEMBRANES or SKIN NECROSIS: #  #  #  YES  #  #  #  Has patient had a PCN reaction that required hospitalization No Has patient had a PCN reaction occurring within the last 10 years: No.     Current Outpatient Medications  Medication Sig Dispense Refill   tamoxifen (  NOLVADEX) 20 MG tablet Take 20 mg by mouth daily.     bumetanide (BUMEX) 0.5 MG tablet Take 0.5 mg by mouth daily.     lisinopril (PRINIVIL,ZESTRIL) 10 MG tablet Take 20 mg by mouth daily. Takes 2 tablets     No current facility-administered medications for this visit.    OBJECTIVE: African-American man who appears younger than stated age  79:   04/16/21 0837  BP: (!) 168/56  Pulse: 61  Resp: 18  Temp: 97.7 F (36.5 C)  SpO2: 100%      Body mass index is 31.81 kg/m.   Wt Readings from Last 3 Encounters:  04/16/21 221 lb 11.2 oz (100.6 kg)  03/26/21 217 lb (98.4 kg)  03/05/21 222 lb 1.6 oz (100.7 kg)     ECOG FS:1 - Symptomatic but completely ambulatory  GENERAL: Patient is a well appearing male in no acute distress HEENT:  Sclerae anicteric.  Oropharynx clear and moist. No ulcerations or evidence of oropharyngeal candidiasis. Neck is supple.  NODES:  No cervical,  supraclavicular, or axillary lymphadenopathy palpated.  BREAST EXAM:  Deferred. LUNGS:  Clear to auscultation bilaterally.  No wheezes or rhonchi. HEART:  Regular rate and rhythm. No murmur appreciated. ABDOMEN:  Soft, nontender.  Positive, normoactive bowel sounds. No organomegaly palpated. MSK:  No focal spinal tenderness to palpation. EXTREMITIES:  No peripheral edema.   SKIN:  Clear with no obvious rashes or skin changes. No nail dyscrasia. NEURO:  Nonfocal. Well oriented.  Appropriate affect.    LAB RESULTS:  CMP     Component Value Date/Time   NA 143 04/16/2021 0756   K 4.1 04/16/2021 0756   CL 111 04/16/2021 0756   CO2 22 04/16/2021 0756   GLUCOSE 90 04/16/2021 0756   BUN 18 04/16/2021 0756   CREATININE 1.40 (H) 04/16/2021 0756   CALCIUM 8.7 (L) 04/16/2021 0756   PROT 6.8 04/16/2021 0756   ALBUMIN 3.6 04/16/2021 0756   AST 16 04/16/2021 0756   ALT 17 04/16/2021 0756   ALKPHOS 61 04/16/2021 0756   BILITOT 0.5 04/16/2021 0756   GFRNONAA 50 (L) 04/16/2021 0756   GFRAA 53 (L) 09/09/2016 0513      Lab Results  Component Value Date   WBC 7.4 04/16/2021   NEUTROABS 4.2 04/16/2021   HGB 12.8 (L) 04/16/2021   HCT 37.1 (L) 04/16/2021   MCV 89.8 04/16/2021   PLT 227 04/16/2021     Lab Results  Component Value Date   CA2729 26.2 12/15/2020        Urinalysis No results found for: COLORURINE, APPEARANCEUR, LABSPEC, PHURINE, GLUCOSEU, HGBUR, BILIRUBINUR, KETONESUR, PROTEINUR, UROBILINOGEN, NITRITE, LEUKOCYTESUR   STUDIES: ECHOCARDIOGRAM COMPLETE  Result Date: 04/07/2021    ECHOCARDIOGRAM REPORT   Patient Name:   HASSAN BLACKSHIRE Date of Exam: 04/07/2021 Medical Rec #:  [id number]       Height:       70.0 in Accession #:    [id number]      Weight:       217.0 lb Date of Birth:  1934/09/17       BSA:          2.161 m Patient Age:    54 years        BP:           131/82 mmHg Patient Gender: M               HR:           62  bpm. Exam Location:  Outpatient Procedure:  2D Echo, 3D Echo, Color Doppler, Cardiac Doppler and Strain Analysis Indications:    Z51.11 Encounter for antineoplastic chemotheraphy  History:        Patient has prior history of Echocardiogram examinations, most                 recent 12/26/2020. Abnormal ECG; Risk Factors:Hypertension.                 Breast cancer, colon cancer.  Sonographer:    Roseanna Rainbow RDCS Referring Phys: Niland  Sonographer Comments: Technically difficult study due to poor echo windows. Global longitudinal strain was attempted. Patient 27 minutes late due to admitting at [facility]. IMPRESSIONS  1. Left ventricular ejection fraction, by estimation, is 50 to 55%. Left ventricular ejection fraction by 3D volume is 52 %. The left ventricle has low normal function. The left ventricle has no regional wall motion abnormalities. Left ventricular diastolic parameters are indeterminate. The average left ventricular global longitudinal strain is -20.7 %. The global longitudinal strain is normal.  2. Right ventricular systolic function is normal. The right ventricular size is normal. Tricuspid regurgitation signal is inadequate for assessing PA pressure.  3. The mitral valve is normal in structure. Trivial mitral valve regurgitation. No evidence of mitral stenosis.  4. The aortic valve was not well visualized. Aortic valve regurgitation is not visualized. No aortic stenosis is present. Aortic valve area, by VTI measures 2.70 cm. Aortic valve mean gradient measures 4.0 mmHg. Aortic valve Vmax measures 1.29 m/s     Compared to study dated 12/26/2020, LVF has slightly decreased from 55-60% to 52% but LV strain is stable at- 20.7% (-21.9% on 12/26/2020) FINDINGS  Left Ventricle: Left ventricular ejection fraction, by estimation, is 50 to 55%. Left ventricular ejection fraction by 3D volume is 52 %. The left ventricle has low normal function. The left ventricle has no regional wall motion abnormalities. The average left ventricular global  longitudinal strain is -20.7 %. The global longitudinal strain is normal. The left ventricular internal cavity size was normal in size. There is no left ventricular hypertrophy. Left ventricular diastolic parameters are indeterminate. Normal left ventricular filling pressure. Right Ventricle: The right ventricular size is normal. No increase in right ventricular wall thickness. Right ventricular systolic function is normal. Tricuspid regurgitation signal is inadequate for assessing PA pressure. Left Atrium: Left atrial size was normal in size. Right Atrium: Right atrial size was normal in size. Pericardium: There is no evidence of pericardial effusion. Mitral Valve: The mitral valve is normal in structure. Trivial mitral valve regurgitation. No evidence of mitral valve stenosis. Tricuspid Valve: The tricuspid valve is normal in structure. Tricuspid valve regurgitation is not demonstrated. No evidence of tricuspid stenosis. Aortic Valve: The aortic valve was not well visualized. Aortic valve regurgitation is not visualized. No aortic stenosis is present. Aortic valve mean gradient measures 4.0 mmHg. Aortic valve peak gradient measures 6.7 mmHg. Aortic valve area, by VTI measures 2.70 cm. Pulmonic Valve: The pulmonic valve was normal in structure. Pulmonic valve regurgitation is not visualized. No evidence of pulmonic stenosis. Aorta: The aortic root is normal in size and structure. Venous: The inferior vena cava was not well visualized. IAS/Shunts: No atrial level shunt detected by color flow Doppler.  LEFT VENTRICLE PLAX 2D LVIDd:         4.60 cm         Diastology LVIDs:         3.50  cm         LV e' medial:    7.29 cm/s LV PW:         1.00 cm         LV E/e' medial:  13.0 LV IVS:        1.00 cm         LV e' lateral:   8.38 cm/s LVOT diam:     2.10 cm         LV E/e' lateral: 11.3 LV SV:         76 LV SV Index:   35              2D LVOT Area:     3.46 cm        Longitudinal                                Strain                                 2D Strain GLS  -23.4 % LV Volumes (MOD)               (A2C): LV vol d, MOD    108.0 ml      2D Strain GLS  -15.7 % A2C:                           (A3C): LV vol d, MOD    90.2 ml       2D Strain GLS  -23.0 % A4C:                           (A4C): LV vol s, MOD    45.7 ml       2D Strain GLS  -20.7 % A2C:                           Avg: LV vol s, MOD    44.3 ml A4C:                           3D Volume EF LV SV MOD A2C:   62.3 ml       LV 3D EF:    Left LV SV MOD A4C:   90.2 ml                    ventricul LV SV MOD BP:    55.4 ml                    ar                                             ejection                                             fraction  by 3D                                             volume is                                             52 %.                                 3D Volume EF:                                3D EF:        52 %                                LV EDV:       179 ml                                LV ESV:       87 ml                                LV SV:        93 ml RIGHT VENTRICLE             IVC RV S prime:     13.10 cm/s  IVC diam: 2.30 cm TAPSE (M-mode): 2.5 cm LEFT ATRIUM             Index        RIGHT ATRIUM           Index LA diam:        3.50 cm 1.62 cm/m   RA Area:     14.40 cm LA Vol (A2C):   54.6 ml 25.27 ml/m  RA Volume:   36.40 ml  16.84 ml/m LA Vol (A4C):   37.5 ml 17.35 ml/m LA Biplane Vol: 46.0 ml 21.29 ml/m  AORTIC VALVE AV Area (Vmax):    2.68 cm AV Area (Vmean):   2.55 cm AV Area (VTI):     2.70 cm AV Vmax:           129.00 cm/s AV Vmean:          88.400 cm/s AV VTI:            0.281 m AV Peak Grad:      6.7 mmHg AV Mean Grad:      4.0 mmHg LVOT Vmax:         100.00 cm/s LVOT Vmean:        65.100 cm/s LVOT VTI:          0.219 m LVOT/AV VTI ratio: 0.78  AORTA Ao Root diam: 3.00 cm Ao Asc diam:  2.80 cm MITRAL VALVE MV Area (PHT): 2.99 cm    SHUNTS MV Decel Time: 254 msec     Systemic VTI:  0.22 m MV E velocity: 94.70 cm/s  Systemic Diam: 2.10 cm MV A  velocity: 93.40 cm/s MV E/A ratio:  1.01 Fransico Him MD Electronically signed by Fransico Him MD Signature Date/Time: 04/07/2021/3:55:25 PM    Final      ELIGIBLE FOR AVAILABLE RESEARCH PROTOCOL: no  ASSESSMENT: 86 y.o. [place] man status post right central breast (retroareolar) biopsy 11/28/2020 for a clinical T2-T4, N0, stage IIA-IIIB invasive ductal carcinoma, estrogen receptor positive, progesterone receptor negative, HER2 amplified, with an MIB-1 of 15%  (1) genetics testing 02/13/2021 through the [facility] Panel found no deleterious mutations in AIP, ALK, APC, ATM, AXIN2, BAP1, BARD1, BLM, BMPR1A, BRCA1, BRCA2, BRIP1, CDC73, CDH1, CDK4, CDKN1B, CDKN2A, CHEK2, CTNNA1, DICER1, FANCC, FH, FLCN, GALNT12, KIF1B, LZTR1, MAX, MEN1, MET, MLH1, MSH2, MSH3, MSH6, MUTYH, NBN, NF1, NF2, NTHL1, PALB2, PHOX2B, PMS2, POT1, PRKAR1A, PTCH1, PTEN, RAD51C, RAD51D, RB1, RECQL, RET, SDHA, SDHAF2, SDHB, SDHC, SDHD, SMAD4, SMARCA4, SMARCB1, SMARCE1, STK11, SUFU, TMEM127, TP53, TSC1, TSC2, VHL and XRCC2 (sequencing and deletion/duplication); EGFR, EGLN1, HOXB13, KIT, MITF, PDGFRA, POLD1, and POLE (sequencing only); EPCAM and GREM1 (deletion/duplication only).   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    (2) status post right mastectomy 12/30/2020 for a pT2, pN0, stage IIA invasive ductal carcinoma, grade 2, with negative margins  (A) a total of one SLN was biopsied and negative  (3) no adjuvant radiation indicated  (4) anti-HER2 treatment; trastuzumab started 01/22/2021  (A) echo on 12/26/2020 shows EF of 55-60%  (B) echo November 2022  (5) tamoxifen started 03/09/2021   PLAN: Terrance Powell is here today for follow-up of his invasive breast cancer.  He continues on Trastuzumab every 3 weeks and Tamoxifen daily with good tolerance.  He has no sign of recurrence.  His  echocardiogram on 04/07/2021 was normal.  We will repeat in February 2023, orders placed today.    Terrance Powell will return every three weeks for labs and Trastuzumab.  He will see Dr. Lindi Adie in February, 2023.  We discussed his overall treatment plan in detail, along with risk factors that may increase his risk of developing breast cancer.    I have encouraged him to remain as active as possible.  He knows to call for any other issue that may develop before the next visit  Total encounter time 30 minutes.*In face-to-face visit time, chart review, lab review, order entry, and documentation of the encounter.  Wilber Bihari, NP 04/16/21 8:59 AM Medical Oncology and Hematology [facility] [place], [place] [address] Tel. [phone]    Fax. [phone]    *Total Encounter Time as defined by the Centers for Medicare and Medicaid Services includes, in addition to the face-to-face time of a patient visit (documented in the note above) non-face-to-face time: obtaining and reviewing outside history, ordering and reviewing medications, tests or procedures, care coordination (communications with other health care professionals or caregivers) and documentation in the medical record.

## 2021-05-07 ENCOUNTER — Other Ambulatory Visit: Payer: Self-pay

## 2021-05-07 ENCOUNTER — Inpatient Hospital Stay: Payer: Medicare Other

## 2021-05-07 VITALS — BP 155/76 | HR 66 | Temp 98.0°F | Resp 16

## 2021-05-07 DIAGNOSIS — C50121 Malignant neoplasm of central portion of right male breast: Secondary | ICD-10-CM | POA: Diagnosis not present

## 2021-05-07 DIAGNOSIS — Z170 Estrogen receptor positive status [ER+]: Secondary | ICD-10-CM

## 2021-05-07 LAB — CBC WITH DIFFERENTIAL/PLATELET
Abs Immature Granulocytes: 0.01 10*3/uL (ref 0.00–0.07)
Basophils Absolute: 0.1 10*3/uL (ref 0.0–0.1)
Basophils Relative: 1 %
Eosinophils Absolute: 0.3 10*3/uL (ref 0.0–0.5)
Eosinophils Relative: 4 %
HCT: 37.6 % — ABNORMAL LOW (ref 39.0–52.0)
Hemoglobin: 13.2 g/dL (ref 13.0–17.0)
Immature Granulocytes: 0 %
Lymphocytes Relative: 30 %
Lymphs Abs: 2.3 10*3/uL (ref 0.7–4.0)
MCH: 30.9 pg (ref 26.0–34.0)
MCHC: 35.1 g/dL (ref 30.0–36.0)
MCV: 88.1 fL (ref 80.0–100.0)
Monocytes Absolute: 0.5 10*3/uL (ref 0.1–1.0)
Monocytes Relative: 7 %
Neutro Abs: 4.5 10*3/uL (ref 1.7–7.7)
Neutrophils Relative %: 58 %
Platelets: 292 10*3/uL (ref 150–400)
RBC: 4.27 MIL/uL (ref 4.22–5.81)
RDW: 12.7 % (ref 11.5–15.5)
WBC: 7.7 10*3/uL (ref 4.0–10.5)
nRBC: 0 % (ref 0.0–0.2)

## 2021-05-07 LAB — COMPREHENSIVE METABOLIC PANEL
ALT: 19 U/L (ref 0–44)
AST: 17 U/L (ref 15–41)
Albumin: 3.9 g/dL (ref 3.5–5.0)
Alkaline Phosphatase: 51 U/L (ref 38–126)
Anion gap: 8 (ref 5–15)
BUN: 17 mg/dL (ref 8–23)
CO2: 25 mmol/L (ref 22–32)
Calcium: 8.9 mg/dL (ref 8.9–10.3)
Chloride: 109 mmol/L (ref 98–111)
Creatinine, Ser: 1.37 mg/dL — ABNORMAL HIGH (ref 0.61–1.24)
GFR, Estimated: 51 mL/min — ABNORMAL LOW (ref >60)
Glucose, Bld: 94 mg/dL (ref 70–99)
Potassium: 3.7 mmol/L (ref 3.5–5.1)
Sodium: 142 mmol/L (ref 135–145)
Total Bilirubin: 0.5 mg/dL (ref 0.3–1.2)
Total Protein: 6.8 g/dL (ref 6.5–8.1)

## 2021-05-07 MED ORDER — SODIUM CHLORIDE 0.9 % IV SOLN: Freq: Once | INTRAVENOUS | Status: AC

## 2021-05-07 MED ORDER — DIPHENHYDRAMINE HCL 25 MG PO CAPS
50.0000 mg | ORAL_CAPSULE | Freq: Once | ORAL | Status: AC
  Administered 2021-05-07: 10:00:00 50 mg via ORAL
  Filled 2021-05-07: qty 2

## 2021-05-07 MED ORDER — ACETAMINOPHEN 325 MG PO TABS
650.0000 mg | ORAL_TABLET | Freq: Once | ORAL | Status: AC
  Administered 2021-05-07: 10:00:00 650 mg via ORAL
  Filled 2021-05-07: qty 2

## 2021-05-07 MED ORDER — TRASTUZUMAB-ANNS CHEMO 150 MG IV SOLR
6.0000 mg/kg | Freq: Once | INTRAVENOUS | Status: AC
  Administered 2021-05-07: 10:00:00 609 mg via INTRAVENOUS
  Filled 2021-05-07: qty 29

## 2021-05-07 NOTE — Patient Instructions (Addendum)
[facility] MEDICAL ONCOLOGY  Discharge Instructions: Thank you for choosing [facility] to provide your oncology and hematology care.   If you have a lab appointment with the Cancer Center, please go directly to the Cancer Center and check in at the registration area.   Wear comfortable clothing and clothing appropriate for easy access to any Portacath or PICC line.   We strive to give you quality time with your provider. You may need to reschedule your appointment if you arrive late (15 or more minutes).  Arriving late affects you and other patients whose appointments are after yours.  Also, if you miss three or more appointments without notifying the office, you may be dismissed from the clinic at the provider's discretion.      For prescription refill requests, have your pharmacy contact our office and allow 72 hours for refills to be completed.    Today you received the following chemotherapy and/or immunotherapy agents: trastuzumab-anns (Herceptin)      To help prevent nausea and vomiting after your treatment, we encourage you to take your nausea medication as directed.  BELOW ARE SYMPTOMS THAT SHOULD BE REPORTED IMMEDIATELY: *FEVER GREATER THAN 100.4 F (38 C) OR HIGHER *CHILLS OR SWEATING *NAUSEA AND VOMITING THAT IS NOT CONTROLLED WITH YOUR NAUSEA MEDICATION *UNUSUAL SHORTNESS OF BREATH *UNUSUAL BRUISING OR BLEEDING *URINARY PROBLEMS (pain or burning when urinating, or frequent urination) *BOWEL PROBLEMS (unusual diarrhea, constipation, pain near the anus) TENDERNESS IN MOUTH AND THROAT WITH OR WITHOUT PRESENCE OF ULCERS (sore throat, sores in mouth, or a toothache) UNUSUAL RASH, SWELLING OR PAIN  UNUSUAL VAGINAL DISCHARGE OR ITCHING   Items with * indicate a potential emergency and should be followed up as soon as possible or go to the Emergency Department if any problems should occur.  Please show the CHEMOTHERAPY ALERT CARD or IMMUNOTHERAPY ALERT  CARD at check-in to the Emergency Department and triage nurse.  Should you have questions after your visit or need to cancel or reschedule your appointment, please contact [facility]  Dept: [phone]  and follow the prompts.  Office hours are 8:00 a.m. to 4:30 p.m. Monday - Friday. Please note that voicemails left after 4:00 p.m. may not be returned until the following business day.  We are closed weekends and major holidays. You have access to a nurse at all times for urgent questions. Please call the main number to the clinic Dept: [phone] and follow the prompts.   For any non-urgent questions, you may also contact your provider using MyChart. We now offer e-Visits for anyone 18 and older to request care online for non-urgent symptoms. For details visit [email or web address].   Also download the MyChart app! Go to the app store, search "MyChart", open the app, select [facility], and log in with your MyChart username and password.  Due to Covid, a mask is required upon entering the hospital/clinic. If you do not have a mask, one will be given to you upon arrival. For doctor visits, patients may have 1 support person aged 18 or older with them. For treatment visits, patients cannot have anyone with them due to current Covid guidelines and our immunocompromised population.  

## 2021-05-28 ENCOUNTER — Inpatient Hospital Stay: Payer: Medicare Other | Attending: Oncology

## 2021-05-28 ENCOUNTER — Other Ambulatory Visit: Payer: Self-pay

## 2021-05-28 ENCOUNTER — Inpatient Hospital Stay: Payer: Medicare Other

## 2021-05-28 VITALS — BP 158/85 | HR 67 | Temp 98.5°F | Resp 16

## 2021-05-28 DIAGNOSIS — Z8041 Family history of malignant neoplasm of ovary: Secondary | ICD-10-CM | POA: Diagnosis not present

## 2021-05-28 DIAGNOSIS — Z800 Family history of malignant neoplasm of digestive organs: Secondary | ICD-10-CM | POA: Insufficient documentation

## 2021-05-28 DIAGNOSIS — Z5112 Encounter for antineoplastic immunotherapy: Secondary | ICD-10-CM | POA: Diagnosis present

## 2021-05-28 DIAGNOSIS — Z79899 Other long term (current) drug therapy: Secondary | ICD-10-CM | POA: Diagnosis not present

## 2021-05-28 DIAGNOSIS — C50121 Malignant neoplasm of central portion of right male breast: Secondary | ICD-10-CM | POA: Insufficient documentation

## 2021-05-28 DIAGNOSIS — Z170 Estrogen receptor positive status [ER+]: Secondary | ICD-10-CM | POA: Diagnosis not present

## 2021-05-28 DIAGNOSIS — Z79810 Long term (current) use of selective estrogen receptor modulators (SERMs): Secondary | ICD-10-CM | POA: Insufficient documentation

## 2021-05-28 DIAGNOSIS — Z87891 Personal history of nicotine dependence: Secondary | ICD-10-CM | POA: Diagnosis not present

## 2021-05-28 DIAGNOSIS — Z803 Family history of malignant neoplasm of breast: Secondary | ICD-10-CM | POA: Diagnosis not present

## 2021-05-28 LAB — CBC WITH DIFFERENTIAL/PLATELET
Abs Immature Granulocytes: 0.02 10*3/uL (ref 0.00–0.07)
Basophils Absolute: 0.1 10*3/uL (ref 0.0–0.1)
Basophils Relative: 1 %
Eosinophils Absolute: 0.2 10*3/uL (ref 0.0–0.5)
Eosinophils Relative: 3 %
HCT: 39 % (ref 39.0–52.0)
Hemoglobin: 13.9 g/dL (ref 13.0–17.0)
Immature Granulocytes: 0 %
Lymphocytes Relative: 28 %
Lymphs Abs: 2.3 10*3/uL (ref 0.7–4.0)
MCH: 31.2 pg (ref 26.0–34.0)
MCHC: 35.6 g/dL (ref 30.0–36.0)
MCV: 87.4 fL (ref 80.0–100.0)
Monocytes Absolute: 0.6 10*3/uL (ref 0.1–1.0)
Monocytes Relative: 8 %
Neutro Abs: 5 10*3/uL (ref 1.7–7.7)
Neutrophils Relative %: 60 %
Platelets: 278 10*3/uL (ref 150–400)
RBC: 4.46 MIL/uL (ref 4.22–5.81)
RDW: 12.7 % (ref 11.5–15.5)
WBC: 8.3 10*3/uL (ref 4.0–10.5)
nRBC: 0 % (ref 0.0–0.2)

## 2021-05-28 LAB — COMPREHENSIVE METABOLIC PANEL
ALT: 17 U/L (ref 0–44)
AST: 17 U/L (ref 15–41)
Albumin: 4 g/dL (ref 3.5–5.0)
Alkaline Phosphatase: 54 U/L (ref 38–126)
Anion gap: 8 (ref 5–15)
BUN: 21 mg/dL (ref 8–23)
CO2: 23 mmol/L (ref 22–32)
Calcium: 9.1 mg/dL (ref 8.9–10.3)
Chloride: 109 mmol/L (ref 98–111)
Creatinine, Ser: 1.39 mg/dL — ABNORMAL HIGH (ref 0.61–1.24)
GFR, Estimated: 50 mL/min — ABNORMAL LOW (ref >60)
Glucose, Bld: 99 mg/dL (ref 70–99)
Potassium: 3.9 mmol/L (ref 3.5–5.1)
Sodium: 140 mmol/L (ref 135–145)
Total Bilirubin: 0.5 mg/dL (ref 0.3–1.2)
Total Protein: 7.3 g/dL (ref 6.5–8.1)

## 2021-05-28 MED ORDER — TRASTUZUMAB-ANNS CHEMO 150 MG IV SOLR
6.0000 mg/kg | Freq: Once | INTRAVENOUS | Status: AC
  Administered 2021-05-28: 609 mg via INTRAVENOUS
  Filled 2021-05-28: qty 29

## 2021-05-28 MED ORDER — ACETAMINOPHEN 325 MG PO TABS
650.0000 mg | ORAL_TABLET | Freq: Once | ORAL | Status: AC
  Administered 2021-05-28: 650 mg via ORAL
  Filled 2021-05-28: qty 2

## 2021-05-28 MED ORDER — SODIUM CHLORIDE 0.9 % IV SOLN: Freq: Once | INTRAVENOUS | Status: AC

## 2021-05-28 MED ORDER — DIPHENHYDRAMINE HCL 25 MG PO CAPS
50.0000 mg | ORAL_CAPSULE | Freq: Once | ORAL | Status: AC
  Administered 2021-05-28: 50 mg via ORAL
  Filled 2021-05-28: qty 2

## 2021-05-28 NOTE — Patient Instructions (Signed)
[facility]  Discharge Instructions: ?Thank you for choosing [facility] to provide your oncology and hematology care.  ? ?If you have a lab appointment with the Cancer Center, please go directly to the Cancer Center and check in at the registration area. ?  ?Wear comfortable clothing and clothing appropriate for easy access to any Portacath or PICC line.  ? ?We strive to give you quality time with your provider. You may need to reschedule your appointment if you arrive late (15 or more minutes).  Arriving late affects you and other patients whose appointments are after yours.  Also, if you miss three or more appointments without notifying the office, you may be dismissed from the clinic at the provider?s discretion.    ?  ?For prescription refill requests, have your pharmacy contact our office and allow 72 hours for refills to be completed.   ? ?Today you received the following chemotherapy and/or immunotherapy agents: Trastuzumab    ?  ?To help prevent nausea and vomiting after your treatment, we encourage you to take your nausea medication as directed. ? ?BELOW ARE SYMPTOMS THAT SHOULD BE REPORTED IMMEDIATELY: ?*FEVER GREATER THAN 100.4 F (38 ?C) OR HIGHER ?*CHILLS OR SWEATING ?*NAUSEA AND VOMITING THAT IS NOT CONTROLLED WITH YOUR NAUSEA MEDICATION ?*UNUSUAL SHORTNESS OF BREATH ?*UNUSUAL BRUISING OR BLEEDING ?*URINARY PROBLEMS (pain or burning when urinating, or frequent urination) ?*BOWEL PROBLEMS (unusual diarrhea, constipation, pain near the anus) ?TENDERNESS IN MOUTH AND THROAT WITH OR WITHOUT PRESENCE OF ULCERS (sore throat, sores in mouth, or a toothache) ?UNUSUAL RASH, SWELLING OR PAIN  ?UNUSUAL VAGINAL DISCHARGE OR ITCHING  ? ?Items with * indicate a potential emergency and should be followed up as soon as possible or go to the Emergency Department if any problems should occur. ? ?Please show the CHEMOTHERAPY ALERT CARD or IMMUNOTHERAPY ALERT CARD at check-in  to the Emergency Department and triage nurse. ? ?Should you have questions after your visit or need to cancel or reschedule your appointment, please contact [facility]  Dept: [phone]  and follow the prompts.  Office hours are 8:00 a.m. to 4:30 p.m. Monday - Friday. Please note that voicemails left after 4:00 p.m. may not be returned until the following business day.  We are closed weekends and major holidays. You have access to a nurse at all times for urgent questions. Please call the main number to the clinic Dept: [phone] and follow the prompts. ? ? ?For any non-urgent questions, you may also contact your provider using MyChart. We now offer e-Visits for anyone 18 and older to request care online for non-urgent symptoms. For details visit [email or web address]. ?  ?Also download the MyChart app! Go to the app store, search "MyChart", open the app, select [place], and log in with your MyChart username and password. ? ?Due to Covid, a mask is required upon entering the hospital/clinic. If you do not have a mask, one will be given to you upon arrival. For doctor visits, patients may have 1 support person aged 18 or older with them. For treatment visits, patients cannot have anyone with them due to current Covid guidelines and our immunocompromised population.  ? ?

## 2021-06-17 NOTE — Progress Notes (Signed)
Patient Care Team: Orpah Melter, MD as PCP - General (Family Medicine) [name], MD as Consulting Physician (General Surgery) Rockwell Germany, RN as Oncology Nurse Navigator Mauro Kaufmann, RN as Oncology Nurse Navigator Gery Pray, MD as Consulting Physician (Radiation Oncology)  DIAGNOSIS:    ICD-10-CM   1. 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, HER2 positive, with a Ki-67 of 15%   12/15/2020 Cancer Staging   Staging form: Breast, AJCC 8th Edition - Clinical: Stage IIIB (cT4, cN0, cM0, G2, ER+, PR-, HER2+) - Signed by Chauncey Cruel, MD on 12/15/2020 Histologic grading system: 3 grade system    12/30/2020 Surgery   Right mastectomy 12/30/2020 for a pT2, pN0, stage IIA invasive ductal carcinoma, grade 2, with negative margins 0/1 lymph node negative   01/22/2021 -  Chemotherapy   Patient is on Treatment Plan : BREAST Trastuzumab q21d     02/13/2021 Genetic Testing   Negative hereditary cancer genetic testing: no pathogenic variants detected in [facility] CancerNext-Expanded +RNAinsight Panel.  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  The report date is February 13, 2021.   The CancerNext-Expanded gene panel offered by [facility] and includes sequencing, rearrangement, and RNA analysis for the following 77 genes: AIP, ALK, APC, ATM, AXIN2, BAP1, BARD1, BLM, BMPR1A, BRCA1, BRCA2, BRIP1, CDC73, CDH1, CDK4, CDKN1B, CDKN2A, CHEK2, CTNNA1, DICER1, FANCC, FH, FLCN, GALNT12, KIF1B, LZTR1, MAX, MEN1, MET, MLH1, MSH2, MSH3, MSH6, MUTYH, NBN, NF1,  NF2, NTHL1, PALB2, PHOX2B, PMS2, POT1, PRKAR1A, PTCH1, PTEN, RAD51C, RAD51D, RB1, RECQL, RET, SDHA, SDHAF2, SDHB, SDHC, SDHD, SMAD4, SMARCA4, SMARCB1, SMARCE1, STK11, SUFU, TMEM127, TP53, TSC1, TSC2, VHL and XRCC2 (sequencing and deletion/duplication); EGFR, EGLN1, HOXB13, KIT, MITF, PDGFRA, POLD1, and POLE (sequencing only); EPCAM and GREM1 (deletion/duplication only).    03/09/2021 -  Anti-estrogen oral therapy   tamoxifen started 03/09/2021     CHIEF COMPLIANT: Follow-up of estrogen receptor and Her2 positive breast cancer  INTERVAL HISTORY: Terrance Powell is a 87 y.o. with above-mentioned history of estrogen receptor and Her2 positive breast cancer. She presents to the clinic today for follow-up.  ALLERGIES:  is allergic to penicillins.  MEDICATIONS:  Current Outpatient Medications  Medication Sig Dispense Refill   bumetanide (BUMEX) 0.5 MG tablet Take 0.5 mg by mouth daily.     lisinopril (PRINIVIL,ZESTRIL) 10 MG tablet Take 20 mg by mouth daily. Takes 2 tablets     tamoxifen (NOLVADEX) 20 MG tablet Take 20 mg by mouth daily.     No current facility-administered medications for this visit.    PHYSICAL EXAMINATION: ECOG PERFORMANCE STATUS: 1 - Symptomatic but completely ambulatory  Vitals:   06/18/21 0901  BP: (!) 168/80  Pulse: 65  Temp: 98.7 F (37.1 C)  SpO2: 98%   Filed Weights   06/18/21 0901  Weight: 214 lb 4.8 oz (97.2 kg)    BREAST: No palpable masses or nodules in either right or left breasts. No palpable axillary supraclavicular or infraclavicular adenopathy no breast tenderness or nipple discharge. (exam performed in the presence of a chaperone)  LABORATORY DATA:  I have reviewed the  data as listed CMP Latest Ref Rng & Units 05/28/2021 05/07/2021 04/16/2021  Glucose 70 - 99 mg/dL 99 94 90  BUN 8 - 23 mg/dL 21 17 18   Creatinine 0.61 - 1.24 mg/dL 1.39(H) 1.37(H) 1.40(H)  Sodium 135 - 145 mmol/L 140 142 143  Potassium 3.5 - 5.1 mmol/L 3.9 3.7 4.1  Chloride  98 - 111 mmol/L 109 109 111  CO2 22 - 32 mmol/L 23 25 22   Calcium 8.9 - 10.3 mg/dL 9.1 8.9 8.7(L)  Total Protein 6.5 - 8.1 g/dL 7.3 6.8 6.8  Total Bilirubin 0.3 - 1.2 mg/dL 0.5 0.5 0.5  Alkaline Phos 38 - 126 U/L 54 51 61  AST 15 - 41 U/L 17 17 16   ALT 0 - 44 U/L 17 19 17     Lab Results  Component Value Date   WBC 9.3 06/18/2021   HGB 13.4 06/18/2021   HCT 39.6 06/18/2021   MCV 90.4 06/18/2021   PLT 278 06/18/2021   NEUTROABS 5.6 06/18/2021    ASSESSMENT & PLAN:  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    No orders of the defined types were placed in this encounter.  The patient has a good understanding of the overall plan. he agrees with it. he will call with any problems that may develop before the next visit here.  Total time spent: 30 mins including face to face time and time spent for planning, charting and coordination of care  Rulon Eisenmenger, MD, MPH 06/18/2021  I, Thana Ates, am acting as scribe for Dr. Nicholas Lose.  I have reviewed the above documentation for accuracy and completeness, and I agree with the above.

## 2021-06-18 ENCOUNTER — Encounter: Payer: Self-pay | Admitting: *Deleted

## 2021-06-18 ENCOUNTER — Inpatient Hospital Stay: Payer: Medicare Other | Attending: Oncology

## 2021-06-18 ENCOUNTER — Other Ambulatory Visit: Payer: Self-pay

## 2021-06-18 ENCOUNTER — Other Ambulatory Visit: Payer: Self-pay | Admitting: *Deleted

## 2021-06-18 ENCOUNTER — Inpatient Hospital Stay: Payer: Medicare Other

## 2021-06-18 ENCOUNTER — Inpatient Hospital Stay: Payer: Medicare Other | Admitting: Hematology and Oncology

## 2021-06-18 VITALS — BP 155/57 | HR 66 | Resp 17

## 2021-06-18 DIAGNOSIS — Z79899 Other long term (current) drug therapy: Secondary | ICD-10-CM | POA: Diagnosis not present

## 2021-06-18 DIAGNOSIS — C50121 Malignant neoplasm of central portion of right male breast: Secondary | ICD-10-CM

## 2021-06-18 DIAGNOSIS — Z85038 Personal history of other malignant neoplasm of large intestine: Secondary | ICD-10-CM | POA: Diagnosis not present

## 2021-06-18 DIAGNOSIS — Z170 Estrogen receptor positive status [ER+]: Secondary | ICD-10-CM

## 2021-06-18 DIAGNOSIS — Z79810 Long term (current) use of selective estrogen receptor modulators (SERMs): Secondary | ICD-10-CM | POA: Diagnosis not present

## 2021-06-18 DIAGNOSIS — Z5112 Encounter for antineoplastic immunotherapy: Secondary | ICD-10-CM | POA: Insufficient documentation

## 2021-06-18 DIAGNOSIS — Z9011 Acquired absence of right breast and nipple: Secondary | ICD-10-CM | POA: Insufficient documentation

## 2021-06-18 DIAGNOSIS — Z5181 Encounter for therapeutic drug level monitoring: Secondary | ICD-10-CM

## 2021-06-18 LAB — CBC WITH DIFFERENTIAL/PLATELET
Abs Immature Granulocytes: 0.03 10*3/uL (ref 0.00–0.07)
Basophils Absolute: 0.1 10*3/uL (ref 0.0–0.1)
Basophils Relative: 1 %
Eosinophils Absolute: 0.4 10*3/uL (ref 0.0–0.5)
Eosinophils Relative: 4 %
HCT: 39.6 % (ref 39.0–52.0)
Hemoglobin: 13.4 g/dL (ref 13.0–17.0)
Immature Granulocytes: 0 %
Lymphocytes Relative: 27 %
Lymphs Abs: 2.5 10*3/uL (ref 0.7–4.0)
MCH: 30.6 pg (ref 26.0–34.0)
MCHC: 33.8 g/dL (ref 30.0–36.0)
MCV: 90.4 fL (ref 80.0–100.0)
Monocytes Absolute: 0.7 10*3/uL (ref 0.1–1.0)
Monocytes Relative: 8 %
Neutro Abs: 5.6 10*3/uL (ref 1.7–7.7)
Neutrophils Relative %: 60 %
Platelets: 278 10*3/uL (ref 150–400)
RBC: 4.38 MIL/uL (ref 4.22–5.81)
RDW: 12.9 % (ref 11.5–15.5)
WBC: 9.3 10*3/uL (ref 4.0–10.5)
nRBC: 0 % (ref 0.0–0.2)

## 2021-06-18 LAB — COMPREHENSIVE METABOLIC PANEL
ALT: 17 U/L (ref 0–44)
AST: 17 U/L (ref 15–41)
Albumin: 4.1 g/dL (ref 3.5–5.0)
Alkaline Phosphatase: 49 U/L (ref 38–126)
Anion gap: 6 (ref 5–15)
BUN: 22 mg/dL (ref 8–23)
CO2: 28 mmol/L (ref 22–32)
Calcium: 9.1 mg/dL (ref 8.9–10.3)
Chloride: 108 mmol/L (ref 98–111)
Creatinine, Ser: 1.46 mg/dL — ABNORMAL HIGH (ref 0.61–1.24)
GFR, Estimated: 47 mL/min — ABNORMAL LOW (ref >60)
Glucose, Bld: 94 mg/dL (ref 70–99)
Potassium: 3.8 mmol/L (ref 3.5–5.1)
Sodium: 142 mmol/L (ref 135–145)
Total Bilirubin: 0.5 mg/dL (ref 0.3–1.2)
Total Protein: 7.3 g/dL (ref 6.5–8.1)

## 2021-06-18 MED ORDER — ACETAMINOPHEN 325 MG PO TABS
650.0000 mg | ORAL_TABLET | Freq: Once | ORAL | Status: AC
  Administered 2021-06-18: 650 mg via ORAL
  Filled 2021-06-18: qty 2

## 2021-06-18 MED ORDER — DIPHENHYDRAMINE HCL 25 MG PO CAPS
50.0000 mg | ORAL_CAPSULE | Freq: Once | ORAL | Status: AC
  Administered 2021-06-18: 50 mg via ORAL
  Filled 2021-06-18: qty 2

## 2021-06-18 MED ORDER — TRASTUZUMAB-ANNS CHEMO 150 MG IV SOLR
6.0000 mg/kg | Freq: Once | INTRAVENOUS | Status: AC
  Administered 2021-06-18: 609 mg via INTRAVENOUS
  Filled 2021-06-18: qty 29

## 2021-06-18 MED ORDER — SODIUM CHLORIDE 0.9 % IV SOLN: Freq: Once | INTRAVENOUS | Status: AC

## 2021-06-18 NOTE — Patient Instructions (Signed)
[facility]  Discharge Instructions: Thank you for choosing [facility] to provide your oncology and hematology care.   If you have a lab appointment with the Cancer Center, please go directly to the Cancer Center and check in at the registration area.   Wear comfortable clothing and clothing appropriate for easy access to any Portacath or PICC line.   We strive to give you quality time with your provider. You may need to reschedule your appointment if you arrive late (15 or more minutes).  Arriving late affects you and other patients whose appointments are after yours.  Also, if you miss three or more appointments without notifying the office, you may be dismissed from the clinic at the provider's discretion.      For prescription refill requests, have your pharmacy contact our office and allow 72 hours for refills to be completed.    Today you received the following chemotherapy and/or immunotherapy agents: trastuzumab-anns (Herceptin)      To help prevent nausea and vomiting after your treatment, we encourage you to take your nausea medication as directed.  BELOW ARE SYMPTOMS THAT SHOULD BE REPORTED IMMEDIATELY: *FEVER GREATER THAN 100.4 F (38 C) OR HIGHER *CHILLS OR SWEATING *NAUSEA AND VOMITING THAT IS NOT CONTROLLED WITH YOUR NAUSEA MEDICATION *UNUSUAL SHORTNESS OF BREATH *UNUSUAL BRUISING OR BLEEDING *URINARY PROBLEMS (pain or burning when urinating, or frequent urination) *BOWEL PROBLEMS (unusual diarrhea, constipation, pain near the anus) TENDERNESS IN MOUTH AND THROAT WITH OR WITHOUT PRESENCE OF ULCERS (sore throat, sores in mouth, or a toothache) UNUSUAL RASH, SWELLING OR PAIN  UNUSUAL VAGINAL DISCHARGE OR ITCHING   Items with * indicate a potential emergency and should be followed up as soon as possible or go to the Emergency Department if any problems should occur.  Please show the CHEMOTHERAPY ALERT CARD or IMMUNOTHERAPY ALERT  CARD at check-in to the Emergency Department and triage nurse.  Should you have questions after your visit or need to cancel or reschedule your appointment, please contact [facility]  Dept: [phone]  and follow the prompts.  Office hours are 8:00 a.m. to 4:30 p.m. Monday - Friday. Please note that voicemails left after 4:00 p.m. may not be returned until the following business day.  We are closed weekends and major holidays. You have access to a nurse at all times for urgent questions. Please call the main number to the clinic Dept: [phone] and follow the prompts.   For any non-urgent questions, you may also contact your provider using MyChart. We now offer e-Visits for anyone 18 and older to request care online for non-urgent symptoms. For details visit [email or web address].   Also download the MyChart app! Go to the app store, search "MyChart", open the app, select [place], and log in with your MyChart username and password.  Due to Covid, a mask is required upon entering the hospital/clinic. If you do not have a mask, one will be given to you upon arrival. For doctor visits, patients may have 1 support person aged 18 or older with them. For treatment visits, patients cannot have anyone with them due to current Covid guidelines and our immunocompromised population.  

## 2021-06-18 NOTE — Assessment & Plan Note (Signed)
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

## 2021-07-02 ENCOUNTER — Other Ambulatory Visit: Payer: Self-pay

## 2021-07-02 ENCOUNTER — Ambulatory Visit (HOSPITAL_COMMUNITY)
Admission: RE | Admit: 2021-07-02 | Discharge: 2021-07-02 | Disposition: A | Discharge status: Home / Self Care | Payer: Medicare Other | Source: Ambulatory Visit | Attending: Hematology and Oncology | Admitting: Hematology and Oncology

## 2021-07-02 DIAGNOSIS — Z79899 Other long term (current) drug therapy: Secondary | ICD-10-CM | POA: Insufficient documentation

## 2021-07-02 DIAGNOSIS — I10 Essential (primary) hypertension: Secondary | ICD-10-CM | POA: Diagnosis not present

## 2021-07-02 DIAGNOSIS — Z0189 Encounter for other specified special examinations: Secondary | ICD-10-CM | POA: Diagnosis not present

## 2021-07-02 DIAGNOSIS — Z5181 Encounter for therapeutic drug level monitoring: Secondary | ICD-10-CM | POA: Diagnosis present

## 2021-07-02 DIAGNOSIS — I340 Nonrheumatic mitral (valve) insufficiency: Secondary | ICD-10-CM | POA: Insufficient documentation

## 2021-07-02 LAB — ECHOCARDIOGRAM COMPLETE
AR max vel: 1.22 cm2
AV Area VTI: 1.39 cm2
AV Area mean vel: 1.2 cm2
AV Mean grad: 4 mmHg
AV Peak grad: 7.5 mmHg
Ao pk vel: 1.37 m/s
Area-P 1/2: 2.12 cm2
P 1/2 time: 627 msec
S' Lateral: 2.8 cm

## 2021-07-02 NOTE — Progress Notes (Signed)
Echocardiogram 2D Echocardiogram has been performed.  Terrance Powell 07/02/2021, 9:59 AM

## 2021-07-09 ENCOUNTER — Other Ambulatory Visit: Payer: Medicare Other

## 2021-07-09 ENCOUNTER — Other Ambulatory Visit: Payer: Self-pay | Admitting: *Deleted

## 2021-07-09 ENCOUNTER — Inpatient Hospital Stay: Payer: Medicare Other

## 2021-07-09 ENCOUNTER — Telehealth: Payer: Self-pay

## 2021-07-09 DIAGNOSIS — Z170 Estrogen receptor positive status [ER+]: Secondary | ICD-10-CM

## 2021-07-09 NOTE — Progress Notes (Signed)
Per MD request RN placed referral to Dr. Haroldine Laws for evaluation of decreased EF while on Trastuzumab.

## 2021-07-21 NOTE — Progress Notes (Signed)
? ?CARDIO-ONCOLOGY CLINIC CONSULT NOTE ? ?Referring Physician: Dr. Lindi Adie ?Primary Care: Orpah Melter, MD ?Primary Cardiologist: None ? ? ?HPI:  Terrance Powell is an  87 y.o. male with HTN,  CKD 3a, previous colon CA and right breast cancer referred by Dr. Lindi Adie for enrollment into the Cardio-Oncology program due to possible Herceptin cardiotoxicity.  ? ?Diagnosed with R breast cancer in 7/22. Had R mastectomy in 8/22. Stage IIa ER+, HER2 +, PR-  Started therapy with Herceptin q21 days in 9/22 ? ?EF 2/23 EF  45-50% GLS -14.5 (I felt 50-55%) ?Echo 11/22 EF 50-55% GLS -20.7  ?Echo 8/22 55-60% GLS -21.9 ? ?Feels good. Denies CP or SOB. No edema, orthopnea or PND. BP well controlled ? ? ? ?Review of Systems: [y] = yes, [ ]  = no  ? ?General: Weight gain [ ] ; Weight loss [ ] ; Anorexia [ ] ; Fatigue [ ] ; Fever [ ] ; Chills [ ] ; Weakness [ ]   ?Cardiac: Chest pain/pressure [ ] ; Resting SOB [ ] ; Exertional SOB [ ] ; Orthopnea [ ] ; Pedal Edema [ ] ; Palpitations [ ] ; Syncope [ ] ; Presyncope [ ] ; Paroxysmal nocturnal dyspnea[ ]   ?Pulmonary: Cough [ ] ; Wheezing[ ] ; Hemoptysis[ ] ; Sputum [ ] ; Snoring [ ]   ?GI: Vomiting[ ] ; Dysphagia[ ] ; Melena[ ] ; Hematochezia [ ] ; Heartburn[ ] ; Abdominal pain [ ] ; Constipation [ ] ; Diarrhea [ ] ; BRBPR [ ]   ?GU: Hematuria[ ] ; Dysuria [ ] ; Nocturia[ ]   ?Vascular: Pain in legs with walking [ ] ; Pain in feet with lying flat [ ] ; Non-healing sores [ ] ; Stroke [ ] ; TIA [ ] ; Slurred speech [ ] ;  ?Neuro: Headaches[ ] ; Vertigo[ ] ; Seizures[ ] ; Paresthesias[ ] ;Blurred vision [ ] ; Diplopia [ ] ; Vision changes [ ]   ?Ortho/Skin: Arthritis [ ] ; Joint pain [ ] ; Muscle pain [ ] ; Joint swelling [ ] ; Back Pain [ ] ; Rash [ ]   ?Psych: Depression[ ] ; Anxiety[ ]   ?Heme: Bleeding problems [ ] ; Clotting disorders [ ] ; Anemia [ ]   ?Endocrine: Diabetes [ ] ; Thyroid dysfunction[ ]  ? ? ?Past Medical History:  ?Diagnosis Date  ? Constipation   ? Family history of breast cancer 02/04/2021  ? Family history of colon cancer  02/04/2021  ? Family history of ovarian cancer 02/04/2021  ? Head injury ~ 1942  ? loss of consciouss  ? History of gout   ? Hypertension   ? 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 ? ?Current Outpatient Medications  ?Medication Sig Dispense Refill  ? bumetanide (BUMEX) 0.5 MG tablet Take 0.5 mg by mouth daily.    ? lisinopril (PRINIVIL,ZESTRIL) 10 MG tablet Take 20 mg by mouth daily. Takes 2 tablets    ? tamoxifen (NOLVADEX) 20 MG tablet Take 20 mg by mouth daily.    ? ?No current facility-administered medications for this encounter.  ? ? ?Allergies  ?Allergen Reactions  ? Penicillins Diarrhea, Itching and Other (See Comments)  ?  TESTICULAR PAIN ? ?Has patient had a PCN reaction causing immediate rash, facial/tongue/throat swelling, SOB or lightheadedness with hypotension: No ?SEVERE RASH INVOLVING MUCUS MEMBRANES or SKIN NECROSIS: #  #  #  YES  #  #  #  ?Has patient had a PCN reaction that required hospitalization No ?Has patient had a PCN reaction occurring within the last 10 years: No. ?  ? ? ?  ?Social History  ? ?Socioeconomic History  ? Marital status: Married  ?  Spouse name: Not on file  ? Number of children: Not  on file  ? Years of education: Not on file  ? Highest education level: Not on file  ?Occupational History  ? Not on file  ?Tobacco Use  ? Smoking status: Former  ? Smokeless tobacco: Never  ? Tobacco comments:  ?  "toyed with " none in over 40 years"  ?Vaping Use  ? Vaping Use: Never used  ?Substance and Sexual Activity  ? Alcohol use: Yes  ?  Comment: 09/08/2016 "might have a couple drinks on holidays"  ? Drug use: No  ? Sexual activity: Not Currently  ?Other Topics Concern  ? Not on file  ?Social History Narrative  ? Not on file  ? ?Social Determinants of Health  ? ?Financial Resource Strain: Not on file  ?Food Insecurity: Not on file  ?Transportation Needs: Not on file  ?Physical Activity: Not on file  ?Stress: Not on file  ?Social Connections: Not on file  ?Intimate  Partner Violence: Not on file  ? ? ?  ?Family History  ?Problem Relation Age of Onset  ? Breast cancer Mother   ?     dx 32s-70s  ? Colon cancer Father 31  ?     metastatic  ? Breast cancer Maternal Aunt   ?     x2 maternal aunts; dx 5s  ? Cancer Paternal Aunt   ?     unknown type; d. 78s  ? Esophageal cancer Paternal Uncle   ?     d. 26s  ? Cancer Paternal Uncle   ?     unknown type; d. 90s  ? Ovarian cancer Paternal Grandmother   ?     d. late 47s-early 40s  ? ? ?Vitals:  ? 07/22/21 0934  ?BP: 110/70  ?Pulse: 70  ?SpO2: 99%  ?Weight: 97.2 kg (214 lb 3.2 oz)  ? ? ?PHYSICAL EXAM: ?General:  Well appearing. Looks younger than stated age No respiratory difficulty ?HEENT: normal ?Neck: supple. no JVD. Carotids 2+ bilat; no bruits. No lymphadenopathy or thryomegaly appreciated. ?Cor: PMI nondisplaced. Regular rate & rhythm. No rubs, gallops or murmurs. ?Lungs: clear ?Abdomen: soft, nontender, nondistended. No hepatosplenomegaly. No bruits or masses. Good bowel sounds. ?Extremities: no cyanosis, clubbing, rash, edema ?Neuro: alert & oriented x 3, cranial nerves grossly intact. moves all 4 extremities w/o difficulty. Affect pleasant. ? ?ECG: NSR 68 + LVH 2 PVC Personally reviewed ? ? ? ?ASSESSMENT & PLAN: ? ?1. Right Breast Cancer ?- Diagnosed 7/22. ?- R mastectomy in 8/22. Stage IIa ER+, HER2 +, PR-   ?- Started therapy with Herceptin q21 days in 9/22 ? ?2. Chemo-induced cardiotoxicty ?- Echo 2/23 45-50% GLS -14.5 )I felt 50-55%) ?- Echo 11/22 EF 50-55% GLS -20.7  ?- Echo 8/22 55-60% GLS -21.9 ?- Echos evaluated personally and I suspect he may have mild Herceptin cardiotoxicity but EF still in low normal range ?- Explained incidence, natural history and therapeutic approach to Herceptin cardiotoxicity including role of schreduled treatment interruptions ?- Will continue Heceptin for 2 more doses ?- Start carvedilol 3.125 bid ?- Continue lisinopril  ?- Repeat echo in 6-8 (after next 2 dose of Herceptin but prior to 3rd  dose) ?- Place Zio to quantify PVC burden ? ?3. HTN  ?- Blood pressure well controlled. Continue current regimen. ? ?4. PVCs on ECG ?- will place Zio to quantify (if > 10% may be contributing to CM) ?- wife says he snores some. May need sleep study  ? ?Glori Bickers, MD  ?10:03 AM ? ? ? ?

## 2021-07-22 ENCOUNTER — Encounter (HOSPITAL_COMMUNITY): Payer: Self-pay | Admitting: Internal Medicine

## 2021-07-22 ENCOUNTER — Other Ambulatory Visit (HOSPITAL_COMMUNITY): Payer: Self-pay | Admitting: Internal Medicine

## 2021-07-22 ENCOUNTER — Ambulatory Visit (HOSPITAL_COMMUNITY)
Admission: RE | Admit: 2021-07-22 | Discharge: 2021-07-22 | Disposition: A | Discharge status: Home / Self Care | Payer: Medicare Other | Source: Ambulatory Visit | Attending: Internal Medicine | Admitting: Internal Medicine

## 2021-07-22 ENCOUNTER — Other Ambulatory Visit: Payer: Self-pay

## 2021-07-22 VITALS — BP 110/70 | HR 70 | Wt 214.2 lb

## 2021-07-22 DIAGNOSIS — N1831 Chronic kidney disease, stage 3a: Secondary | ICD-10-CM | POA: Insufficient documentation

## 2021-07-22 DIAGNOSIS — Z9221 Personal history of antineoplastic chemotherapy: Secondary | ICD-10-CM | POA: Diagnosis not present

## 2021-07-22 DIAGNOSIS — Z853 Personal history of malignant neoplasm of breast: Secondary | ICD-10-CM | POA: Diagnosis present

## 2021-07-22 DIAGNOSIS — I493 Ventricular premature depolarization: Secondary | ICD-10-CM | POA: Diagnosis not present

## 2021-07-22 DIAGNOSIS — C50121 Malignant neoplasm of central portion of right male breast: Secondary | ICD-10-CM

## 2021-07-22 DIAGNOSIS — Z79899 Other long term (current) drug therapy: Secondary | ICD-10-CM | POA: Insufficient documentation

## 2021-07-22 DIAGNOSIS — R0683 Snoring: Secondary | ICD-10-CM | POA: Diagnosis not present

## 2021-07-22 DIAGNOSIS — I10 Essential (primary) hypertension: Secondary | ICD-10-CM

## 2021-07-22 DIAGNOSIS — T451X5A Adverse effect of antineoplastic and immunosuppressive drugs, initial encounter: Secondary | ICD-10-CM | POA: Diagnosis not present

## 2021-07-22 DIAGNOSIS — I129 Hypertensive chronic kidney disease with stage 1 through stage 4 chronic kidney disease, or unspecified chronic kidney disease: Secondary | ICD-10-CM | POA: Diagnosis not present

## 2021-07-22 DIAGNOSIS — Z85038 Personal history of other malignant neoplasm of large intestine: Secondary | ICD-10-CM | POA: Insufficient documentation

## 2021-07-22 DIAGNOSIS — Z170 Estrogen receptor positive status [ER+]: Secondary | ICD-10-CM

## 2021-07-22 DIAGNOSIS — Z9011 Acquired absence of right breast and nipple: Secondary | ICD-10-CM | POA: Diagnosis not present

## 2021-07-22 DIAGNOSIS — R002 Palpitations: Secondary | ICD-10-CM

## 2021-07-22 DIAGNOSIS — I427 Cardiomyopathy due to drug and external agent: Secondary | ICD-10-CM

## 2021-07-22 DIAGNOSIS — Z08 Encounter for follow-up examination after completed treatment for malignant neoplasm: Secondary | ICD-10-CM | POA: Diagnosis not present

## 2021-07-22 MED ORDER — CARVEDILOL 3.125 MG PO TABS: 3.1250 mg | ORAL_TABLET | Freq: Two times a day (BID) | ORAL | 3 refills | Status: DC

## 2021-07-22 NOTE — Addendum Note (Signed)
Encounter addended by: Scarlette Calico, RN on: 07/22/2021 10:24 AM ? Actions taken: Pharmacy for encounter modified, Order list changed, Diagnosis association updated, Clinical Note Signed

## 2021-07-22 NOTE — Patient Instructions (Signed)
Medication Changes: ? ?Start Carvedilol 3.125 mg Twice daily  ? ?Lab Work: ? ?None ? ?Testing/Procedures: ? ?Your provider has recommended that  you wear a Zio Patch for 14 days.  This monitor will record your heart rhythm for our review.  IF you have any symptoms while wearing the monitor please press the button.  If you have any issues with the patch or you notice a red or orange light on it please call the company at [phone].  Once you remove the patch please mail it back to the company as soon as possible so we can get the results. ? ?Your physician has requested that you have an echocardiogram. Echocardiography is a painless test that uses sound waves to create images of your heart. It provides your doctor with information about the size and shape of your heart and how well your heart?s chambers and valves are working. This procedure takes approximately one hour. There are no restrictions for this procedure. IN 1 MONTH WITH FOLLOW-UP ? ?Referrals: ? ?None ? ?Special Instructions // Education: ? ?None ? ?Follow-Up in: 1 month with echocardiogram ? ?At the [facility], you and your health needs are our priority. We have a designated team specialized in the treatment of Heart Failure. This Care Team includes your primary Heart Failure Specialized Cardiologist (physician), Advanced Practice Providers (APPs- Physician Assistants and Nurse Practitioners), and Pharmacist who all work together to provide you with the care you need, when you need it.  ? ?You may see any of the following providers on your designated Care Team at your next follow up: ? ?Dr Glori Bickers ?Dr Loralie Champagne ?Darrick Grinder, NP ?Lyda Jester, PA ?Jessica Milford,NP ?Marlyce Huge, PA ?Audry Riles, PharmD ? ? ?Please be sure to bring in all your medications bottles to every appointment.  ? ?Need to Contact us: ? ?If you have any questions or concerns before your next appointment please send [place] a message through  [place] or call our office at [phone].   ? ?TO LEAVE A MESSAGE FOR THE NURSE SELECT OPTION 2, PLEASE LEAVE A MESSAGE INCLUDING: ?YOUR NAME ?DATE OF BIRTH ?CALL BACK NUMBER ?REASON FOR CALL**this is important as we prioritize the call backs ? ?YOU WILL RECEIVE A CALL BACK THE SAME DAY AS LONG AS YOU CALL BEFORE 4:00 PM ? ? ?

## 2021-07-25 ENCOUNTER — Encounter: Payer: Self-pay | Admitting: Oncology

## 2021-07-28 NOTE — Progress Notes (Signed)
? ?Patient Care Team: ?Orpah Melter, MD as PCP - General (Family Medicine) ?[name], MD as Consulting Physician (General Surgery) ?Rockwell Germany, RN as Oncology Nurse Navigator ?Mauro Kaufmann, RN as Oncology Nurse Navigator ?Gery Pray, MD as Consulting Physician (Radiation Oncology) ? ?DIAGNOSIS:  ?Encounter Diagnosis  ?Name Primary?  ? 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 ? 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  ?12/15/2020 Cancer Staging  ? Staging form: Breast, AJCC 8th Edition ?- Clinical: Stage IIIB (cT4, cN0, cM0, G2, ER+, PR-, HER2+) - Signed by Chauncey Cruel, MD on 12/15/2020 ?Histologic grading system: 3 grade system ? ?  ?12/30/2020 Surgery  ? Right mastectomy 12/30/2020 for a pT2, pN0, stage IIA invasive ductal carcinoma, grade 2, with negative margins 0/1 lymph node negative ?  ?01/22/2021 -  Chemotherapy  ? Patient is on Treatment Plan : BREAST Trastuzumab q21d  ?   ?02/13/2021 Genetic Testing  ? Negative hereditary cancer genetic testing: no pathogenic variants detected in [facility] CancerNext-Expanded +RNAinsight Panel.  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  The report date is February 13, 2021.  ? ?The CancerNext-Expanded gene panel offered by [facility] and includes sequencing, rearrangement, and RNA analysis for the following 77 genes: AIP, ALK, APC, ATM, AXIN2, BAP1, BARD1, BLM, BMPR1A, BRCA1, BRCA2, BRIP1, CDC73, CDH1, CDK4, CDKN1B, CDKN2A, CHEK2, CTNNA1, DICER1, FANCC, FH, FLCN, GALNT12, KIF1B, LZTR1, MAX, MEN1, MET, MLH1, MSH2, MSH3, MSH6, MUTYH, NBN, NF1,  NF2, NTHL1, PALB2, PHOX2B, PMS2, POT1, PRKAR1A, PTCH1, PTEN, RAD51C, RAD51D, RB1, RECQL, RET, SDHA, SDHAF2, SDHB, SDHC, SDHD, SMAD4, SMARCA4, SMARCB1, SMARCE1, STK11, SUFU, TMEM127, TP53, TSC1, TSC2, VHL and XRCC2 (sequencing and deletion/duplication); EGFR, EGLN1, HOXB13, KIT, MITF, PDGFRA, POLD1, and POLE (sequencing only); EPCAM and GREM1 (deletion/duplication only).  ?  ?03/09/2021 -  Anti-estrogen oral therapy  ? tamoxifen started 03/09/2021 ?  ? ? ?CHIEF COMPLIANT: Follow-up of estrogen receptor and Her2 positive breast cancer ? ?INTERVAL HISTORY: Terrance Powell is a 87 y.o. with above-mentioned history of estrogen receptor and Her2 positive breast cancer. She presents to the clinic today for follow-up.  He has been tolerating Herceptin extremely well.  His treatment was held briefly to make sure his heart function can be checked by Dr. Haroldine Laws.  After meeting with Dr. Haroldine Laws [place].  His medications have been adjusted. ? ?ALLERGIES:  is allergic to penicillins. ? ?MEDICATIONS:  ?Current Outpatient Medications  ?Medication Sig Dispense Refill  ? bumetanide (BUMEX) 0.5 MG tablet Take 0.5 mg by mouth daily.    ? carvedilol (COREG) 3.125 MG tablet Take 1 tablet (3.125 mg total) by mouth 2 (two) times daily. 60 tablet 3  ? lisinopril (PRINIVIL,ZESTRIL) 10 MG tablet Take 20 mg by mouth daily. Takes 2 tablets    ? tamoxifen (NOLVADEX) 20 MG tablet Take 20 mg by mouth daily.    ? ?No current facility-administered medications for this visit.  ? ? ?PHYSICAL EXAMINATION: ?ECOG PERFORMANCE STATUS: 1 - Symptomatic but completely ambulatory ? ?Vitals:  ? 07/30/21 0853  ?BP: (!) 170/56  ?Pulse: 71  ?Resp: 18  ?Temp: (!) 97.3 ?F (36.3 ?C)  ?  SpO2: 100%  ? ?Filed Weights  ? 07/30/21 0853  ?Weight: 213 lb 4.8 oz (96.8 kg)  ? ?  ? ?LABORATORY DATA:  ?I have reviewed the data as listed ?CMP Latest Ref Rng & Units 06/18/2021 05/28/2021 05/07/2021  ?Glucose 70 - 99 mg/dL 94 99 94  ?BUN 8 - 23 mg/dL _0 ?Creatinine 0.61 - 1.24 mg/dL 1.46(H) 1.39(H) 1.37(H)  ?Sodium 135 - 145 mmol/L 142 140 142  ?Potassium 3.5 - 5.1 mmol/L 3.8 3.9 3.7  ?Chloride 98 - 111 mmol/L 108 109 109  ?CO2 22 - 32 mmol/L _1 ?Calcium 8.9 - 10.3 mg/dL 9.1 9.1 8.9  ?Total Protein 6.5 - 8.1 g/dL 7.3 7.3 6.8  ?Total Bilirubin 0.3 - 1.2 mg/dL 0.5 0.5 0.5  ?Alkaline Phos 38 - 126 U/L 49 54 51  ?AST 15 - 41 U/L _2 ?ALT 0 - 44 U/L _3 ? ? ?Lab Results  ?Component Value Date  ? WBC 8.6 07/30/2021  ? HGB 13.2 07/30/2021  ? HCT 38.7 (L) 07/30/2021  ? MCV 90.4 07/30/2021  ? PLT 278 07/30/2021  ? NEUTROABS 5.1 07/30/2021  ? ? 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  ?2018: History of stage I colon cancer ?  ?12/30/2020:Right mastectomy 12/30/2020 for a pT2, pN0, stage IIA invasive ductal carcinoma, grade 2, with negative margins 0/1 lymph node negative ?  ?Current treatment: Trastuzumab started 01/22/2021, tamoxifen started 03/09/2021 ?Toxicities: Tolerating the treatment extremely well  Denies any hot flashes or arthralgias or myalgias. ? ?Echocardiogram 04/07/2021: EF 50 to 55% ?Echocardiogram 07/02/2021: EF 45 to 50% (okay to treat as per cardiology guidance) ?He has a cruise in July and would like to make sure that we do not schedule his treatment during that time. ? ?Return to clinic every 3 weeks for trastuzumab and every 6 weeks for follow-up with me. ? ? ? ?No orders of the defined types were placed in this encounter. ? ?The patient has a good understanding of the overall plan. he agrees with it. he will call with any problems that may develop before the next visit here. ?Total time spent: 30 mins including face to face time and time spent for planning, charting  and co-ordination of care ? ? Harriette Ohara, MD ?07/30/21 ? ? ? I, Gardiner Coins, am acting as a scribe for Dr. Lindi Adie  ?

## 2021-07-29 ENCOUNTER — Other Ambulatory Visit: Payer: Self-pay | Admitting: *Deleted

## 2021-07-29 ENCOUNTER — Other Ambulatory Visit: Payer: Self-pay | Admitting: Hematology and Oncology

## 2021-07-29 DIAGNOSIS — C50121 Malignant neoplasm of central portion of right male breast: Secondary | ICD-10-CM

## 2021-07-30 ENCOUNTER — Inpatient Hospital Stay: Payer: Medicare Other | Attending: Hematology and Oncology

## 2021-07-30 ENCOUNTER — Inpatient Hospital Stay (HOSPITAL_BASED_OUTPATIENT_CLINIC_OR_DEPARTMENT_OTHER): Payer: Medicare Other | Admitting: Hematology and Oncology

## 2021-07-30 ENCOUNTER — Inpatient Hospital Stay: Payer: Medicare Other

## 2021-07-30 ENCOUNTER — Other Ambulatory Visit: Payer: Self-pay

## 2021-07-30 DIAGNOSIS — C50121 Malignant neoplasm of central portion of right male breast: Secondary | ICD-10-CM | POA: Diagnosis not present

## 2021-07-30 DIAGNOSIS — Z5112 Encounter for antineoplastic immunotherapy: Secondary | ICD-10-CM | POA: Insufficient documentation

## 2021-07-30 DIAGNOSIS — Z170 Estrogen receptor positive status [ER+]: Secondary | ICD-10-CM | POA: Diagnosis not present

## 2021-07-30 LAB — CMP (CANCER CENTER ONLY)
ALT: 12 U/L (ref 0–44)
AST: 16 U/L (ref 15–41)
Albumin: 4 g/dL (ref 3.5–5.0)
Alkaline Phosphatase: 47 U/L (ref 38–126)
Anion gap: 5 (ref 5–15)
BUN: 23 mg/dL (ref 8–23)
CO2: 28 mmol/L (ref 22–32)
Calcium: 9 mg/dL (ref 8.9–10.3)
Chloride: 107 mmol/L (ref 98–111)
Creatinine: 1.52 mg/dL — ABNORMAL HIGH (ref 0.61–1.24)
GFR, Estimated: 45 mL/min — ABNORMAL LOW (ref >60)
Glucose, Bld: 87 mg/dL (ref 70–99)
Potassium: 3.6 mmol/L (ref 3.5–5.1)
Sodium: 140 mmol/L (ref 135–145)
Total Bilirubin: 0.7 mg/dL (ref 0.3–1.2)
Total Protein: 6.9 g/dL (ref 6.5–8.1)

## 2021-07-30 LAB — CBC WITH DIFFERENTIAL (CANCER CENTER ONLY)
Abs Immature Granulocytes: 0.03 10*3/uL (ref 0.00–0.07)
Basophils Absolute: 0.1 10*3/uL (ref 0.0–0.1)
Basophils Relative: 1 %
Eosinophils Absolute: 0.4 10*3/uL (ref 0.0–0.5)
Eosinophils Relative: 4 %
HCT: 38.7 % — ABNORMAL LOW (ref 39.0–52.0)
Hemoglobin: 13.2 g/dL (ref 13.0–17.0)
Immature Granulocytes: 0 %
Lymphocytes Relative: 27 %
Lymphs Abs: 2.3 10*3/uL (ref 0.7–4.0)
MCH: 30.8 pg (ref 26.0–34.0)
MCHC: 34.1 g/dL (ref 30.0–36.0)
MCV: 90.4 fL (ref 80.0–100.0)
Monocytes Absolute: 0.6 10*3/uL (ref 0.1–1.0)
Monocytes Relative: 7 %
Neutro Abs: 5.1 10*3/uL (ref 1.7–7.7)
Neutrophils Relative %: 61 %
Platelet Count: 278 10*3/uL (ref 150–400)
RBC: 4.28 MIL/uL (ref 4.22–5.81)
RDW: 13.6 % (ref 11.5–15.5)
WBC Count: 8.6 10*3/uL (ref 4.0–10.5)
nRBC: 0 % (ref 0.0–0.2)

## 2021-07-30 MED ORDER — ACETAMINOPHEN 325 MG PO TABS
650.0000 mg | ORAL_TABLET | Freq: Once | ORAL | Status: AC
  Administered 2021-07-30: 650 mg via ORAL
  Filled 2021-07-30: qty 2

## 2021-07-30 MED ORDER — SODIUM CHLORIDE 0.9 % IV SOLN: Freq: Once | INTRAVENOUS | Status: AC

## 2021-07-30 MED ORDER — DIPHENHYDRAMINE HCL 25 MG PO CAPS
50.0000 mg | ORAL_CAPSULE | Freq: Once | ORAL | Status: AC
  Administered 2021-07-30: 50 mg via ORAL
  Filled 2021-07-30: qty 2

## 2021-07-30 MED ORDER — TRASTUZUMAB-ANNS CHEMO 150 MG IV SOLR
6.0000 mg/kg | Freq: Once | INTRAVENOUS | Status: AC
  Administered 2021-07-30: 609 mg via INTRAVENOUS
  Filled 2021-07-30: qty 29

## 2021-07-30 NOTE — Patient Instructions (Addendum)
[facility]COL[facility]  Discharge Instructions: °Thank you for choosing [facility] to provide your oncology and hematology care.  ° °If you have a lab appointment with the Cancer Center, please go directly to the Cancer Center and check in at the registration area. °  °Wear comfortable clothing and clothing appropriate for easy access to any Portacath or PICC line.  ° °We strive to give you quality time with your provider. You may need to reschedule your appointment if you arrive late (15 or more minutes).  Arriving late affects you and other patients whose appointments are after yours.  Also, if you miss three or more appointments without notifying the office, you may be dismissed from the clinic at the provider’s discretion.    °  °For prescription refill requests, have your pharmacy contact our office and allow 72 hours for refills to be completed.   ° °Today you received the following chemotherapy and/or immunotherapy agent: Trastuzumab ([place]i). °  °To help prevent nausea and vomiting after your treatment, we encourage you to take your nausea medication as directed. ° °BELOW ARE SYMPTOMS THAT SHOULD BE REPORTED IMMEDIATELY: °*FEVER GREATER THAN 100.4 F (38 °C) OR HIGHER °*CHILLS OR SWEATING °*NAUSEA AND VOMITING THAT IS NOT CONTROLLED WITH YOUR NAUSEA MEDICATION °*UNUSUAL SHORTNESS OF BREATH °*UNUSUAL BRUISING OR BLEEDING °*URINARY PROBLEMS (pain or burning when urinating, or frequent urination) °*BOWEL PROBLEMS (unusual diarrhea, constipation, pain near the anus) °TENDERNESS IN MOUTH AND THROAT WITH OR WITHOUT PRESENCE OF ULCERS (sore throat, sores in mouth, or a toothache) °UNUSUAL RASH, SWELLING OR PAIN  °UNUSUAL VAGINAL DISCHARGE OR ITCHING  ° °Items with * indicate a potential emergency and should be followed up as soon as possible or go to the Emergency Department if any problems should occur. ° °Please show the CHEMOTHERAPY ALERT CARD or IMMUNOTHERAPY ALERT CARD at  check-in to the Emergency Department and triage nurse. ° °Should you have questions after your visit or need to cancel or reschedule your appointment, please contact [facility]  Dept: [phone]  and follow the prompts.  Office hours are 8:00 a.m. to 4:30 p.m. Monday - Friday. Please note that voicemails left after 4:00 p.m. may not be returned until the following business day.  We are closed weekends and major holidays. You have access to a nurse at all times for urgent questions. Please call the main number to the clinic Dept: [phone] and follow the prompts. ° ° °For any non-urgent questions, you may also contact your provider using MyChart. We now offer e-Visits for anyone 18 and older to request care online for non-urgent symptoms. For details visit [email or web address]. °  °Also download the MyChart app! Go to the app store, search "MyChart", open the app, select [facility], and log in with your MyChart username and password. ° °Due to Covid, a mask is required upon entering the hospital/clinic. If you do not have a mask, one will be given to you upon arrival. For doctor visits, patients may have 1 support person aged 18 or older with them. For treatment visits, patients cannot have anyone with them due to current Covid guidelines and our immunocompromised population.  ° °

## 2021-07-30 NOTE — Assessment & Plan Note (Signed)
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 ?2018: History of stage I colon cancer ?? ?12/30/2020:Right mastectomy 12/30/2020 for a pT2, pN0, stage IIA?invasive ductal carcinoma, grade 2, with negative margins 0/1 lymph node negative ?? ?Current treatment: Trastuzumab started 01/22/2021, tamoxifen started 03/09/2021 ?Toxicities: Tolerating the treatment extremely well without any problems or concerns.  Denies any hot flashes or arthralgias or myalgias. ?? ?Return to clinic every 3 weeks for trastuzumab and every 6 weeks for follow-up with me. ?

## 2021-08-20 ENCOUNTER — Inpatient Hospital Stay: Payer: Medicare Other | Attending: Oncology

## 2021-08-20 ENCOUNTER — Other Ambulatory Visit: Payer: Self-pay

## 2021-08-20 ENCOUNTER — Encounter: Payer: Self-pay | Admitting: *Deleted

## 2021-08-20 ENCOUNTER — Other Ambulatory Visit: Payer: Medicare Other

## 2021-08-20 VITALS — BP 135/82 | HR 56 | Temp 98.4°F | Resp 18 | Wt 214.8 lb

## 2021-08-20 DIAGNOSIS — C50121 Malignant neoplasm of central portion of right male breast: Secondary | ICD-10-CM | POA: Insufficient documentation

## 2021-08-20 DIAGNOSIS — Z5112 Encounter for antineoplastic immunotherapy: Secondary | ICD-10-CM | POA: Diagnosis present

## 2021-08-20 DIAGNOSIS — Z170 Estrogen receptor positive status [ER+]: Secondary | ICD-10-CM

## 2021-08-20 MED ORDER — TRASTUZUMAB-ANNS CHEMO 150 MG IV SOLR
6.0000 mg/kg | Freq: Once | INTRAVENOUS | Status: AC
  Administered 2021-08-20: 609 mg via INTRAVENOUS
  Filled 2021-08-20: qty 29

## 2021-08-20 MED ORDER — SODIUM CHLORIDE 0.9 % IV SOLN: Freq: Once | INTRAVENOUS | Status: AC

## 2021-08-20 MED ORDER — DIPHENHYDRAMINE HCL 25 MG PO CAPS
50.0000 mg | ORAL_CAPSULE | Freq: Once | ORAL | Status: AC
  Administered 2021-08-20: 50 mg via ORAL
  Filled 2021-08-20: qty 2

## 2021-08-20 MED ORDER — ACETAMINOPHEN 325 MG PO TABS
650.0000 mg | ORAL_TABLET | Freq: Once | ORAL | Status: AC
  Administered 2021-08-20: 650 mg via ORAL
  Filled 2021-08-20: qty 2

## 2021-08-20 NOTE — Progress Notes (Signed)
Per Dr.Gudena ok to proceed with treatment with current echo.  ?

## 2021-08-20 NOTE — Patient Instructions (Signed)
[facility]  Discharge Instructions: Thank you for choosing [facility] to provide your oncology and hematology care.   If you have a lab appointment with the Cancer Center, please go directly to the Cancer Center and check in at the registration area.   Wear comfortable clothing and clothing appropriate for easy access to any Portacath or PICC line.   We strive to give you quality time with your provider. You may need to reschedule your appointment if you arrive late (15 or more minutes).  Arriving late affects you and other patients whose appointments are after yours.  Also, if you miss three or more appointments without notifying the office, you may be dismissed from the clinic at the provider's discretion.      For prescription refill requests, have your pharmacy contact our office and allow 72 hours for refills to be completed.    Today you received the following chemotherapy and/or immunotherapy agents: trastuzumab-anns (Herceptin)      To help prevent nausea and vomiting after your treatment, we encourage you to take your nausea medication as directed.  BELOW ARE SYMPTOMS THAT SHOULD BE REPORTED IMMEDIATELY: *FEVER GREATER THAN 100.4 F (38 C) OR HIGHER *CHILLS OR SWEATING *NAUSEA AND VOMITING THAT IS NOT CONTROLLED WITH YOUR NAUSEA MEDICATION *UNUSUAL SHORTNESS OF BREATH *UNUSUAL BRUISING OR BLEEDING *URINARY PROBLEMS (pain or burning when urinating, or frequent urination) *BOWEL PROBLEMS (unusual diarrhea, constipation, pain near the anus) TENDERNESS IN MOUTH AND THROAT WITH OR WITHOUT PRESENCE OF ULCERS (sore throat, sores in mouth, or a toothache) UNUSUAL RASH, SWELLING OR PAIN  UNUSUAL VAGINAL DISCHARGE OR ITCHING   Items with * indicate a potential emergency and should be followed up as soon as possible or go to the Emergency Department if any problems should occur.  Please show the CHEMOTHERAPY ALERT CARD or IMMUNOTHERAPY ALERT  CARD at check-in to the Emergency Department and triage nurse.  Should you have questions after your visit or need to cancel or reschedule your appointment, please contact [facility]  Dept: [phone]  and follow the prompts.  Office hours are 8:00 a.m. to 4:30 p.m. Monday - Friday. Please note that voicemails left after 4:00 p.m. may not be returned until the following business day.  We are closed weekends and major holidays. You have access to a nurse at all times for urgent questions. Please call the main number to the clinic Dept: [phone] and follow the prompts.   For any non-urgent questions, you may also contact your provider using MyChart. We now offer e-Visits for anyone 18 and older to request care online for non-urgent symptoms. For details visit [email or web address].   Also download the MyChart app! Go to the app store, search "MyChart", open the app, select , and log in with your MyChart username and password.  Due to Covid, a mask is required upon entering the hospital/clinic. If you do not have a mask, one will be given to you upon arrival. For doctor visits, patients may have 1 support person aged 18 or older with them. For treatment visits, patients cannot have anyone with them due to current Covid guidelines and our immunocompromised population.  

## 2021-08-27 NOTE — Progress Notes (Incomplete)
? ?Patient Care Team: ?Orpah Melter, MD as PCP - General (Family Medicine) ?[name], MD as Consulting Physician (General Surgery) ?Rockwell Germany, RN as Oncology Nurse Navigator ?Mauro Kaufmann, RN as Oncology Nurse Navigator ?Gery Pray, MD as Consulting Physician (Radiation Oncology) ? ?DIAGNOSIS: No diagnosis found. ? 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  ?12/15/2020 Cancer Staging  ? Staging form: Breast, AJCC 8th Edition ?- Clinical: Stage IIIB (cT4, cN0, cM0, G2, ER+, PR-, HER2+) - Signed by Chauncey Cruel, MD on 12/15/2020 ?Histologic grading system: 3 grade system ? ?  ?12/30/2020 Surgery  ? Right mastectomy 12/30/2020 for a pT2, pN0, stage IIA invasive ductal carcinoma, grade 2, with negative margins 0/1 lymph node negative ?  ?01/22/2021 -  Chemotherapy  ? Patient is on Treatment Plan : BREAST Trastuzumab q21d  ?   ?02/13/2021 Genetic Testing  ? Negative hereditary cancer genetic testing: no pathogenic variants detected in [facility] CancerNext-Expanded +RNAinsight Panel.  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  The report date is February 13, 2021.  ? ?The CancerNext-Expanded gene panel offered by [facility] and includes sequencing, rearrangement, and RNA analysis for the following 77 genes: AIP, ALK, APC, ATM, AXIN2, BAP1, BARD1, BLM, BMPR1A, BRCA1, BRCA2, BRIP1, CDC73, CDH1, CDK4, CDKN1B, CDKN2A, CHEK2, CTNNA1, DICER1, FANCC, FH, FLCN, GALNT12, KIF1B, LZTR1, MAX, MEN1, MET, MLH1, MSH2, MSH3, MSH6, MUTYH, NBN, NF1, NF2, NTHL1, PALB2, PHOX2B, PMS2, POT1, PRKAR1A, PTCH1, PTEN, RAD51C, RAD51D, RB1, RECQL, RET, SDHA, SDHAF2, SDHB, SDHC,  SDHD, SMAD4, SMARCA4, SMARCB1, SMARCE1, STK11, SUFU, TMEM127, TP53, TSC1, TSC2, VHL and XRCC2 (sequencing and deletion/duplication); EGFR, EGLN1, HOXB13, KIT, MITF, PDGFRA, POLD1, and POLE (sequencing only); EPCAM and GREM1 (deletion/duplication only).  ?  ?03/09/2021 -  Anti-estrogen oral therapy  ? tamoxifen started 03/09/2021 ?  ? ? ?CHIEF COMPLIANT: Follow-up of estrogen receptor and Her2 positive breast cancer ? ?INTERVAL HISTORY: Terrance Powell is a 87 y.o. with above-mentioned history of estrogen receptor and Her2 positive breast cancer currently on trastuzumab. She presents to the clinic today for follow-up. ? ? ?ALLERGIES:  is allergic to penicillins. ? ?MEDICATIONS:  ?Current Outpatient Medications  ?Medication Sig Dispense Refill  ? bumetanide (BUMEX) 0.5 MG tablet Take 0.5 mg by mouth daily.    ? carvedilol (COREG) 3.125 MG tablet Take 1 tablet (3.125 mg total) by mouth 2 (two) times daily. 60 tablet 3  ? lisinopril (PRINIVIL,ZESTRIL) 10 MG tablet Take 20 mg by mouth daily. Takes 2 tablets    ? tamoxifen (NOLVADEX) 20 MG tablet Take 20 mg by mouth daily.    ? ?No current facility-administered medications for this visit.  ? ? ?PHYSICAL EXAMINATION: ?ECOG PERFORMANCE STATUS: {CHL ONC ECOG XB:[id number]} ? ?There were no vitals filed for this visit. ?There were no vitals filed for this visit. ? ?BREAST:*** No palpable masses or nodules in either right or left breasts. No palpable axillary supraclavicular or infraclavicular adenopathy no breast tenderness or nipple discharge. (exam performed in the presence of a chaperone) ? ?LABORATORY DATA:  ?I have reviewed the data as listed ? ?  Latest Ref Rng & Units 07/30/2021  ?  8:44 AM 06/18/2021  ?  8:38 AM 05/28/2021  ?  8:43  AM  ?CMP  ?Glucose 70 - 99 mg/dL 87   94   99    ?BUN 8 - 23 mg/dL 23   22   21     ?Creatinine 0.61 - 1.24 mg/dL 1.52   1.46   1.39    ?Sodium 135 - 145 mmol/L 140   142   140    ?Potassium 3.5 - 5.1 mmol/L 3.6   3.8   3.9    ?Chloride 98 - 111  mmol/L 107   108   109    ?CO2 22 - 32 mmol/L 28   28   23     ?Calcium 8.9 - 10.3 mg/dL 9.0   9.1   9.1    ?Total Protein 6.5 - 8.1 g/dL 6.9   7.3   7.3    ?Total Bilirubin 0.3 - 1.2 mg/dL 0.7   0.5   0.5    ?Alkaline Phos 38 - 126 U/L 47   49   54    ?AST 15 - 41 U/L 16   17   17     ?ALT 0 - 44 U/L 12   17   17     ? ? ?Lab Results  ?Component Value Date  ? WBC 8.6 07/30/2021  ? HGB 13.2 07/30/2021  ? HCT 38.7 (L) 07/30/2021  ? MCV 90.4 07/30/2021  ? PLT 278 07/30/2021  ? NEUTROABS 5.1 07/30/2021  ? ? ?ASSESSMENT & PLAN:  ?No problem-specific Assessment & Plan notes found for this encounter. ? ? ? ?No orders of the defined types were placed in this encounter. ? ?The patient has a good understanding of the overall plan. he agrees with it. he will call with any problems that may develop before the next visit here. ?Total time spent: 30 mins including face to face time and time spent for planning, charting and co-ordination of care ? ? Suzzette Righter, CMA ?08/27/21 ? ? ? I Gardiner Coins am scribing for Dr. Lindi Adie ? ?***  ?

## 2021-09-03 ENCOUNTER — Encounter (HOSPITAL_COMMUNITY): Payer: Self-pay | Admitting: Internal Medicine

## 2021-09-03 ENCOUNTER — Encounter: Payer: Self-pay | Admitting: *Deleted

## 2021-09-03 ENCOUNTER — Encounter: Payer: Self-pay | Admitting: Oncology

## 2021-09-03 ENCOUNTER — Ambulatory Visit (HOSPITAL_COMMUNITY)
Admission: RE | Admit: 2021-09-03 | Discharge: 2021-09-03 | Disposition: A | Discharge status: Home / Self Care | Payer: Medicare Other | Source: Ambulatory Visit | Attending: Internal Medicine | Admitting: Internal Medicine

## 2021-09-03 ENCOUNTER — Ambulatory Visit (HOSPITAL_BASED_OUTPATIENT_CLINIC_OR_DEPARTMENT_OTHER)
Admission: RE | Admit: 2021-09-03 | Discharge: 2021-09-03 | Disposition: A | Discharge status: Home / Self Care | Payer: Medicare Other | Source: Ambulatory Visit | Attending: Internal Medicine | Admitting: Internal Medicine

## 2021-09-03 ENCOUNTER — Other Ambulatory Visit (HOSPITAL_COMMUNITY): Payer: Self-pay

## 2021-09-03 ENCOUNTER — Other Ambulatory Visit: Payer: Self-pay | Admitting: Pharmacist

## 2021-09-03 VITALS — BP 132/80 | HR 42 | Wt 214.6 lb

## 2021-09-03 DIAGNOSIS — Z9221 Personal history of antineoplastic chemotherapy: Secondary | ICD-10-CM | POA: Diagnosis not present

## 2021-09-03 DIAGNOSIS — C50121 Malignant neoplasm of central portion of right male breast: Secondary | ICD-10-CM | POA: Diagnosis not present

## 2021-09-03 DIAGNOSIS — I5189 Other ill-defined heart diseases: Secondary | ICD-10-CM | POA: Diagnosis not present

## 2021-09-03 DIAGNOSIS — I517 Cardiomegaly: Secondary | ICD-10-CM | POA: Diagnosis not present

## 2021-09-03 DIAGNOSIS — Z01818 Encounter for other preprocedural examination: Secondary | ICD-10-CM | POA: Diagnosis present

## 2021-09-03 DIAGNOSIS — Z170 Estrogen receptor positive status [ER+]: Secondary | ICD-10-CM | POA: Insufficient documentation

## 2021-09-03 DIAGNOSIS — I119 Hypertensive heart disease without heart failure: Secondary | ICD-10-CM | POA: Diagnosis not present

## 2021-09-03 DIAGNOSIS — I700 Atherosclerosis of aorta: Secondary | ICD-10-CM | POA: Insufficient documentation

## 2021-09-03 DIAGNOSIS — I427 Cardiomyopathy due to drug and external agent: Secondary | ICD-10-CM

## 2021-09-03 DIAGNOSIS — I080 Rheumatic disorders of both mitral and aortic valves: Secondary | ICD-10-CM | POA: Diagnosis not present

## 2021-09-03 DIAGNOSIS — Z0189 Encounter for other specified special examinations: Secondary | ICD-10-CM | POA: Diagnosis not present

## 2021-09-03 DIAGNOSIS — I429 Cardiomyopathy, unspecified: Secondary | ICD-10-CM | POA: Diagnosis not present

## 2021-09-03 DIAGNOSIS — I493 Ventricular premature depolarization: Secondary | ICD-10-CM | POA: Insufficient documentation

## 2021-09-03 DIAGNOSIS — T451X5A Adverse effect of antineoplastic and immunosuppressive drugs, initial encounter: Secondary | ICD-10-CM

## 2021-09-03 MED ORDER — EMPAGLIFLOZIN 10 MG PO TABS: 10.0000 mg | ORAL_TABLET | Freq: Every day | ORAL | 11 refills | Status: DC

## 2021-09-03 MED ORDER — BUMETANIDE 0.5 MG PO TABS: 0.5000 mg | ORAL_TABLET | ORAL | Status: DC | PRN

## 2021-09-03 MED ORDER — CARVEDILOL 3.125 MG PO TABS: 3.1250 mg | ORAL_TABLET | Freq: Two times a day (BID) | ORAL | Status: DC

## 2021-09-03 MED ORDER — CARVEDILOL 6.25 MG PO TABS: 6.2500 mg | ORAL_TABLET | Freq: Two times a day (BID) | ORAL | 11 refills | Status: DC

## 2021-09-03 NOTE — Progress Notes (Signed)
Per MD pt needing to hold next two upcoming tx due to decreased in EF.  Appts canceled and message sent to scheduling for f/u on 11/02/21. ?

## 2021-09-03 NOTE — Patient Instructions (Signed)
Medication Changes: ? ?Change Bumex to as needed for weight gain of 3lb in 24 hours or 5lb in a week, and swelling ? ?Lab Work: ? ?none ? ?Testing/Procedures: ? ?Your physician has requested that you have an echocardiogram. Echocardiography is a painless test that uses sound waves to create images of your heart. It provides your doctor with information about the size and shape of your heart and how well your heart?s chambers and valves are working. This procedure takes approximately one hour. There are no restrictions for this procedure. ? ?Your physician has recommended that you have a sleep study. This test records several body functions during sleep, including: brain activity, eye movement, oxygen and carbon dioxide blood levels, heart rate and rhythm, breathing rate and rhythm, the flow of air through your mouth and nose, snoring, body muscle movements, and chest and belly movement. ? ? ?Referrals: ? ?none ? ?Special Instructions // Education: ? ?none ? ?Follow-Up in: 6 weeks with an echocardiogram  ? ?At the [facility], you and your health needs are our priority. We have a designated team specialized in the treatment of Heart Failure. This Care Team includes your primary Heart Failure Specialized Cardiologist (physician), Advanced Practice Providers (APPs- Physician Assistants and Nurse Practitioners), and Pharmacist who all work together to provide you with the care you need, when you need it.  ? ?You may see any of the following providers on your designated Care Team at your next follow up: ? ?Dr Glori Bickers ?Dr Loralie Champagne ?Darrick Grinder, NP ?Lyda Jester, PA ?Jessica Milford,NP ?Marlyce Huge, PA ?Audry Riles, PharmD ? ? ?Please be sure to bring in all your medications bottles to every appointment.  ? ?Need to Contact us: ? ?If you have any questions or concerns before your next appointment please send [place] a message through [place] or call our office at [phone].   ? ?TO LEAVE  A MESSAGE FOR THE NURSE SELECT OPTION 2, PLEASE LEAVE A MESSAGE INCLUDING: ?YOUR NAME ?DATE OF BIRTH ?CALL BACK NUMBER ?REASON FOR CALL**this is important as we prioritize the call backs ? ?YOU WILL RECEIVE A CALL BACK THE SAME DAY AS LONG AS YOU CALL BEFORE 4:00 PM ? ? ?

## 2021-09-03 NOTE — Progress Notes (Signed)
? ?CARDIO-ONCOLOGY CLINIC NOTE ? ?Referring Physician: Dr. Lindi Adie ?Primary Care: Orpah Melter, MD ?Primary Cardiologist: None ? ? ?HPI:  Terrance Powell is an  87 y.o. male with HTN,  CKD 3a, previous colon CA and right breast cancer referred by Dr. Lindi Adie for enrollment into the Cardio-Oncology program due to possible Herceptin cardiotoxicity.  ? ?Diagnosed with R breast cancer in 7/22. Had R mastectomy in 8/22. Stage IIa ER+, HER2 +, PR-  Started therapy with Herceptin q21 days in 9/22 ? ?EF 2/23 EF  45-50% GLS -14.5 (I felt 50-55%) ?Echo 11/22 EF 50-55% GLS -20.7  ?Echo 8/22 55-60% GLS -21.9 ? ?At last visit I suspected he might have early Herceptin toxicity. Herceptin continued. Carvedilol added to lisinopril. Zio done to quantify PVC burden ? ?Feels well. Denies SOB, orthopnea or PND.  ? ?Zio 3.23 6.2% PVCs ? ?Echo today EF ~45% (definitely lower since previous) Personally reviewed ? ? ? ? ?Past Medical History:  ?Diagnosis Date  ? Constipation   ? Family history of breast cancer 02/04/2021  ? Family history of colon cancer 02/04/2021  ? Family history of ovarian cancer 02/04/2021  ? Head injury ~ 1942  ? loss of consciouss  ? History of gout   ? Hypertension   ? 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 ? ?Current Outpatient Medications  ?Medication Sig Dispense Refill  ? bumetanide (BUMEX) 0.5 MG tablet Take 0.5 mg by mouth daily.    ? carvedilol (COREG) 3.125 MG tablet Take 1 tablet (3.125 mg total) by mouth 2 (two) times daily. 60 tablet 3  ? lisinopril (PRINIVIL,ZESTRIL) 10 MG tablet Take 20 mg by mouth daily. Takes 2 tablets    ? tamoxifen (NOLVADEX) 20 MG tablet Take 20 mg by mouth daily.    ? ?No current facility-administered medications for this encounter.  ? ? ?Allergies  ?Allergen Reactions  ? Penicillins Diarrhea, Itching and Other (See Comments)  ?  TESTICULAR PAIN ? ?Has patient had a PCN reaction causing immediate rash, facial/tongue/throat swelling, SOB or lightheadedness with  hypotension: No ?SEVERE RASH INVOLVING MUCUS MEMBRANES or SKIN NECROSIS: #  #  #  YES  #  #  #  ?Has patient had a PCN reaction that required hospitalization No ?Has patient had a PCN reaction occurring within the last 10 years: No. ?  ? ? ?  ?Social History  ? ?Socioeconomic History  ? Marital status: Married  ?  Spouse name: Not on file  ? Number of children: Not on file  ? Years of education: Not on file  ? Highest education level: Not on file  ?Occupational History  ? Not on file  ?Tobacco Use  ? Smoking status: Former  ? Smokeless tobacco: Never  ? Tobacco comments:  ?  "toyed with " none in over 40 years"  ?Vaping Use  ? Vaping Use: Never used  ?Substance and Sexual Activity  ? Alcohol use: Yes  ?  Comment: 09/08/2016 "might have a couple drinks on holidays"  ? Drug use: No  ? Sexual activity: Not Currently  ?Other Topics Concern  ? Not on file  ?Social History Narrative  ? Not on file  ? ?Social Determinants of Health  ? ?Financial Resource Strain: Not on file  ?Food Insecurity: Not on file  ?Transportation Needs: Not on file  ?Physical Activity: Not on file  ?Stress: Not on file  ?Social Connections: Not on file  ?Intimate Partner Violence: Not on file  ? ? ?  ?  Family History  ?Problem Relation Age of Onset  ? Breast cancer Mother   ?     dx 56s-70s  ? Colon cancer Father 44  ?     metastatic  ? Breast cancer Maternal Aunt   ?     x2 maternal aunts; dx 61s  ? Cancer Paternal Aunt   ?     unknown type; d. 106s  ? Esophageal cancer Paternal Uncle   ?     d. 77s  ? Cancer Paternal Uncle   ?     unknown type; d. 63s  ? Ovarian cancer Paternal Grandmother   ?     d. late 37s-early 40s  ? ? ?Vitals:  ? 09/03/21 0929  ?BP: 132/80  ?Pulse: (!) 42  ?SpO2: 98%  ?Weight: 97.3 kg (214 lb 9.6 oz)  ? ? ? ?PHYSICAL EXAM: ?General:  elderly Well appearing. No resp difficulty ?HEENT: normal ?Neck: supple. no JVD. Carotids 2+ bilat; no bruits. No lymphadenopathy or thryomegaly appreciated. ?Cor: PMI nondisplaced. Regular brady   No rubs, gallops or murmurs. ?Lungs: clear ?Abdomen: soft, nontender, nondistended. No hepatosplenomegaly. No bruits or masses. Good bowel sounds. ?Extremities: no cyanosis, clubbing, rash, tr edema ?Neuro: alert & orientedx3, cranial nerves grossly intact. moves all 4 extremities w/o difficulty. Affect pleasant ? ?ECG SB 53 1AVB (2107m) +1 PVC Personally reviewed ? ?ASSESSMENT & PLAN: ? ?1. Right Breast Cancer ?- Diagnosed 7/22. ?- R mastectomy in 8/22. Stage IIa ER+, HER2 +, PR-   ?- Started therapy with Herceptin q21 days in 9/22 ? ?2. Chemo-induced cardiotoxicty with mild systolic HF ?- Echo 104/04EF 50-55% GLS -20.7  ?- Echo 8/22 55-60% GLS -21.9 ?- Echo 2/23 45-50% GLS -14.5 (I felt 50-55%) ?- Echo today 09/03/21 EF ~45% Personally reviewed ?- EF definitely seems to be dropping slowing c/w chemo-induced cardiotoxicity.  ?- Will plan scheduled interruption of herceptin. Hold x 2 cycles . Repeat echo in early June Hopefully can restart then ?- Continue carvedilol to 3.125 bid (will not increase with bradycardia) ?- Continue lisinopril  ?- Change Bumex to Jardiance 10 ? ?3. HTN  ?- Blood pressure well controlled. Continue current regimen. ? ?4. PVCs on ECG ?- Zio 3/23 6.2% PVCs. May be contributing to cardiomyopathy ?- Continue low-dose b-blocker ?- Arrange sleep study ? ?5. Bradycardia ?- continue to follow ?- doesn't qualify for PPM at this point ? ?DGlori Bickers MD  ?9:36 AM ? ? ? ?

## 2021-09-04 LAB — ECHOCARDIOGRAM COMPLETE
AR max vel: 1.81 cm2
AV Peak grad: 7.7 mmHg
Ao pk vel: 1.39 m/s
Area-P 1/2: 3.13 cm2
Calc EF: 38.4 %
MV M vel: 4.55 m/s
MV Peak grad: 82.6 mmHg
P 1/2 time: 904 msec
S' Lateral: 3.9 cm
Single Plane A2C EF: 40 %
Single Plane A4C EF: 37.2 %

## 2021-09-10 ENCOUNTER — Inpatient Hospital Stay: Payer: Medicare Other

## 2021-09-10 ENCOUNTER — Inpatient Hospital Stay: Payer: Medicare Other | Admitting: Hematology and Oncology

## 2021-10-01 ENCOUNTER — Ambulatory Visit: Payer: Medicare Other

## 2021-10-01 ENCOUNTER — Encounter: Payer: Self-pay | Admitting: *Deleted

## 2021-10-14 ENCOUNTER — Ambulatory Visit: Payer: Self-pay | Admitting: *Deleted

## 2021-10-14 NOTE — Telephone Encounter (Signed)
Summary: advice - constipation   Pt has PCP and stated he is unable to reach his doctors office, pt had some questions regarding dulcolax / pt stated he is constipated.        Chief Complaint: constipation Symptoms: noted constipation started after taking jardiance per patient. Reports taking jardiance 1 month and developed constipation started taking dulcolax and when stopped started having issues with constipation again. Blood noted in stool. Frequency: LBM today  Pertinent Negatives: Patient denies constipation now  Disposition: '[]'$ ED /'[]'$ Urgent Care (no appt availability in office) / '[]'$ Appointment(In office/virtual)/ '[]'$  [facility]/ '[x]'$ Home Care/ '[]'$ Refused Recommended Disposition /'[]'$ [facility]/ '[x]'$  Follow-up with PCP Additional Notes:   Recommended to contact PCP and review medications that may cause constipation.     Reason for Disposition  MILD constipation  Answer Assessment - Initial Assessment Questions 1. STOOL PATTERN OR FREQUENCY: "How often do you have a bowel movement (BM)?"  (Normal range: 3 times a day to every 3 days)  "When was your last BM?"       Every day . LBM today  2. STRAINING: "Do you have to strain to have a BM?"      No  3. RECTAL PAIN: "Does your rectum hurt when the stool comes out?" If Yes, ask: "Do you have hemorrhoids? How bad is the pain?"  (Scale 1-10; or mild, moderate, severe)     na 4. STOOL COMPOSITION: "Are the stools hard?"      na 5. BLOOD ON STOOLS: "Has there been any blood on the toilet tissue or on the surface of the BM?" If Yes, ask: "When was the last time?"      10/09/21 6. CHRONIC CONSTIPATION: "Is this a new problem for you?"  If no, ask: "How long have you had this problem?" (days, weeks, months)      Yes  7. CHANGES IN DIET OR HYDRATION: "Have there been any recent changes in your diet?" "How much fluids are you drinking on a daily basis?"  "How much have you had to drink today?"     No constipation  8.  MEDICATIONS: "Have you been taking any new medications?" "Are you taking any narcotic pain medications?" (e.g., Vicodin, Percocet, morphine, Dilaudid)     No  9. LAXATIVES: "Have you been using any stool softeners, laxatives, or enemas?"  If yes, ask "What, how often, and when was the last time?"     Dulcolax  10. ACTIVITY:  "How much walking do you do every day?"  "Has your activity level decreased in the past week?"        Na 11. CAUSE: "What do you think is causing the constipation?"        Jardiance  12. OTHER SYMPTOMS: "Do you have any other symptoms?" (e.g., abdominal pain, bloating, fever, vomiting)       No  13. MEDICAL HISTORY: "Do you have a history of hemorrhoids, rectal fissures, or rectal surgery or rectal abscess?"         na 14. PREGNANCY: "Is there any chance you are pregnant?" "When was your last menstrual period?"       na  Protocols used: Constipation-A-AH

## 2021-10-19 NOTE — Progress Notes (Incomplete)
Patient Care Team: Orpah Melter, MD as PCP - General (Family Medicine) [name], MD as Consulting Physician (General Surgery) Rockwell Germany, RN as Oncology Nurse Navigator Mauro Kaufmann, RN as Oncology Nurse Navigator Gery Pray, MD as Consulting Physician (Radiation Oncology)  DIAGNOSIS: No diagnosis found.  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, estrogen receptor positive ([place])  11/28/2020 Initial Diagnosis   Right central breast (retroareolar) biopsy 11/28/2020 for a clinical T2-T4, N0, stage IIA-IIIB invasive ductal carcinoma, ER positive PR negative, HER2 positive, with a Ki-67 of 15%   12/15/2020 Cancer Staging   Staging form: Breast, AJCC 8th Edition - Clinical: Stage IIIB (cT4, cN0, cM0, G2, ER+, PR-, HER2+) - Signed by Chauncey Cruel, MD on 12/15/2020 Histologic grading system: 3 grade system    12/30/2020 Surgery   Right mastectomy 12/30/2020 for a pT2, pN0, stage IIA invasive ductal carcinoma, grade 2, with negative margins 0/1 lymph node negative   01/22/2021 -  Chemotherapy   Patient is on Treatment Plan : BREAST Trastuzumab q21d      02/13/2021 Genetic Testing   Negative hereditary cancer genetic testing: no pathogenic variants detected in [facility] CancerNext-Expanded +RNAinsight Panel.  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  The report date is February 13, 2021.   The CancerNext-Expanded gene panel offered by [facility] and includes sequencing, rearrangement, and RNA analysis for the following 77 genes: AIP, ALK, APC, ATM, AXIN2, BAP1, BARD1, BLM, BMPR1A, BRCA1, BRCA2, BRIP1, CDC73, CDH1, CDK4, CDKN1B, CDKN2A, CHEK2, CTNNA1, DICER1, FANCC, FH, FLCN, GALNT12, KIF1B, LZTR1, MAX, MEN1, MET, MLH1, MSH2, MSH3, MSH6, MUTYH, NBN, NF1, NF2, NTHL1, PALB2, PHOX2B, PMS2, POT1, PRKAR1A, PTCH1, PTEN, RAD51C, RAD51D, RB1, RECQL, RET, SDHA, SDHAF2, SDHB, SDHC,  SDHD, SMAD4, SMARCA4, SMARCB1, SMARCE1, STK11, SUFU, TMEM127, TP53, TSC1, TSC2, VHL and XRCC2 (sequencing and deletion/duplication); EGFR, EGLN1, HOXB13, KIT, MITF, PDGFRA, POLD1, and POLE (sequencing only); EPCAM and GREM1 (deletion/duplication only).    03/09/2021 -  Anti-estrogen oral therapy   tamoxifen started 03/09/2021     CHIEF COMPLIANT: Follow-up of estrogen receptor and Her2 positive breast cancer    INTERVAL HISTORY: Terrance Powell is a 87 y.o. with above-mentioned history of estrogen receptor and Her2 positive breast cancer. She presents to the clinic today for follow-up.     ALLERGIES:  is allergic to penicillins.  MEDICATIONS:  Current Outpatient Medications  Medication Sig Dispense Refill   bumetanide (BUMEX) 0.5 MG tablet Take 1 tablet (0.5 mg total) by mouth as needed.     carvedilol (COREG) 3.125 MG tablet Take 1 tablet (3.125 mg total) by mouth 2 (two) times daily.     empagliflozin (JARDIANCE) 10 MG TABS tablet Take 1 tablet (10 mg total) by mouth daily before breakfast. 30 tablet 11   lisinopril (PRINIVIL,ZESTRIL) 10 MG tablet Take 20 mg by mouth daily. Takes 2 tablets     tamoxifen (NOLVADEX) 20 MG tablet Take 20 mg by mouth daily.     No current facility-administered medications for this visit.    PHYSICAL EXAMINATION: ECOG PERFORMANCE STATUS: {CHL ONC ECOG PS:[id number]}  There were no vitals filed for this visit. There were no vitals filed for this visit.  BREAST:*** No palpable masses or nodules in either right or left breasts. No palpable axillary supraclavicular or infraclavicular adenopathy no breast tenderness or nipple discharge. (exam performed in the presence of a chaperone)  LABORATORY DATA:  I have reviewed the data  as listed    Latest Ref Rng & Units 07/30/2021    8:44 AM 06/18/2021    8:38 AM 05/28/2021    8:43 AM  CMP  Glucose 70 - 99 mg/dL 87   94   99    BUN 8 - 23 mg/dL 23   22   21     Creatinine 0.61 - 1.24 mg/dL 1.52   1.46   1.39     Sodium 135 - 145 mmol/L 140   142   140    Potassium 3.5 - 5.1 mmol/L 3.6   3.8   3.9    Chloride 98 - 111 mmol/L 107   108   109    CO2 22 - 32 mmol/L 28   28   23     Calcium 8.9 - 10.3 mg/dL 9.0   9.1   9.1    Total Protein 6.5 - 8.1 g/dL 6.9   7.3   7.3    Total Bilirubin 0.3 - 1.2 mg/dL 0.7   0.5   0.5    Alkaline Phos 38 - 126 U/L 47   49   54    AST 15 - 41 U/L 16   17   17     ALT 0 - 44 U/L 12   17   17       Lab Results  Component Value Date   WBC 8.6 07/30/2021   HGB 13.2 07/30/2021   HCT 38.7 (L) 07/30/2021   MCV 90.4 07/30/2021   PLT 278 07/30/2021   NEUTROABS 5.1 07/30/2021    ASSESSMENT & PLAN:  No problem-specific Assessment & Plan notes found for this encounter.    No orders of the defined types were placed in this encounter.  The patient has a good understanding of the overall plan. he agrees with it. he will call with any problems that may develop before the next visit here. Total time spent: 30 mins including face to face time and time spent for planning, charting and co-ordination of care   Suzzette Righter, [place] 10/19/21    I Gardiner Coins am scribing for Dr. Lindi Adie  ***

## 2021-10-26 NOTE — Progress Notes (Signed)
CARDIO-ONCOLOGY CLINIC NOTE  Referring Physician: Dr. Lindi Adie Primary Care: Orpah Melter, MD Primary Cardiologist: None   HPI:  [name] is an  87 y.o. male with HTN,  CKD 3a, previous colon CA and right breast cancer referred by Dr. Lindi Adie for enrollment into the Cardio-Oncology program due to possible Herceptin cardiotoxicity.   Diagnosed with R breast cancer in 7/22. Had R mastectomy in 8/22. Stage IIa ER+, HER2 +, PR-  Started therapy with Herceptin q21 days in 9/22  EF 2/23 EF  45-50% GLS -14.5 (I felt 50-55%) Echo 11/22 EF 50-55% GLS -20.7  Echo 8/22 55-60% GLS -21.9  Zio 3.23 6.2% PVCs  Echo 4/23 EF ~45% (definitely lower since previous) Herceptin held  Here for f/u. Feels good. Denies CP or SOB. Not very active. Has lost some weight. Reports some LE edema. Stopped jardiance as he thought it may be causing constipation and a rash on his chest. Rash did not resolve after stopping the medicine. BP has been elevated, 150s-160 at home.  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     sacubitril-valsartan (ENTRESTO) 49-51 MG Take 1 tablet by mouth 2 (two) times daily. 60 tablet 3   tamoxifen (NOLVADEX) 20 MG tablet Take 20 mg by mouth daily.     No current facility-administered medications for this encounter.    Allergies  Allergen Reactions   Penicillins Diarrhea, Itching and Other (See Comments)    TESTICULAR PAIN  Has patient had a PCN reaction causing immediate rash,  facial/tongue/throat swelling, SOB or lightheadedness with hypotension: No SEVERE RASH INVOLVING MUCUS MEMBRANES or SKIN NECROSIS: #  #  #  YES  #  #  #  Has patient had a PCN reaction that required hospitalization No Has patient had a PCN reaction occurring within the last 10 years: No.    Jardiance [Empagliflozin] Other (See Comments)    Constipation       Social History   Socioeconomic History   Marital status: Married    Spouse name: Not on file   Number of children: Not on file   Years of education: Not on file   Highest education level: Not on file  Occupational History   Not on file  Tobacco Use   Smoking status: Former   Smokeless tobacco: Never   Tobacco comments:    "toyed with " none in over 40 years"  Vaping Use   Vaping Use: Never used  Substance and Sexual Activity   Alcohol use: Yes    Comment: 09/08/2016 "might have a couple drinks on holidays"   Drug use: No   Sexual activity: Not Currently  Other Topics Concern   Not on file  Social History Narrative   Not on file   Social Determinants of Health   Financial Resource Strain: Not on file  Food Insecurity: Not on file  Transportation Needs: Not on file  Physical Activity: Not on file  Stress: Not on file  Social Connections: Not on file  Intimate Partner Violence: Not on file      Family History  Problem Relation Age of Onset   Breast cancer Mother        dx 57s-70s   Colon cancer Father 43       metastatic   Breast cancer Maternal Aunt        x2 maternal aunts; dx 63s   Cancer Paternal Aunt        unknown type; d. 54s   Esophageal cancer Paternal Uncle        d. 64s   Cancer Paternal Uncle        unknown type; d. 63s   Ovarian cancer Paternal Grandmother        d. late 30s-early 2s    Vitals:   10/27/21 1013 10/27/21 1031  BP: (!) 160/94 (!) 164/82  Pulse: (!) 48   SpO2: 98%   Weight: 94.7 kg (208 lb 12.8 oz)       PHYSICAL EXAM: General:  Well appearing. No resp  difficulty HEENT: normal Neck: supple. no JVD. Carotids 2+ bilat; no bruits. No lymphadenopathy or thryomegaly appreciated. Cor: PMI nondisplaced. Regular brady No rubs, gallops or murmurs. Lungs: clear Abdomen: soft, nontender, nondistended. No hepatosplenomegaly. No bruits or masses. Good bowel sounds. Extremities: no cyanosis, clubbing, rash, trace edema Neuro: alert & orientedx3, cranial nerves grossly intact. moves all 4 extremities w/o difficulty. Affect pleasant    ECG Sinus rhythm 47 bpm, 1st degree AVB Personally reviewed   ASSESSMENT & PLAN:  1. Right Breast Cancer - Diagnosed 7/22. - R mastectomy in 8/22. Stage IIa ER+, HER2 +, PR-   - Started therapy with Herceptin q21 days in 9/22 - Herceptin held 4/23 x 3 doses - EF stable at 45-50% today. GLS normal. I think we should restart Herceptin for another 2 doses and see how he tolerates. Need to try to get him through his therapy. D/w Dr. Lindi Adie   2. Chemo-induced cardiotoxicty with mild systolic HF - Echo 42/59 EF 50-55% GLS -20.7  - Echo 8/22 55-60% GLS -21.9 - Echo 2/23 45-50% GLS -14.5 (I felt 50-55%) - Echo 09/03/21 EF ~45% -> Herceptin held  - Echo 6/23: EF 45-50% G1DD GLS -19.7% Personally reviewed - Suspect mild chemo-induced cardiotoxicity. Herceptin held 4/23 - Continue carvedilol to 3.125 bid (will not increase with bradycardia) - BP up. Switch lisinopril 30 to Entresto 49/51 bid - Failed Jardiance due to rash. Can rechallenge with Wilder Glade down the road  - Consider spiro if BP remains high  - BMET, BNP today, BMET again in 2 weeks. - As above. Restart Herceptin. Repeat echo in 2 months.   3. HTN  - Blood pressure high.  - Meds as above  4. PVCs on ECG - Zio 3/23 6.2% PVCs. May be contributing to cardiomyopathy - Continue low-dose b-blocker - Has not completed home sleep study  5. Bradycardia - continue to follow - doesn't qualify for PPM at this point   Glori Bickers, MD  2:43 PM

## 2021-10-27 ENCOUNTER — Ambulatory Visit (HOSPITAL_BASED_OUTPATIENT_CLINIC_OR_DEPARTMENT_OTHER)
Admission: RE | Admit: 2021-10-27 | Discharge: 2021-10-27 | Disposition: A | Discharge status: Home / Self Care | Payer: Medicare Other | Source: Ambulatory Visit | Attending: Internal Medicine | Admitting: Internal Medicine

## 2021-10-27 ENCOUNTER — Other Ambulatory Visit (HOSPITAL_COMMUNITY): Payer: Self-pay

## 2021-10-27 ENCOUNTER — Encounter: Payer: Self-pay | Admitting: *Deleted

## 2021-10-27 ENCOUNTER — Telehealth: Payer: Self-pay | Admitting: *Deleted

## 2021-10-27 ENCOUNTER — Ambulatory Visit (HOSPITAL_COMMUNITY)
Admission: RE | Admit: 2021-10-27 | Discharge: 2021-10-27 | Disposition: A | Discharge status: Home / Self Care | Payer: Medicare Other | Source: Ambulatory Visit | Attending: Internal Medicine | Admitting: Internal Medicine

## 2021-10-27 VITALS — BP 164/82 | HR 48 | Wt 208.8 lb

## 2021-10-27 DIAGNOSIS — I080 Rheumatic disorders of both mitral and aortic valves: Secondary | ICD-10-CM | POA: Insufficient documentation

## 2021-10-27 DIAGNOSIS — Z79899 Other long term (current) drug therapy: Secondary | ICD-10-CM | POA: Diagnosis not present

## 2021-10-27 DIAGNOSIS — C50121 Malignant neoplasm of central portion of right male breast: Secondary | ICD-10-CM

## 2021-10-27 DIAGNOSIS — R21 Rash and other nonspecific skin eruption: Secondary | ICD-10-CM | POA: Diagnosis not present

## 2021-10-27 DIAGNOSIS — I130 Hypertensive heart and chronic kidney disease with heart failure and stage 1 through stage 4 chronic kidney disease, or unspecified chronic kidney disease: Secondary | ICD-10-CM | POA: Insufficient documentation

## 2021-10-27 DIAGNOSIS — N1831 Chronic kidney disease, stage 3a: Secondary | ICD-10-CM | POA: Diagnosis not present

## 2021-10-27 DIAGNOSIS — Z853 Personal history of malignant neoplasm of breast: Secondary | ICD-10-CM | POA: Insufficient documentation

## 2021-10-27 DIAGNOSIS — I427 Cardiomyopathy due to drug and external agent: Secondary | ICD-10-CM

## 2021-10-27 DIAGNOSIS — I509 Heart failure, unspecified: Secondary | ICD-10-CM | POA: Insufficient documentation

## 2021-10-27 DIAGNOSIS — I493 Ventricular premature depolarization: Secondary | ICD-10-CM | POA: Insufficient documentation

## 2021-10-27 DIAGNOSIS — Z170 Estrogen receptor positive status [ER+]: Secondary | ICD-10-CM

## 2021-10-27 DIAGNOSIS — Z85038 Personal history of other malignant neoplasm of large intestine: Secondary | ICD-10-CM | POA: Insufficient documentation

## 2021-10-27 DIAGNOSIS — I10 Essential (primary) hypertension: Secondary | ICD-10-CM | POA: Diagnosis not present

## 2021-10-27 DIAGNOSIS — Z9011 Acquired absence of right breast and nipple: Secondary | ICD-10-CM | POA: Insufficient documentation

## 2021-10-27 DIAGNOSIS — I428 Other cardiomyopathies: Secondary | ICD-10-CM | POA: Diagnosis not present

## 2021-10-27 DIAGNOSIS — Z0189 Encounter for other specified special examinations: Secondary | ICD-10-CM | POA: Diagnosis not present

## 2021-10-27 LAB — BASIC METABOLIC PANEL
Anion gap: 7 (ref 5–15)
BUN: 10 mg/dL (ref 8–23)
CO2: 23 mmol/L (ref 22–32)
Calcium: 9 mg/dL (ref 8.9–10.3)
Chloride: 109 mmol/L (ref 98–111)
Creatinine, Ser: 1.43 mg/dL — ABNORMAL HIGH (ref 0.61–1.24)
GFR, Estimated: 48 mL/min — ABNORMAL LOW (ref >60)
Glucose, Bld: 92 mg/dL (ref 70–99)
Potassium: 4.2 mmol/L (ref 3.5–5.1)
Sodium: 139 mmol/L (ref 135–145)

## 2021-10-27 LAB — BRAIN NATRIURETIC PEPTIDE: B Natriuretic Peptide: 154.9 pg/mL — ABNORMAL HIGH (ref 0.0–100.0)

## 2021-10-27 LAB — ECHOCARDIOGRAM COMPLETE
Area-P 1/2: 2.44 cm2
Calc EF: 46.7 %
S' Lateral: 3.85 cm
Single Plane A2C EF: 49.4 %
Single Plane A4C EF: 44.7 %

## 2021-10-27 MED ORDER — ENTRESTO 49-51 MG PO TABS: 1.0000 | ORAL_TABLET | Freq: Two times a day (BID) | ORAL | 3 refills | Status: DC

## 2021-10-27 NOTE — Progress Notes (Signed)
Per MD, Dr. Haroldine Laws has approved pt to receive 2 additional rounds of herceptin prior to repeating echo.  Scheduling message placed.

## 2021-10-27 NOTE — Patient Instructions (Signed)
Stay off Jardiance  STOP Lisinopril  Start Entresto 49/51 mg Twice daily STARTING ON THURSDAY 6/15  Labs done today, we will call you for abnormal results  Your physician recommends that you return for lab work in: 2 weeks  Your physician recommends that you schedule a follow-up appointment in: 2 months with echocardiogram  If you have any questions or concerns before your next appointment please send [place] a message through [place] or call our office at [phone].    TO LEAVE A MESSAGE FOR THE NURSE SELECT OPTION 2, PLEASE LEAVE A MESSAGE INCLUDING: YOUR NAME DATE OF BIRTH CALL BACK NUMBER REASON FOR CALL**this is important as we prioritize the call backs  YOU WILL RECEIVE A CALL BACK THE SAME DAY AS LONG AS YOU CALL BEFORE 4:00 PM  At the [facility], you and your health needs are our priority. As part of our continuing mission to provide you with exceptional heart care, we have created designated Provider Care Teams. These Care Teams include your primary Cardiologist (physician) and Advanced Practice Providers (APPs- Physician Assistants and Nurse Practitioners) who all work together to provide you with the care you need, when you need it.   You may see any of the following providers on your designated Care Team at your next follow up: Dr Glori Bickers Dr Haynes Kerns, NP Lyda Jester, [place] [facility] [place], [place] Audry Riles, PharmD   Please be sure to bring in all your medications bottles to every appointment.

## 2021-10-27 NOTE — Telephone Encounter (Signed)
Received call from pt stating he was okay with proceeding with tx 11/02/21.  Pt states infusion for 11/23/21 will need to be rescheduled to 12/07/21 due [place].  RN informed scheduling team to adjust appts due to pt upcoming vacation.

## 2021-10-28 ENCOUNTER — Telehealth: Payer: Self-pay | Admitting: Hematology and Oncology

## 2021-10-28 NOTE — Telephone Encounter (Signed)
Scheduled appointment per 6/13 secure chat (per Ronaldo Miyamoto). Patient is aware.

## 2021-11-02 ENCOUNTER — Inpatient Hospital Stay: Payer: Medicare Other

## 2021-11-02 ENCOUNTER — Other Ambulatory Visit: Payer: Self-pay

## 2021-11-02 ENCOUNTER — Inpatient Hospital Stay: Payer: Medicare Other | Admitting: Hematology and Oncology

## 2021-11-02 ENCOUNTER — Encounter: Payer: Self-pay | Admitting: *Deleted

## 2021-11-02 ENCOUNTER — Inpatient Hospital Stay: Payer: Medicare Other | Attending: Oncology | Admitting: Hematology and Oncology

## 2021-11-02 DIAGNOSIS — Z9011 Acquired absence of right breast and nipple: Secondary | ICD-10-CM | POA: Diagnosis not present

## 2021-11-02 DIAGNOSIS — Z79810 Long term (current) use of selective estrogen receptor modulators (SERMs): Secondary | ICD-10-CM | POA: Insufficient documentation

## 2021-11-02 DIAGNOSIS — Z170 Estrogen receptor positive status [ER+]: Secondary | ICD-10-CM | POA: Diagnosis not present

## 2021-11-02 DIAGNOSIS — Z5112 Encounter for antineoplastic immunotherapy: Secondary | ICD-10-CM | POA: Diagnosis present

## 2021-11-02 DIAGNOSIS — Z85038 Personal history of other malignant neoplasm of large intestine: Secondary | ICD-10-CM | POA: Insufficient documentation

## 2021-11-02 DIAGNOSIS — C50121 Malignant neoplasm of central portion of right male breast: Secondary | ICD-10-CM | POA: Diagnosis present

## 2021-11-02 LAB — CMP (CANCER CENTER ONLY)
ALT: 11 U/L (ref 0–44)
AST: 16 U/L (ref 15–41)
Albumin: 4.1 g/dL (ref 3.5–5.0)
Alkaline Phosphatase: 49 U/L (ref 38–126)
Anion gap: 8 (ref 5–15)
BUN: 22 mg/dL (ref 8–23)
CO2: 23 mmol/L (ref 22–32)
Calcium: 9.5 mg/dL (ref 8.9–10.3)
Chloride: 110 mmol/L (ref 98–111)
Creatinine: 1.55 mg/dL — ABNORMAL HIGH (ref 0.61–1.24)
GFR, Estimated: 44 mL/min — ABNORMAL LOW (ref >60)
Glucose, Bld: 96 mg/dL (ref 70–99)
Potassium: 3.9 mmol/L (ref 3.5–5.1)
Sodium: 141 mmol/L (ref 135–145)
Total Bilirubin: 0.5 mg/dL (ref 0.3–1.2)
Total Protein: 7 g/dL (ref 6.5–8.1)

## 2021-11-02 LAB — CBC WITH DIFFERENTIAL (CANCER CENTER ONLY)
Abs Immature Granulocytes: 0.02 10*3/uL (ref 0.00–0.07)
Basophils Absolute: 0.1 10*3/uL (ref 0.0–0.1)
Basophils Relative: 1 %
Eosinophils Absolute: 0.3 10*3/uL (ref 0.0–0.5)
Eosinophils Relative: 4 %
HCT: 43.4 % (ref 39.0–52.0)
Hemoglobin: 15.3 g/dL (ref 13.0–17.0)
Immature Granulocytes: 0 %
Lymphocytes Relative: 31 %
Lymphs Abs: 2.8 10*3/uL (ref 0.7–4.0)
MCH: 31.3 pg (ref 26.0–34.0)
MCHC: 35.3 g/dL (ref 30.0–36.0)
MCV: 88.8 fL (ref 80.0–100.0)
Monocytes Absolute: 0.8 10*3/uL (ref 0.1–1.0)
Monocytes Relative: 9 %
Neutro Abs: 5 10*3/uL (ref 1.7–7.7)
Neutrophils Relative %: 55 %
Platelet Count: 252 10*3/uL (ref 150–400)
RBC: 4.89 MIL/uL (ref 4.22–5.81)
RDW: 12.8 % (ref 11.5–15.5)
WBC Count: 9 10*3/uL (ref 4.0–10.5)
nRBC: 0 % (ref 0.0–0.2)

## 2021-11-02 MED ORDER — DIPHENHYDRAMINE HCL 25 MG PO CAPS
50.0000 mg | ORAL_CAPSULE | Freq: Once | ORAL | Status: AC
  Administered 2021-11-02: 50 mg via ORAL
  Filled 2021-11-02: qty 2

## 2021-11-02 MED ORDER — ACETAMINOPHEN 325 MG PO TABS
650.0000 mg | ORAL_TABLET | Freq: Once | ORAL | Status: AC
  Administered 2021-11-02: 650 mg via ORAL
  Filled 2021-11-02: qty 2

## 2021-11-02 MED ORDER — POLYETHYLENE GLYCOL 3350 17 G PO PACK: 17.0000 g | PACK | Freq: Every day | ORAL | 0 refills | Status: AC

## 2021-11-02 MED ORDER — SODIUM CHLORIDE 0.9 % IV SOLN: Freq: Once | INTRAVENOUS | Status: AC

## 2021-11-02 MED ORDER — TRASTUZUMAB-ANNS CHEMO 150 MG IV SOLR
6.0000 mg/kg | Freq: Once | INTRAVENOUS | Status: AC
  Administered 2021-11-02: 546 mg via INTRAVENOUS
  Filled 2021-11-02: qty 26

## 2021-11-02 NOTE — Progress Notes (Signed)
 Patient Care Team: Meyers, Stephen, MD as PCP - General (Family Medicine) Blackman, Douglas, MD as Consulting Physician (General Surgery) Martini, Keisha N, RN as Oncology Nurse Navigator Stuart, Dawn C, RN as Oncology Nurse Navigator Kinard, James, MD as Consulting Physician (Radiation Oncology)  DIAGNOSIS:  Encounter Diagnosis  Name Primary?   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, HER2 positive, with a Ki-67 of 15%   12/15/2020 Cancer Staging   Staging form: Breast, AJCC 8th Edition - Clinical: Stage IIIB (cT4, cN0, cM0, G2, ER+, PR-, HER2+) - Signed by Magrinat, Gustav C, MD on 12/15/2020 Histologic grading system: 3 grade system   12/30/2020 Surgery   Right mastectomy 12/30/2020 for a pT2, pN0, stage IIA invasive ductal carcinoma, grade 2, with negative margins 0/1 lymph node negative   01/22/2021 -  Chemotherapy   Patient is on Treatment Plan : BREAST Trastuzumab q21d     02/13/2021 Genetic Testing   Negative hereditary cancer genetic testing: no pathogenic variants detected in [facility] CancerNext-Expanded +RNAinsight Panel.  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  The report date is February 13, 2021.   The CancerNext-Expanded gene panel offered by [facility] and includes sequencing, rearrangement, and RNA analysis for the following 77 genes: AIP, ALK, APC, ATM, AXIN2, BAP1, BARD1, BLM, BMPR1A, BRCA1, BRCA2, BRIP1, CDC73, CDH1, CDK4, CDKN1B, CDKN2A, CHEK2, CTNNA1, DICER1, FANCC, FH, FLCN, GALNT12, KIF1B, LZTR1, MAX, MEN1, MET, MLH1, MSH2, MSH3, MSH6, MUTYH, NBN, NF1,  NF2, NTHL1, PALB2, PHOX2B, PMS2, POT1, PRKAR1A, PTCH1, PTEN, RAD51C, RAD51D, RB1, RECQL, RET, SDHA, SDHAF2, SDHB, SDHC, SDHD, SMAD4, SMARCA4, SMARCB1, SMARCE1, STK11, SUFU, TMEM127, TP53, TSC1, TSC2, VHL and XRCC2 (sequencing and deletion/duplication); EGFR, EGLN1, HOXB13, KIT, MITF, PDGFRA, POLD1, and POLE (sequencing only); EPCAM and GREM1 (deletion/duplication only).    03/09/2021 -  Anti-estrogen oral therapy   tamoxifen started 03/09/2021     CHIEF COMPLIANT: Herceptin and Tamoxifen follow-up  INTERVAL HISTORY: Terrance Powell is a  86 y.o. with above-mentioned history of estrogen receptor and Her2 positive breast cancer. He presents to the clinic today for follow-up. He states that he had some constipation. States that he has a rash on chest. States he has no sensation or itching from the rash. Overall he is doing well.   ALLERGIES:  is allergic to penicillins and jardiance [empagliflozin].  MEDICATIONS:  Current Outpatient Medications  Medication Sig Dispense Refill   polyethylene glycol (MIRALAX) 17 g packet Take 17 g by mouth daily. 14 each 0   carvedilol (COREG) 6.25 MG tablet Take 6.25 mg by mouth 2 (two) times daily.     sacubitril-valsartan (ENTRESTO) 49-51 MG Take 1 tablet by mouth 2 (two) times daily. 60 tablet 3   tamoxifen (NOLVADEX) 20 MG tablet Take 20 mg by mouth daily.     No current facility-administered medications for this visit.    PHYSICAL EXAMINATION: ECOG PERFORMANCE STATUS: 1 - Symptomatic but completely ambulatory  Vitals:   11/02/21 0920  BP: (!) 121/57  Pulse: 65  Resp: 18  Temp: (!) 97.5 F (36.4 C)  SpO2: 100%   Filed Weights   11/02/21 0920  Weight: 202 lb 11.2 oz (  91.9 kg)      LABORATORY DATA:  I have reviewed the data as listed    Latest Ref Rng & Units 11/02/2021    8:52 AM 10/27/2021   11:30 AM 07/30/2021    8:44 AM  CMP  Glucose 70 - 99 mg/dL 96  92  87   BUN 8 - 23 mg/dL _0 Creatinine 0.61 - 1.24 mg/dL 1.55  1.43   1.52   Sodium 135 - 145 mmol/L 141  139  140   Potassium 3.5 - 5.1 mmol/L 3.9  4.2  3.6   Chloride 98 - 111 mmol/L 110  109  107   CO2 22 - 32 mmol/L _1 Calcium 8.9 - 10.3 mg/dL 9.5  9.0  9.0   Total Protein 6.5 - 8.1 g/dL 7.0   6.9   Total Bilirubin 0.3 - 1.2 mg/dL 0.5   0.7   Alkaline Phos 38 - 126 U/L 49   47   AST 15 - 41 U/L 16   16   ALT 0 - 44 U/L 11   12     Lab Results  Component Value Date   WBC 9.0 11/02/2021   HGB 15.3 11/02/2021   HCT 43.4 11/02/2021   MCV 88.8 11/02/2021   PLT 252 11/02/2021   NEUTROABS 5.0 11/02/2021    ASSESSMENT & PLAN:  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, estrogen receptor positive ([place])  11/28/2020: Right central breast (retroareolar) biopsy 11/28/2020 for a clinical T2-T4, N0, stage IIA-IIIB invasive ductal carcinoma, ER positive PR negative, HER2 positive, with a Ki-67 of 15%   2018: History of stage I colon cancer   12/30/2020:Right mastectomy 12/30/2020 for a pT2, pN0, stage IIA invasive ductal carcinoma, grade 2, with negative margins 0/1 lymph node negative   Current treatment: Trastuzumab started 01/22/2021, tamoxifen started 03/09/2021 Toxicities: Tolerating the treatment extremely well  Denies any hot flashes or arthralgias or myalgias.   Echocardiogram 04/07/2021: EF 50 to 55% Echocardiogram 07/02/2021: EF 45 to 50% (okay to treat as per cardiology guidance)  Echocardiogram 10/27/2021: EF 45 to 50% (proceeding with treatment with cardiology guidance) He has another echocardiogram coming up in August under the guidance of Dr. Haroldine Laws  He has a cruise in July and would like to make sure that we do not schedule his treatment during that time. Slight rash on the chest probably related to Jardiance  Return to clinic every 3 weeks for trastuzumab and every 6 weeks for follow-up with me.  He will finish his treatments on 12/30/2021   No orders of the defined types were placed in this encounter.  The  patient has a good understanding of the overall plan. he agrees with it. he will call with any problems that may develop before the next visit here. Total time spent: 30 mins including face to face time and time spent for planning, charting and co-ordination of care   Harriette Ohara, MD 11/02/21    I Gardiner Coins am scribing for Dr. Lindi Adie  I have reviewed the above documentation for accuracy and completeness, and I agree with the above.

## 2021-11-02 NOTE — Patient Instructions (Signed)
[facility]COLOG[facility]  Discharge Instructions: Thank you for choosing [facility] to provide your oncology and hematology care.   If you have a lab appointment with the Cancer Center, please go directly to the Cancer Center and check in at the registration area.   Wear comfortable clothing and clothing appropriate for easy access to any Portacath or PICC line.   We strive to give you quality time with your provider. You may need to reschedule your appointment if you arrive late (15 or more minutes).  Arriving late affects you and other patients whose appointments are after yours.  Also, if you miss three or more appointments without notifying the office, you may be dismissed from the clinic at the provider's discretion.      For prescription refill requests, have your pharmacy contact our office and allow 72 hours for refills to be completed.    Today you received the following chemotherapy and/or immunotherapy agents: trastuzumab-anns (Herceptin)      To help prevent nausea and vomiting after your treatment, we encourage you to take your nausea medication as directed.  BELOW ARE SYMPTOMS THAT SHOULD BE REPORTED IMMEDIATELY: *FEVER GREATER THAN 100.4 F (38 C) OR HIGHER *CHILLS OR SWEATING *NAUSEA AND VOMITING THAT IS NOT CONTROLLED WITH YOUR NAUSEA MEDICATION *UNUSUAL SHORTNESS OF BREATH *UNUSUAL BRUISING OR BLEEDING *URINARY PROBLEMS (pain or burning when urinating, or frequent urination) *BOWEL PROBLEMS (unusual diarrhea, constipation, pain near the anus) TENDERNESS IN MOUTH AND THROAT WITH OR WITHOUT PRESENCE OF ULCERS (sore throat, sores in mouth, or a toothache) UNUSUAL RASH, SWELLING OR PAIN  UNUSUAL VAGINAL DISCHARGE OR ITCHING   Items with * indicate a potential emergency and should be followed up as soon as possible or go to the Emergency Department if any problems should occur.  Please show the CHEMOTHERAPY ALERT CARD or IMMUNOTHERAPY ALERT  CARD at check-in to the Emergency Department and triage nurse.  Should you have questions after your visit or need to cancel or reschedule your appointment, please contact [facility]  Dept: [phone]  and follow the prompts.  Office hours are 8:00 a.m. to 4:30 p.m. Monday - Friday. Please note that voicemails left after 4:00 p.m. may not be returned until the following business day.  We are closed weekends and major holidays. You have access to a nurse at all times for urgent questions. Please call the main number to the clinic Dept: [phone] and follow the prompts.   For any non-urgent questions, you may also contact your provider using MyChart. We now offer e-Visits for anyone 18 and older to request care online for non-urgent symptoms. For details visit [email or web address].   Also download the MyChart app! Go to the app store, search "MyChart", open the app, select [facility], and log in with your MyChart username and password.  Due to Covid, a mask is required upon entering the hospital/clinic. If you do not have a mask, one will be given to you upon arrival. For doctor visits, patients may have 1 support Terrance Powell aged 18 or older with them. For treatment visits, patients cannot have anyone with them due to current Covid guidelines and our immunocompromised population.  

## 2021-11-02 NOTE — Assessment & Plan Note (Addendum)
11/28/2020:Right central breast (retroareolar) biopsy 11/28/2020 for a clinical T2-T4, N0, stage IIA-IIIB invasive ductal carcinoma, ER positive PR negative, HER2 positive, with a Ki-67 of 15%  2018: History of stage I colon cancer  12/30/2020:Right mastectomy 12/30/2020 for a pT2, pN0, stage IIAinvasive ductal carcinoma, grade 2, with negative margins 0/1 lymph node negative  Current treatment: Trastuzumab started 01/22/2021, tamoxifen started 03/09/2021 Toxicities: Tolerating the treatment extremely well  Denies any hot flashes or arthralgias or myalgias.  Echocardiogram 04/07/2021: EF 50 to 55% Echocardiogram 07/02/2021: EF 45 to 50% (okay to treat as per cardiology guidance)  Echocardiogram 10/27/2021: EF 45 to 50% (proceeding with treatment with cardiology guidance) He has another echocardiogram coming up in August under the guidance of Dr. Haroldine Laws  He has a cruise in July and would like to make sure that we do not schedule his treatment during that time.  Return to clinic every 3 weeks for trastuzumab and every 6 weeks for follow-up with me.

## 2021-11-10 ENCOUNTER — Other Ambulatory Visit: Payer: Medicare Other

## 2021-11-10 ENCOUNTER — Ambulatory Visit: Payer: Medicare Other

## 2021-11-10 ENCOUNTER — Ambulatory Visit (HOSPITAL_COMMUNITY)
Admission: RE | Admit: 2021-11-10 | Discharge: 2021-11-10 | Disposition: A | Discharge status: Home / Self Care | Payer: Medicare Other | Source: Ambulatory Visit | Attending: Cardiology | Admitting: Cardiology

## 2021-11-10 ENCOUNTER — Ambulatory Visit: Payer: Medicare Other | Admitting: Hematology and Oncology

## 2021-11-10 DIAGNOSIS — Z170 Estrogen receptor positive status [ER+]: Secondary | ICD-10-CM | POA: Insufficient documentation

## 2021-11-10 DIAGNOSIS — I428 Other cardiomyopathies: Secondary | ICD-10-CM | POA: Diagnosis not present

## 2021-11-10 DIAGNOSIS — C50121 Malignant neoplasm of central portion of right male breast: Secondary | ICD-10-CM | POA: Insufficient documentation

## 2021-11-10 LAB — BASIC METABOLIC PANEL
Anion gap: 6 (ref 5–15)
BUN: 15 mg/dL (ref 8–23)
CO2: 24 mmol/L (ref 22–32)
Calcium: 8.9 mg/dL (ref 8.9–10.3)
Chloride: 110 mmol/L (ref 98–111)
Creatinine, Ser: 1.42 mg/dL — ABNORMAL HIGH (ref 0.61–1.24)
GFR, Estimated: 49 mL/min — ABNORMAL LOW (ref >60)
Glucose, Bld: 96 mg/dL (ref 70–99)
Potassium: 4.2 mmol/L (ref 3.5–5.1)
Sodium: 140 mmol/L (ref 135–145)

## 2021-11-20 ENCOUNTER — Other Ambulatory Visit: Payer: Self-pay | Admitting: Oncology

## 2021-12-07 ENCOUNTER — Inpatient Hospital Stay: Payer: Medicare Other | Attending: Hematology and Oncology

## 2021-12-07 ENCOUNTER — Other Ambulatory Visit: Payer: Self-pay | Admitting: Hematology and Oncology

## 2021-12-07 ENCOUNTER — Other Ambulatory Visit: Payer: Self-pay

## 2021-12-07 ENCOUNTER — Inpatient Hospital Stay: Payer: Medicare Other

## 2021-12-07 VITALS — BP 142/70 | HR 59 | Temp 97.7°F | Resp 18 | Wt 207.5 lb

## 2021-12-07 DIAGNOSIS — C50121 Malignant neoplasm of central portion of right male breast: Secondary | ICD-10-CM | POA: Diagnosis present

## 2021-12-07 DIAGNOSIS — Z79810 Long term (current) use of selective estrogen receptor modulators (SERMs): Secondary | ICD-10-CM | POA: Insufficient documentation

## 2021-12-07 DIAGNOSIS — Z5112 Encounter for antineoplastic immunotherapy: Secondary | ICD-10-CM | POA: Diagnosis not present

## 2021-12-07 DIAGNOSIS — Z170 Estrogen receptor positive status [ER+]: Secondary | ICD-10-CM

## 2021-12-07 DIAGNOSIS — Z9011 Acquired absence of right breast and nipple: Secondary | ICD-10-CM | POA: Insufficient documentation

## 2021-12-07 LAB — CBC WITH DIFFERENTIAL (CANCER CENTER ONLY)
Abs Immature Granulocytes: 0.04 10*3/uL (ref 0.00–0.07)
Basophils Absolute: 0.1 10*3/uL (ref 0.0–0.1)
Basophils Relative: 1 %
Eosinophils Absolute: 0.3 10*3/uL (ref 0.0–0.5)
Eosinophils Relative: 3 %
HCT: 38.3 % — ABNORMAL LOW (ref 39.0–52.0)
Hemoglobin: 13.3 g/dL (ref 13.0–17.0)
Immature Granulocytes: 0 %
Lymphocytes Relative: 26 %
Lymphs Abs: 2.4 10*3/uL (ref 0.7–4.0)
MCH: 31.1 pg (ref 26.0–34.0)
MCHC: 34.7 g/dL (ref 30.0–36.0)
MCV: 89.5 fL (ref 80.0–100.0)
Monocytes Absolute: 0.8 10*3/uL (ref 0.1–1.0)
Monocytes Relative: 9 %
Neutro Abs: 5.4 10*3/uL (ref 1.7–7.7)
Neutrophils Relative %: 61 %
Platelet Count: 316 10*3/uL (ref 150–400)
RBC: 4.28 MIL/uL (ref 4.22–5.81)
RDW: 13.2 % (ref 11.5–15.5)
WBC Count: 9 10*3/uL (ref 4.0–10.5)
nRBC: 0 % (ref 0.0–0.2)

## 2021-12-07 LAB — CMP (CANCER CENTER ONLY)
ALT: 10 U/L (ref 0–44)
AST: 13 U/L — ABNORMAL LOW (ref 15–41)
Albumin: 3.6 g/dL (ref 3.5–5.0)
Alkaline Phosphatase: 43 U/L (ref 38–126)
Anion gap: 7 (ref 5–15)
BUN: 16 mg/dL (ref 8–23)
CO2: 25 mmol/L (ref 22–32)
Calcium: 8.8 mg/dL — ABNORMAL LOW (ref 8.9–10.3)
Chloride: 109 mmol/L (ref 98–111)
Creatinine: 1.27 mg/dL — ABNORMAL HIGH (ref 0.61–1.24)
GFR, Estimated: 55 mL/min — ABNORMAL LOW (ref >60)
Glucose, Bld: 97 mg/dL (ref 70–99)
Potassium: 4.1 mmol/L (ref 3.5–5.1)
Sodium: 141 mmol/L (ref 135–145)
Total Bilirubin: 0.4 mg/dL (ref 0.3–1.2)
Total Protein: 6.4 g/dL — ABNORMAL LOW (ref 6.5–8.1)

## 2021-12-07 MED ORDER — ACETAMINOPHEN 325 MG PO TABS
650.0000 mg | ORAL_TABLET | Freq: Once | ORAL | Status: AC
  Administered 2021-12-07: 650 mg via ORAL
  Filled 2021-12-07: qty 2

## 2021-12-07 MED ORDER — SODIUM CHLORIDE 0.9 % IV SOLN: Freq: Once | INTRAVENOUS | Status: AC

## 2021-12-07 MED ORDER — DIPHENHYDRAMINE HCL 25 MG PO CAPS
50.0000 mg | ORAL_CAPSULE | Freq: Once | ORAL | Status: AC
  Administered 2021-12-07: 50 mg via ORAL
  Filled 2021-12-07: qty 2

## 2021-12-07 MED ORDER — TRASTUZUMAB-ANNS CHEMO 150 MG IV SOLR
6.0000 mg/kg | Freq: Once | INTRAVENOUS | Status: AC
  Administered 2021-12-07: 546 mg via INTRAVENOUS
  Filled 2021-12-07: qty 26

## 2021-12-07 NOTE — Patient Instructions (Signed)
[facility]  Discharge Instructions: Thank you for choosing [facility] to provide your oncology and hematology care.   If you have a lab appointment with the [facility], please go directly to the [facility] and check in at the registration area.   Wear comfortable clothing and clothing appropriate for easy access to any Portacath or PICC line.   We strive to give you quality time with your provider. You may need to reschedule your appointment if you arrive late (15 or more minutes).  Arriving late affects you and other patients whose appointments are after yours.  Also, if you miss three or more appointments without notifying the office, you may be dismissed from the clinic at the provider's discretion.      For prescription refill requests, have your pharmacy contact our office and allow 72 hours for refills to be completed.    Today you received the following chemotherapy and/or immunotherapy agents: trastuzumab-anns (Herceptin)      To help prevent nausea and vomiting after your treatment, we encourage you to take your nausea medication as directed.  BELOW ARE SYMPTOMS THAT SHOULD BE REPORTED IMMEDIATELY: *FEVER GREATER THAN 100.4 F (38 C) OR HIGHER *CHILLS OR SWEATING *NAUSEA AND VOMITING THAT IS NOT CONTROLLED WITH YOUR NAUSEA MEDICATION *UNUSUAL SHORTNESS OF BREATH *UNUSUAL BRUISING OR BLEEDING *URINARY PROBLEMS (pain or burning when urinating, or frequent urination) *BOWEL PROBLEMS (unusual diarrhea, constipation, pain near the anus) TENDERNESS IN MOUTH AND THROAT WITH OR WITHOUT PRESENCE OF ULCERS (sore throat, sores in mouth, or a toothache) UNUSUAL RASH, SWELLING OR PAIN  UNUSUAL VAGINAL DISCHARGE OR ITCHING   Items with * indicate a potential emergency and should be followed up as soon as possible or go to the Emergency Department if any problems should occur.  Please show the CHEMOTHERAPY ALERT CARD or IMMUNOTHERAPY ALERT  CARD at check-in to the Emergency Department and triage nurse.  Should you have questions after your visit or need to cancel or reschedule your appointment, please contact [facility]  Dept: [phone]  and follow the prompts.  Office hours are 8:00 a.m. to 4:30 p.m. Monday - Friday. Please note that voicemails left after 4:00 p.m. may not be returned until the following business day.  We are closed weekends and major holidays. You have access to a nurse at all times for urgent questions. Please call the main number to the clinic Dept: [phone] and follow the prompts.   For any non-urgent questions, you may also contact your provider using MyChart. We now offer e-Visits for anyone 45 and older to request care online for non-urgent symptoms. For details visit [email or web address].   Also download the MyChart app! Go to the app store, search "MyChart", open the app, select , and log in with your MyChart username and password.  Due to Covid, a mask is required upon entering the hospital/clinic. If you do not have a mask, one will be given to you upon arrival. For doctor visits, patients may have 1 support Terrance Powell aged 44 or older with them. For treatment visits, patients cannot have anyone with them due to current Covid guidelines and our immunocompromised population.

## 2021-12-08 ENCOUNTER — Other Ambulatory Visit: Payer: Self-pay

## 2021-12-15 ENCOUNTER — Other Ambulatory Visit: Payer: Self-pay

## 2021-12-21 NOTE — Progress Notes (Signed)
Patient Care Team: Orpah Melter, MD as PCP - General (Family Medicine) [name], MD as Consulting Physician (General Surgery) Rockwell Germany, RN as Oncology Nurse Navigator Mauro Kaufmann, RN as Oncology Nurse Navigator Gery Pray, MD as Consulting Physician (Radiation Oncology)  DIAGNOSIS:  Encounter Diagnosis  Name Primary?   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, estrogen receptor positive ([place])  11/28/2020 Initial Diagnosis   Right central breast (retroareolar) biopsy 11/28/2020 for a clinical T2-T4, N0, stage IIA-IIIB invasive ductal carcinoma, ER positive PR negative, HER2 positive, with a Ki-67 of 15%   12/15/2020 Cancer Staging   Staging form: Breast, AJCC 8th Edition - Clinical: Stage IIIB (cT4, cN0, cM0, G2, ER+, PR-, HER2+) - Signed by Chauncey Cruel, MD on 12/15/2020 Histologic grading system: 3 grade system   12/30/2020 Surgery   Right mastectomy 12/30/2020 for a pT2, pN0, stage IIA invasive ductal carcinoma, grade 2, with negative margins 0/1 lymph node negative   01/22/2021 -  Chemotherapy   Patient is on Treatment Plan : BREAST Trastuzumab q21d     02/13/2021 Genetic Testing   Negative hereditary cancer genetic testing: no pathogenic variants detected in [facility] CancerNext-Expanded +RNAinsight Panel.  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  The report date is February 13, 2021.   The CancerNext-Expanded gene panel offered by [facility] and includes sequencing, rearrangement, and RNA analysis for the following 77 genes: AIP, ALK, APC, ATM, AXIN2, BAP1, BARD1, BLM, BMPR1A, BRCA1, BRCA2, BRIP1, CDC73, CDH1, CDK4, CDKN1B, CDKN2A, CHEK2, CTNNA1, DICER1, FANCC, FH, FLCN, GALNT12, KIF1B, LZTR1, MAX, MEN1, MET, MLH1, MSH2, MSH3, MSH6, MUTYH, NBN, NF1,  NF2, NTHL1, PALB2, PHOX2B, PMS2, POT1, PRKAR1A, PTCH1, PTEN, RAD51C, RAD51D, RB1, RECQL, RET, SDHA, SDHAF2, SDHB, SDHC, SDHD, SMAD4, SMARCA4, SMARCB1, SMARCE1, STK11, SUFU, TMEM127, TP53, TSC1, TSC2, VHL and XRCC2 (sequencing and deletion/duplication); EGFR, EGLN1, HOXB13, KIT, MITF, PDGFRA, POLD1, and POLE (sequencing only); EPCAM and GREM1 (deletion/duplication only).    03/09/2021 -  Anti-estrogen oral therapy   tamoxifen started 03/09/2021     CHIEF COMPLIANT:  Herceptin and Tamoxifen follow-up  INTERVAL HISTORY: Terrance Powell is a 87 y.o. with above-mentioned history of estrogen receptor and Her2 positive breast cancer. He presents to the clinic today for follow-up. He states that he is very sleepy after treatment. Denies pain.   ALLERGIES:  is allergic to penicillins and jardiance [empagliflozin].  MEDICATIONS:  Current Outpatient Medications  Medication Sig Dispense Refill   carvedilol (COREG) 3.125 MG tablet Take 2 tablets (6.25 mg total) by mouth 2 (two) times daily. 180 tablet 1   empagliflozin (JARDIANCE) 10 MG TABS tablet 1 tablet     polyethylene glycol (MIRALAX) 17 g packet Take 17 g by mouth daily. 14 each 0   sacubitril-valsartan (ENTRESTO) 97-103 MG Take 1 tablet by mouth 2 (two) times daily. 180 tablet 1   tamoxifen (NOLVADEX) 20 MG tablet Take 20 mg by mouth daily.     No current facility-administered medications for this visit.    PHYSICAL EXAMINATION: ECOG PERFORMANCE STATUS: 1 - Symptomatic but completely ambulatory  Vitals:   12/30/21 0901  BP: (!) 158/72  Pulse: 60  Resp: 16  Temp: (!) 97.5 F (36.4 C)  SpO2: 100%   Filed Weights   12/30/21 0901  Weight: 211 lb 3.2 oz (95.8 kg)  LABORATORY DATA:  I have reviewed the data as listed    Latest Ref Rng & Units 12/30/2021    8:25 AM 12/07/2021    8:31 AM 11/10/2021    8:09 AM  CMP  Glucose 70 - 99 mg/dL 105  97  96   BUN 8 - 23 mg/dL 16  16  15    Creatinine 0.61 - 1.24 mg/dL 1.22  1.27  1.42    Sodium 135 - 145 mmol/L 140  141  140   Potassium 3.5 - 5.1 mmol/L 3.9  4.1  4.2   Chloride 98 - 111 mmol/L 110  109  110   CO2 22 - 32 mmol/L 25  25  24    Calcium 8.9 - 10.3 mg/dL 8.9  8.8  8.9   Total Protein 6.5 - 8.1 g/dL 6.6  6.4    Total Bilirubin 0.3 - 1.2 mg/dL 0.5  0.4    Alkaline Phos 38 - 126 U/L 45  43    AST 15 - 41 U/L 13  13    ALT 0 - 44 U/L 10  10      Lab Results  Component Value Date   WBC 7.7 12/30/2021   HGB 13.0 12/30/2021   HCT 36.8 (L) 12/30/2021   MCV 89.1 12/30/2021   PLT 228 12/30/2021   NEUTROABS 4.6 12/30/2021    ASSESSMENT & PLAN:  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, estrogen receptor positive ([place]) 11/28/2020: Right central breast (retroareolar) biopsy 11/28/2020 for a clinical T2-T4, N0, stage IIA-IIIB invasive ductal carcinoma, ER positive PR negative, HER2 positive, with a Ki-67 of 15%   2018: History of stage I colon cancer   12/30/2020:Right mastectomy 12/30/2020 for a pT2, pN0, stage IIA invasive ductal carcinoma, grade 2, with negative margins 0/1 lymph node negative   Current treatment: Trastuzumab started 01/22/2021, tamoxifen started 03/09/2021  Tamoxifen toxicities: Tolerating the treatment extremely well  Denies any hot flashes or arthralgias or myalgias.   Echocardiogram 04/07/2021: EF 50 to 55% Echocardiogram 07/02/2021: EF 45 to 50% (okay to treat as per cardiology guidance)  Echocardiogram 10/27/2021: EF 45 to 50% echocardiogram 12/28/2021: EF 50 to 55%   Slight rash on the chest probably related to Jardiance   Because of prior delays with his infusions his last treatment with Herceptin will be at the end of September.    No orders of the defined types were placed in this encounter.  The patient has a good understanding of the overall plan. he agrees with it. he will call with any problems that may develop before the next visit here. Total time spent: 30 mins including face to face time and time spent  for planning, charting and co-ordination of care   Harriette Ohara, MD 12/30/21  I Gardiner Coins am scribing for Dr. Lindi Adie  I have reviewed the above documentation for accuracy and completeness, and I agree with the above.

## 2021-12-27 NOTE — Progress Notes (Signed)
CARDIO-ONCOLOGY CLINIC NOTE  Referring Physician: Dr. Lindi Adie Primary Care: Orpah Melter, MD Primary Cardiologist: None   HPI:  [name] is an  87 y.o. male with HTN,  CKD 3a, previous colon CA and right breast cancer referred by Dr. Lindi Adie for enrollment into the Cardio-Oncology program due to possible Herceptin cardiotoxicity.   Diagnosed with R breast cancer in 7/22. Had R mastectomy in 8/22. Stage IIa ER+, HER2 +, PR-  Started therapy with Herceptin q21 days in 9/22  EF 2/23 EF  45-50% GLS -14.5 (I felt 50-55%) Echo 11/22 EF 50-55% GLS -20.7  Echo 8/22 55-60% GLS -21.9  Zio 3.23 6.2% PVCs  Echo 4/23 EF ~45% (definitely lower since previous) Herceptin held Echo 6/23: EF 45-50% G1DD GLS -19.7% Herceptin restarted  Here for f/u. At last visit switched lisinopril to [facility] due to HTN. Herceptin restarted.Has gotten 2 doses of Herceptin since restarting. Feels good. No SOB, orthopnea or PND.  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     polyethylene glycol (MIRALAX) 17 g packet Take 17 g by mouth daily. 14 each 0   sacubitril-valsartan (ENTRESTO) 49-51 MG Take 1 tablet by mouth 2 (two) times daily. 60 tablet 3   tamoxifen (NOLVADEX) 20 MG tablet Take 20 mg by mouth daily.     No current facility-administered medications for this encounter.    Allergies  Allergen Reactions   Penicillins Diarrhea, Itching and Other (See Comments)    TESTICULAR PAIN  Has patient had a PCN reaction causing  immediate rash, facial/tongue/throat swelling, SOB or lightheadedness with hypotension: No SEVERE RASH INVOLVING MUCUS MEMBRANES or SKIN NECROSIS: #  #  #  YES  #  #  #  Has patient had a PCN reaction that required hospitalization No Has patient had a PCN reaction occurring within the last 10 years: No.    Jardiance [Empagliflozin] Other (See Comments)    Constipation       Social History   Socioeconomic History   Marital status: Married    Spouse name: Not on file   Number of children: Not on file   Years of education: Not on file   Highest education level: Not on file  Occupational History   Not on file  Tobacco Use   Smoking status: Former   Smokeless tobacco: Never   Tobacco comments:    "toyed with " none in over 40 years"  Vaping Use   Vaping Use: Never used  Substance and Sexual Activity   Alcohol use: Yes    Comment: 09/08/2016 "might have a couple drinks on holidays"   Drug use: No   Sexual activity: Not Currently  Other Topics Concern   Not on file  Social History Narrative   Not on file   Social Determinants of Health   Financial Resource Strain: Not on file  Food Insecurity: Not on file  Transportation Needs: Not on file  Physical Activity: Not on  file  Stress: Not on file  Social Connections: Not on file  Intimate Partner Violence: Not on file      Family History  Problem Relation Age of Onset   Breast cancer Mother        dx 60s-70s   Colon cancer Father 74       metastatic   Breast cancer Maternal Aunt        x2 maternal aunts; dx 50s   Cancer Paternal Aunt        unknown type; d. 50s   Esophageal cancer Paternal Uncle        d. 50s   Cancer Paternal Uncle        unknown type; d. 50s   Ovarian cancer Paternal Grandmother        d. late 30s-early 40s    Vitals:   12/28/21 0931  BP: (!) 150/88  Pulse: (!) 46  SpO2: 97%  Weight: 95.3 kg (210 lb)   PHYSICAL EXAM: General:  Well appearing. No resp difficulty HEENT: normal Neck:  supple. no JVD. Carotids 2+ bilat; no bruits. No lymphadenopathy or thryomegaly appreciated. Cor: PMI nondisplaced. Regular rate & rhythm. No rubs, gallops or murmurs. Lungs: clear Abdomen: soft, nontender, nondistended. No hepatosplenomegaly. No bruits or masses. Good bowel sounds. Extremities: no cyanosis, clubbing, rash, edema Neuro: alert & orientedx3, cranial nerves grossly intact. moves all 4 extremities w/o difficulty. Affect pleasant  ECG SB 53 1AVB (PR 206 ms) Personally reviewed   ASSESSMENT & PLAN:  1. Right Breast Cancer - Diagnosed 7/22. - R mastectomy in 8/22. Stage IIa ER+, HER2 +, PR-   - Started therapy with Herceptin q21 days in 9/22 - Herceptin held 4/23 x 3 doses - Echo 6/23 45-50%. GLS normal -> Herceptin restarted - Echo today 12/28/21: EF 50-55% GLS -19.1% -> continue Herceptin - Repeat echo 3 months.  - Finishes Herceptin in November  2. Chemo-induced cardiotoxicty with mild systolic HF - Echo 11/22 EF 50-55% GLS -20.7  - Echo 8/22 55-60% GLS -21.9 - Echo 2/23 45-50% GLS -14.5 (I felt 50-55%) - Echo 09/03/21 EF ~45% -> Herceptin held  - Echo 6/23: EF 45-50% G1DD GLS -19.7%  - Echo today 12/28/21: EF 50-55% GLS -19.1%  - Suspect mild chemo-induced cardiotoxicity. Herceptin held 4/23. Restarted 6/23 - Tolerating Herceptin well. Will continue - Cut carvedilol to 3.125 bid due to bradycardia - Increase Entresto to 97/103 bid - Failed Jardiance due to rash. Can rechallenge with Farxiga down the road  - Will avoid spiro for now with estrogen effect and breast CA  3. HTN  - Blood pressure high.  - Meds as above  4. PVCs on ECG - Zio 3/23 6.2% PVCs. May be contributing to cardiomyopathy - Cut carvedilol - Has not completed home sleep study  5. Bradycardia - continue to follow - doesn't qualify for PPM at this point - cut carvedilol dose back   Daniel Bensimhon, MD  9:39 AM  

## 2021-12-28 ENCOUNTER — Ambulatory Visit (HOSPITAL_COMMUNITY)
Admission: RE | Admit: 2021-12-28 | Discharge: 2021-12-28 | Disposition: A | Discharge status: Home / Self Care | Payer: Medicare Other | Source: Ambulatory Visit | Attending: Internal Medicine | Admitting: Internal Medicine

## 2021-12-28 ENCOUNTER — Encounter (HOSPITAL_COMMUNITY): Payer: Self-pay | Admitting: Internal Medicine

## 2021-12-28 ENCOUNTER — Ambulatory Visit (HOSPITAL_BASED_OUTPATIENT_CLINIC_OR_DEPARTMENT_OTHER)
Admission: RE | Admit: 2021-12-28 | Discharge: 2021-12-28 | Disposition: A | Discharge status: Home / Self Care | Payer: Medicare Other | Source: Ambulatory Visit | Attending: Internal Medicine | Admitting: Internal Medicine

## 2021-12-28 VITALS — BP 150/88 | HR 46 | Wt 210.0 lb

## 2021-12-28 DIAGNOSIS — Z9011 Acquired absence of right breast and nipple: Secondary | ICD-10-CM | POA: Diagnosis not present

## 2021-12-28 DIAGNOSIS — I427 Cardiomyopathy due to drug and external agent: Secondary | ICD-10-CM

## 2021-12-28 DIAGNOSIS — I5022 Chronic systolic (congestive) heart failure: Secondary | ICD-10-CM | POA: Diagnosis not present

## 2021-12-28 DIAGNOSIS — Z79899 Other long term (current) drug therapy: Secondary | ICD-10-CM | POA: Insufficient documentation

## 2021-12-28 DIAGNOSIS — R001 Bradycardia, unspecified: Secondary | ICD-10-CM

## 2021-12-28 DIAGNOSIS — N1831 Chronic kidney disease, stage 3a: Secondary | ICD-10-CM | POA: Insufficient documentation

## 2021-12-28 DIAGNOSIS — I10 Essential (primary) hypertension: Secondary | ICD-10-CM

## 2021-12-28 DIAGNOSIS — I428 Other cardiomyopathies: Secondary | ICD-10-CM | POA: Diagnosis not present

## 2021-12-28 DIAGNOSIS — C50121 Malignant neoplasm of central portion of right male breast: Secondary | ICD-10-CM

## 2021-12-28 DIAGNOSIS — Z0189 Encounter for other specified special examinations: Secondary | ICD-10-CM

## 2021-12-28 DIAGNOSIS — I493 Ventricular premature depolarization: Secondary | ICD-10-CM | POA: Diagnosis not present

## 2021-12-28 DIAGNOSIS — T451X5A Adverse effect of antineoplastic and immunosuppressive drugs, initial encounter: Secondary | ICD-10-CM

## 2021-12-28 DIAGNOSIS — Z85038 Personal history of other malignant neoplasm of large intestine: Secondary | ICD-10-CM | POA: Diagnosis not present

## 2021-12-28 DIAGNOSIS — I130 Hypertensive heart and chronic kidney disease with heart failure and stage 1 through stage 4 chronic kidney disease, or unspecified chronic kidney disease: Secondary | ICD-10-CM | POA: Diagnosis not present

## 2021-12-28 DIAGNOSIS — Z170 Estrogen receptor positive status [ER+]: Secondary | ICD-10-CM | POA: Diagnosis not present

## 2021-12-28 LAB — ECHOCARDIOGRAM COMPLETE
AR max vel: 1.92 cm2
AV Area VTI: 1.9 cm2
AV Area mean vel: 1.84 cm2
AV Mean grad: 4 mmHg
AV Peak grad: 8.3 mmHg
Ao pk vel: 1.44 m/s
Area-P 1/2: 1.92 cm2
Calc EF: 56.3 %
P 1/2 time: 1044 msec
S' Lateral: 3.8 cm
Single Plane A2C EF: 60.1 %
Single Plane A4C EF: 53.2 %

## 2021-12-28 MED ORDER — ENTRESTO 97-103 MG PO TABS: 1.0000 | ORAL_TABLET | Freq: Two times a day (BID) | ORAL | 1 refills | Status: DC

## 2021-12-28 MED ORDER — CARVEDILOL 3.125 MG PO TABS: 6.2500 mg | ORAL_TABLET | Freq: Two times a day (BID) | ORAL | 1 refills | Status: DC

## 2021-12-28 NOTE — Addendum Note (Signed)
Encounter addended by: Stanford Scotland, RN on: 12/28/2021 10:11 AM  Actions taken: Pharmacy for encounter modified, Visit diagnoses modified, Order list changed, Diagnosis association updated, Clinical Note Signed

## 2021-12-28 NOTE — Patient Instructions (Signed)
Good to see you today  INCREASE Entresto to 97/103 mg Twice daily  DECREASE Coreg to 3.125 mg Twice daily  Your physician has requested that you have an echocardiogram. Echocardiography is a painless test that uses sound waves to create images of your heart. It provides your doctor with information about the size and shape of your heart and how well your heart's chambers and valves are working. This procedure takes approximately one hour. There are no restrictions for this procedure.  Your physician recommends that you schedule a follow-up appointment in: 3 months with echo   If you have any questions or concerns before your next appointment please send [place] a message through [place] or call our office at [phone].    TO LEAVE A MESSAGE FOR THE NURSE SELECT OPTION 2, PLEASE LEAVE A MESSAGE INCLUDING: YOUR NAME DATE OF BIRTH CALL BACK NUMBER REASON FOR CALL**this is important as we prioritize the call backs  YOU WILL RECEIVE A CALL BACK THE SAME DAY AS LONG AS YOU CALL BEFORE 4:00 PM  At the [facility], you and your health needs are our priority. As part of our continuing mission to provide you with exceptional heart care, we have created designated Provider Care Teams. These Care Teams include your primary Cardiologist (physician) and Advanced Practice Providers (APPs- Physician Assistants and Nurse Practitioners) who all work together to provide you with the care you need, when you need it.   You may see any of the following providers on your designated Care Team at your next follow up: Dr Glori Bickers Dr Haynes Kerns, NP Lyda Jester, [place] [facility] [place], [place] Audry Riles, PharmD   Please be sure to bring in all your medications bottles to every appointment.

## 2021-12-28 NOTE — Progress Notes (Signed)
  Echocardiogram 2D Echocardiogram has been performed.  Terrance Powell F 12/28/2021, 9:09 AM

## 2021-12-30 ENCOUNTER — Inpatient Hospital Stay: Payer: Medicare Other

## 2021-12-30 ENCOUNTER — Inpatient Hospital Stay: Payer: Medicare Other | Attending: Oncology | Admitting: Hematology and Oncology

## 2021-12-30 ENCOUNTER — Other Ambulatory Visit: Payer: Self-pay

## 2021-12-30 ENCOUNTER — Encounter: Payer: Self-pay | Admitting: *Deleted

## 2021-12-30 VITALS — BP 148/70 | HR 62 | Temp 98.2°F | Resp 18

## 2021-12-30 DIAGNOSIS — C50121 Malignant neoplasm of central portion of right male breast: Secondary | ICD-10-CM

## 2021-12-30 DIAGNOSIS — Z170 Estrogen receptor positive status [ER+]: Secondary | ICD-10-CM | POA: Insufficient documentation

## 2021-12-30 DIAGNOSIS — Z9011 Acquired absence of right breast and nipple: Secondary | ICD-10-CM | POA: Insufficient documentation

## 2021-12-30 DIAGNOSIS — Z5112 Encounter for antineoplastic immunotherapy: Secondary | ICD-10-CM | POA: Diagnosis not present

## 2021-12-30 LAB — CBC WITH DIFFERENTIAL (CANCER CENTER ONLY)
Abs Immature Granulocytes: 0.01 10*3/uL (ref 0.00–0.07)
Basophils Absolute: 0.1 10*3/uL (ref 0.0–0.1)
Basophils Relative: 1 %
Eosinophils Absolute: 0.2 10*3/uL (ref 0.0–0.5)
Eosinophils Relative: 3 %
HCT: 36.8 % — ABNORMAL LOW (ref 39.0–52.0)
Hemoglobin: 13 g/dL (ref 13.0–17.0)
Immature Granulocytes: 0 %
Lymphocytes Relative: 28 %
Lymphs Abs: 2.1 10*3/uL (ref 0.7–4.0)
MCH: 31.5 pg (ref 26.0–34.0)
MCHC: 35.3 g/dL (ref 30.0–36.0)
MCV: 89.1 fL (ref 80.0–100.0)
Monocytes Absolute: 0.7 10*3/uL (ref 0.1–1.0)
Monocytes Relative: 10 %
Neutro Abs: 4.6 10*3/uL (ref 1.7–7.7)
Neutrophils Relative %: 58 %
Platelet Count: 228 10*3/uL (ref 150–400)
RBC: 4.13 MIL/uL — ABNORMAL LOW (ref 4.22–5.81)
RDW: 13.7 % (ref 11.5–15.5)
WBC Count: 7.7 10*3/uL (ref 4.0–10.5)
nRBC: 0 % (ref 0.0–0.2)

## 2021-12-30 LAB — CMP (CANCER CENTER ONLY)
ALT: 10 U/L (ref 0–44)
AST: 13 U/L — ABNORMAL LOW (ref 15–41)
Albumin: 3.7 g/dL (ref 3.5–5.0)
Alkaline Phosphatase: 45 U/L (ref 38–126)
Anion gap: 5 (ref 5–15)
BUN: 16 mg/dL (ref 8–23)
CO2: 25 mmol/L (ref 22–32)
Calcium: 8.9 mg/dL (ref 8.9–10.3)
Chloride: 110 mmol/L (ref 98–111)
Creatinine: 1.22 mg/dL (ref 0.61–1.24)
GFR, Estimated: 58 mL/min — ABNORMAL LOW (ref >60)
Glucose, Bld: 105 mg/dL — ABNORMAL HIGH (ref 70–99)
Potassium: 3.9 mmol/L (ref 3.5–5.1)
Sodium: 140 mmol/L (ref 135–145)
Total Bilirubin: 0.5 mg/dL (ref 0.3–1.2)
Total Protein: 6.6 g/dL (ref 6.5–8.1)

## 2021-12-30 MED ORDER — TRASTUZUMAB-ANNS CHEMO 150 MG IV SOLR
6.0000 mg/kg | Freq: Once | INTRAVENOUS | Status: AC
  Administered 2021-12-30: 546 mg via INTRAVENOUS
  Filled 2021-12-30: qty 26

## 2021-12-30 MED ORDER — SODIUM CHLORIDE 0.9 % IV SOLN: Freq: Once | INTRAVENOUS | Status: AC

## 2021-12-30 MED ORDER — DIPHENHYDRAMINE HCL 25 MG PO CAPS
50.0000 mg | ORAL_CAPSULE | Freq: Once | ORAL | Status: AC
  Administered 2021-12-30: 50 mg via ORAL

## 2021-12-30 MED ORDER — ACETAMINOPHEN 325 MG PO TABS
650.0000 mg | ORAL_TABLET | Freq: Once | ORAL | Status: AC
  Administered 2021-12-30: 650 mg via ORAL

## 2021-12-30 NOTE — Patient Instructions (Signed)
[facility] ONCOLOGY  Discharge Instructions: Thank you for choosing [facility] to provide your oncology and hematology care.   If you have a lab appointment with the [facility], please go directly to the [facility] and check in at the registration area.   Wear comfortable clothing and clothing appropriate for easy access to any Portacath or PICC line.   We strive to give you quality time with your provider. You may need to reschedule your appointment if you arrive late (15 or more minutes).  Arriving late affects you and other patients whose appointments are after yours.  Also, if you miss three or more appointments without notifying the office, you may be dismissed from the clinic at the provider's discretion.      For prescription refill requests, have your pharmacy contact our office and allow 72 hours for refills to be completed.    Today you received the following chemotherapy and/or immunotherapy agents: trastuzumab-anns (Herceptin)      To help prevent nausea and vomiting after your treatment, we encourage you to take your nausea medication as directed.  BELOW ARE SYMPTOMS THAT SHOULD BE REPORTED IMMEDIATELY: *FEVER GREATER THAN 100.4 F (38 C) OR HIGHER *CHILLS OR SWEATING *NAUSEA AND VOMITING THAT IS NOT CONTROLLED WITH YOUR NAUSEA MEDICATION *UNUSUAL SHORTNESS OF BREATH *UNUSUAL BRUISING OR BLEEDING *URINARY PROBLEMS (pain or burning when urinating, or frequent urination) *BOWEL PROBLEMS (unusual diarrhea, constipation, pain near the anus) TENDERNESS IN MOUTH AND THROAT WITH OR WITHOUT PRESENCE OF ULCERS (sore throat, sores in mouth, or a toothache) UNUSUAL RASH, SWELLING OR PAIN  UNUSUAL VAGINAL DISCHARGE OR ITCHING   Items with * indicate a potential emergency and should be followed up as soon as possible or go to the Emergency Department if any problems should occur.  Please show the CHEMOTHERAPY ALERT CARD or IMMUNOTHERAPY ALERT  CARD at check-in to the Emergency Department and triage nurse.  Should you have questions after your visit or need to cancel or reschedule your appointment, please contact [facility]  Dept: [phone]  and follow the prompts.  Office hours are 8:00 a.m. to 4:30 p.m. Monday - Friday. Please note that voicemails left after 4:00 p.m. may not be returned until the following business day.  We are closed weekends and major holidays. You have access to a nurse at all times for urgent questions. Please call the main number to the clinic Dept: [phone] and follow the prompts.   For any non-urgent questions, you may also contact your provider using MyChart. We now offer e-Visits for anyone 35 and older to request care online for non-urgent symptoms. For details visit [email or web address].   Also download the MyChart app! Go to the app store, search "MyChart", open the app, select [facility], and log in with your MyChart username and password.  Due to Covid, a mask is required upon entering the hospital/clinic. If you do not have a mask, one will be given to you upon arrival. For doctor visits, patients may have 1 support person aged 21 or older with them. For treatment visits, patients cannot have anyone with them due to current Covid guidelines and our immunocompromised population.

## 2021-12-30 NOTE — Assessment & Plan Note (Addendum)
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  Echocardiogram 04/07/2021: EF 50 to 55% Echocardiogram 07/02/2021: EF 45 to 50% (okay to treat as per cardiology guidance)  Echocardiogram 10/27/2021: EF 45 to 50% echocardiogram 12/28/2021: EF 50 to 55%   Slight rash on the chest probably related to Jardiance  Because of prior delays with his infusions his last treatment with Herceptin will be at the end of September.

## 2021-12-31 ENCOUNTER — Encounter: Payer: Self-pay | Admitting: Hematology and Oncology

## 2022-01-15 ENCOUNTER — Other Ambulatory Visit: Payer: Self-pay

## 2022-01-20 ENCOUNTER — Other Ambulatory Visit: Payer: Self-pay

## 2022-01-20 ENCOUNTER — Inpatient Hospital Stay: Payer: Medicare Other | Attending: Oncology

## 2022-01-20 VITALS — BP 156/70 | HR 54 | Temp 97.6°F | Resp 18 | Ht 70.0 in | Wt 211.5 lb

## 2022-01-20 DIAGNOSIS — C50121 Malignant neoplasm of central portion of right male breast: Secondary | ICD-10-CM | POA: Insufficient documentation

## 2022-01-20 DIAGNOSIS — Z5112 Encounter for antineoplastic immunotherapy: Secondary | ICD-10-CM | POA: Insufficient documentation

## 2022-01-20 MED ORDER — ACETAMINOPHEN 325 MG PO TABS
650.0000 mg | ORAL_TABLET | Freq: Once | ORAL | Status: AC
  Administered 2022-01-20: 650 mg via ORAL
  Filled 2022-01-20: qty 2

## 2022-01-20 MED ORDER — SODIUM CHLORIDE 0.9 % IV SOLN: Freq: Once | INTRAVENOUS | Status: AC

## 2022-01-20 MED ORDER — DIPHENHYDRAMINE HCL 25 MG PO CAPS
50.0000 mg | ORAL_CAPSULE | Freq: Once | ORAL | Status: AC
  Administered 2022-01-20: 50 mg via ORAL
  Filled 2022-01-20: qty 2

## 2022-01-20 MED ORDER — TRASTUZUMAB-ANNS CHEMO 150 MG IV SOLR
6.0000 mg/kg | Freq: Once | INTRAVENOUS | Status: AC
  Administered 2022-01-20: 546 mg via INTRAVENOUS
  Filled 2022-01-20: qty 26

## 2022-01-20 NOTE — Patient Instructions (Signed)
[facility] ONCOLOGY  Discharge Instructions: Thank you for choosing [facility] to provide your oncology and hematology care.   If you have a lab appointment with the [facility], please go directly to the [facility] and check in at the registration area.   Wear comfortable clothing and clothing appropriate for easy access to any Portacath or PICC line.   We strive to give you quality time with your provider. You may need to reschedule your appointment if you arrive late (15 or more minutes).  Arriving late affects you and other patients whose appointments are after yours.  Also, if you miss three or more appointments without notifying the office, you may be dismissed from the clinic at the provider's discretion.      For prescription refill requests, have your pharmacy contact our office and allow 72 hours for refills to be completed.    Today you received the following chemotherapy and/or immunotherapy agents: trastuzumab-anns (Herceptin)      To help prevent nausea and vomiting after your treatment, we encourage you to take your nausea medication as directed.  BELOW ARE SYMPTOMS THAT SHOULD BE REPORTED IMMEDIATELY: *FEVER GREATER THAN 100.4 F (38 C) OR HIGHER *CHILLS OR SWEATING *NAUSEA AND VOMITING THAT IS NOT CONTROLLED WITH YOUR NAUSEA MEDICATION *UNUSUAL SHORTNESS OF BREATH *UNUSUAL BRUISING OR BLEEDING *URINARY PROBLEMS (pain or burning when urinating, or frequent urination) *BOWEL PROBLEMS (unusual diarrhea, constipation, pain near the anus) TENDERNESS IN MOUTH AND THROAT WITH OR WITHOUT PRESENCE OF ULCERS (sore throat, sores in mouth, or a toothache) UNUSUAL RASH, SWELLING OR PAIN  UNUSUAL VAGINAL DISCHARGE OR ITCHING   Items with * indicate a potential emergency and should be followed up as soon as possible or go to the Emergency Department if any problems should occur.  Please show the CHEMOTHERAPY ALERT CARD or IMMUNOTHERAPY ALERT  CARD at check-in to the Emergency Department and triage nurse.  Should you have questions after your visit or need to cancel or reschedule your appointment, please contact [facility]  Dept: [phone]  and follow the prompts.  Office hours are 8:00 a.m. to 4:30 p.m. Monday - Friday. Please note that voicemails left after 4:00 p.m. may not be returned until the following business day.  We are closed weekends and major holidays. You have access to a nurse at all times for urgent questions. Please call the main number to the clinic Dept: [phone] and follow the prompts.   For any non-urgent questions, you may also contact your provider using MyChart. We now offer e-Visits for anyone 57 and older to request care online for non-urgent symptoms. For details visit [email or web address].   Also download the MyChart app! Go to the app store, search "MyChart", open the app, select [facility], and log in with your MyChart username and password.  Due to Covid, a mask is required upon entering the hospital/clinic. If you do not have a mask, one will be given to you upon arrival. For doctor visits, patients may have 1 support person aged 64 or older with them. For treatment visits, patients cannot have anyone with them due to current Covid guidelines and our immunocompromised population.

## 2022-02-10 ENCOUNTER — Inpatient Hospital Stay: Payer: Medicare Other

## 2022-02-10 ENCOUNTER — Other Ambulatory Visit: Payer: Self-pay

## 2022-02-10 VITALS — BP 151/74 | HR 60 | Temp 97.8°F | Resp 16 | Wt 210.5 lb

## 2022-02-10 DIAGNOSIS — Z5112 Encounter for antineoplastic immunotherapy: Secondary | ICD-10-CM | POA: Diagnosis not present

## 2022-02-10 DIAGNOSIS — Z170 Estrogen receptor positive status [ER+]: Secondary | ICD-10-CM

## 2022-02-10 MED ORDER — ACETAMINOPHEN 325 MG PO TABS
650.0000 mg | ORAL_TABLET | Freq: Once | ORAL | Status: AC
  Administered 2022-02-10: 650 mg via ORAL
  Filled 2022-02-10: qty 2

## 2022-02-10 MED ORDER — TRASTUZUMAB-ANNS CHEMO 150 MG IV SOLR
6.0000 mg/kg | Freq: Once | INTRAVENOUS | Status: AC
  Administered 2022-02-10: 546 mg via INTRAVENOUS
  Filled 2022-02-10: qty 26

## 2022-02-10 MED ORDER — SODIUM CHLORIDE 0.9 % IV SOLN: Freq: Once | INTRAVENOUS | Status: AC

## 2022-02-10 MED ORDER — DIPHENHYDRAMINE HCL 25 MG PO CAPS
50.0000 mg | ORAL_CAPSULE | Freq: Once | ORAL | Status: AC
  Administered 2022-02-10: 50 mg via ORAL
  Filled 2022-02-10: qty 2

## 2022-02-10 NOTE — Patient Instructions (Signed)
[facility] ONCOLOGY  Discharge Instructions: Thank you for choosing [facility] to provide your oncology and hematology care.   If you have a lab appointment with the [facility], please go directly to the [facility] and check in at the registration area.   Wear comfortable clothing and clothing appropriate for easy access to any Portacath or PICC line.   We strive to give you quality time with your provider. You may need to reschedule your appointment if you arrive late (15 or more minutes).  Arriving late affects you and other patients whose appointments are after yours.  Also, if you miss three or more appointments without notifying the office, you may be dismissed from the clinic at the provider's discretion.      For prescription refill requests, have your pharmacy contact our office and allow 72 hours for refills to be completed.    Today you received the following chemotherapy and/or immunotherapy agents: trastuzumab-anns (Herceptin)      To help prevent nausea and vomiting after your treatment, we encourage you to take your nausea medication as directed.  BELOW ARE SYMPTOMS THAT SHOULD BE REPORTED IMMEDIATELY: *FEVER GREATER THAN 100.4 F (38 C) OR HIGHER *CHILLS OR SWEATING *NAUSEA AND VOMITING THAT IS NOT CONTROLLED WITH YOUR NAUSEA MEDICATION *UNUSUAL SHORTNESS OF BREATH *UNUSUAL BRUISING OR BLEEDING *URINARY PROBLEMS (pain or burning when urinating, or frequent urination) *BOWEL PROBLEMS (unusual diarrhea, constipation, pain near the anus) TENDERNESS IN MOUTH AND THROAT WITH OR WITHOUT PRESENCE OF ULCERS (sore throat, sores in mouth, or a toothache) UNUSUAL RASH, SWELLING OR PAIN  UNUSUAL VAGINAL DISCHARGE OR ITCHING   Items with * indicate a potential emergency and should be followed up as soon as possible or go to the Emergency Department if any problems should occur.  Please show the CHEMOTHERAPY ALERT CARD or IMMUNOTHERAPY ALERT  CARD at check-in to the Emergency Department and triage nurse.  Should you have questions after your visit or need to cancel or reschedule your appointment, please contact [facility]  Dept: [phone]  and follow the prompts.  Office hours are 8:00 a.m. to 4:30 p.m. Monday - Friday. Please note that voicemails left after 4:00 p.m. may not be returned until the following business day.  We are closed weekends and major holidays. You have access to a nurse at all times for urgent questions. Please call the main number to the clinic Dept: [phone] and follow the prompts.   For any non-urgent questions, you may also contact your provider using MyChart. We now offer e-Visits for anyone 20 and older to request care online for non-urgent symptoms. For details visit [email or web address].   Also download the MyChart app! Go to the app store, search "MyChart", open the app, select [facility], and log in with your MyChart username and password.  Due to Covid, a mask is required upon entering the hospital/clinic. If you do not have a mask, one will be given to you upon arrival. For doctor visits, patients may have 1 support person aged 79 or older with them. For treatment visits, patients cannot have anyone with them due to current Covid guidelines and our immunocompromised population.

## 2022-03-01 NOTE — Progress Notes (Signed)
Patient Care Team: Orpah Melter, MD as PCP - General (Family Medicine) [name], MD as Consulting Physician (General Surgery) Rockwell Germany, RN as Oncology Nurse Navigator Mauro Kaufmann, RN as Oncology Nurse Navigator Gery Pray, MD as Consulting Physician (Radiation Oncology)  DIAGNOSIS: No diagnosis found.  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, estrogen receptor positive ([place])  11/28/2020 Initial Diagnosis   Right central breast (retroareolar) biopsy 11/28/2020 for a clinical T2-T4, N0, stage IIA-IIIB invasive ductal carcinoma, ER positive PR negative, HER2 positive, with a Ki-67 of 15%   12/15/2020 Cancer Staging   Staging form: Breast, AJCC 8th Edition - Clinical: Stage IIIB (cT4, cN0, cM0, G2, ER+, PR-, HER2+) - Signed by Chauncey Cruel, MD on 12/15/2020 Histologic grading system: 3 grade system   12/30/2020 Surgery   Right mastectomy 12/30/2020 for a pT2, pN0, stage IIA invasive ductal carcinoma, grade 2, with negative margins 0/1 lymph node negative   01/22/2021 -  Chemotherapy   Patient is on Treatment Plan : BREAST Trastuzumab q21d     02/13/2021 Genetic Testing   Negative hereditary cancer genetic testing: no pathogenic variants detected in [facility] CancerNext-Expanded +RNAinsight Panel.  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  The report date is February 13, 2021.   The CancerNext-Expanded gene panel offered by [facility] and includes sequencing, rearrangement, and RNA analysis for the following 77 genes: AIP, ALK, APC, ATM, AXIN2, BAP1, BARD1, BLM, BMPR1A, BRCA1, BRCA2, BRIP1, CDC73, CDH1, CDK4, CDKN1B, CDKN2A, CHEK2, CTNNA1, DICER1, FANCC, FH, FLCN, GALNT12, KIF1B, LZTR1, MAX, MEN1, MET, MLH1, MSH2, MSH3, MSH6, MUTYH, NBN, NF1, NF2, NTHL1, PALB2, PHOX2B, PMS2, POT1, PRKAR1A, PTCH1, PTEN, RAD51C, RAD51D, RB1, RECQL, RET, SDHA, SDHAF2, SDHB, SDHC,  SDHD, SMAD4, SMARCA4, SMARCB1, SMARCE1, STK11, SUFU, TMEM127, TP53, TSC1, TSC2, VHL and XRCC2 (sequencing and deletion/duplication); EGFR, EGLN1, HOXB13, KIT, MITF, PDGFRA, POLD1, and POLE (sequencing only); EPCAM and GREM1 (deletion/duplication only).    03/09/2021 -  Anti-estrogen oral therapy   tamoxifen started 03/09/2021     CHIEF COMPLIANT: Herceptin and Tamoxifen follow-up  INTERVAL HISTORY: Terrance Powell is a 87 y.o. with above-mentioned history of estrogen receptor and Her2 positive breast cancer. He presents to the clinic today for follow-up.    ALLERGIES:  is allergic to penicillins and jardiance [empagliflozin].  MEDICATIONS:  Current Outpatient Medications  Medication Sig Dispense Refill   carvedilol (COREG) 3.125 MG tablet Take 2 tablets (6.25 mg total) by mouth 2 (two) times daily. 180 tablet 1   empagliflozin (JARDIANCE) 10 MG TABS tablet 1 tablet     polyethylene glycol (MIRALAX) 17 g packet Take 17 g by mouth daily. 14 each 0   sacubitril-valsartan (ENTRESTO) 97-103 MG Take 1 tablet by mouth 2 (two) times daily. 180 tablet 1   tamoxifen (NOLVADEX) 20 MG tablet Take 20 mg by mouth daily.     No current facility-administered medications for this visit.    PHYSICAL EXAMINATION: ECOG PERFORMANCE STATUS: {CHL ONC ECOG [phone]}  There were no vitals filed for this visit. There were no vitals filed for this visit.  BREAST:*** No palpable masses or nodules in either right or left breasts. No palpable axillary supraclavicular or infraclavicular adenopathy no breast tenderness or nipple discharge. (exam performed in the presence of a chaperone)  LABORATORY DATA:  I have reviewed the data as listed    Latest Ref Rng & Units 12/30/2021    8:25 AM 12/07/2021  8:31 AM 11/10/2021    8:09 AM  CMP  Glucose 70 - 99 mg/dL 105  97  96   BUN 8 - 23 mg/dL 16  16  15    Creatinine 0.61 - 1.24 mg/dL 1.22  1.27  1.42   Sodium 135 - 145 mmol/L 140  141  140   Potassium 3.5  - 5.1 mmol/L 3.9  4.1  4.2   Chloride 98 - 111 mmol/L 110  109  110   CO2 22 - 32 mmol/L 25  25  24    Calcium 8.9 - 10.3 mg/dL 8.9  8.8  8.9   Total Protein 6.5 - 8.1 g/dL 6.6  6.4    Total Bilirubin 0.3 - 1.2 mg/dL 0.5  0.4    Alkaline Phos 38 - 126 U/L 45  43    AST 15 - 41 U/L 13  13    ALT 0 - 44 U/L 10  10      Lab Results  Component Value Date   WBC 7.7 12/30/2021   HGB 13.0 12/30/2021   HCT 36.8 (L) 12/30/2021   MCV 89.1 12/30/2021   PLT 228 12/30/2021   NEUTROABS 4.6 12/30/2021    ASSESSMENT & PLAN:  No problem-specific Assessment & Plan notes found for this encounter.    No orders of the defined types were placed in this encounter.  The patient has a good understanding of the overall plan. he agrees with it. he will call with any problems that may develop before the next visit here. Total time spent: 30 mins including face to face time and time spent for planning, charting and co-ordination of care   Suzzette Righter, [place] 03/01/22    I Gardiner Coins am scribing for Dr. Lindi Adie  ***

## 2022-03-02 ENCOUNTER — Encounter: Payer: Self-pay | Admitting: *Deleted

## 2022-03-02 DIAGNOSIS — C50121 Malignant neoplasm of central portion of right male breast: Secondary | ICD-10-CM

## 2022-03-02 NOTE — Assessment & Plan Note (Signed)
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  Echocardiogram 04/07/2021: EF 50 to 55% Echocardiogram 07/02/2021: EF 45 to 50% (okay to treat as per cardiology guidance) Echocardiogram 12/28/2021: EF 50 to 55%   Slight rash on the chest probably related to Jardiance  Because of prior delays with his infusions today is his last treatment with Herceptin 

## 2022-03-03 ENCOUNTER — Inpatient Hospital Stay: Payer: Medicare Other | Attending: Oncology | Admitting: Hematology and Oncology

## 2022-03-03 ENCOUNTER — Other Ambulatory Visit: Payer: Self-pay

## 2022-03-03 ENCOUNTER — Inpatient Hospital Stay: Payer: Medicare Other

## 2022-03-03 VITALS — BP 158/61 | HR 61 | Temp 97.7°F | Resp 18

## 2022-03-03 DIAGNOSIS — C50121 Malignant neoplasm of central portion of right male breast: Secondary | ICD-10-CM | POA: Insufficient documentation

## 2022-03-03 DIAGNOSIS — Z5112 Encounter for antineoplastic immunotherapy: Secondary | ICD-10-CM | POA: Insufficient documentation

## 2022-03-03 DIAGNOSIS — Z7984 Long term (current) use of oral hypoglycemic drugs: Secondary | ICD-10-CM | POA: Insufficient documentation

## 2022-03-03 DIAGNOSIS — Z170 Estrogen receptor positive status [ER+]: Secondary | ICD-10-CM | POA: Diagnosis not present

## 2022-03-03 DIAGNOSIS — Z79899 Other long term (current) drug therapy: Secondary | ICD-10-CM | POA: Insufficient documentation

## 2022-03-03 DIAGNOSIS — Z9011 Acquired absence of right breast and nipple: Secondary | ICD-10-CM | POA: Insufficient documentation

## 2022-03-03 DIAGNOSIS — Z79810 Long term (current) use of selective estrogen receptor modulators (SERMs): Secondary | ICD-10-CM | POA: Diagnosis not present

## 2022-03-03 MED ORDER — TRASTUZUMAB-ANNS CHEMO 150 MG IV SOLR
6.0000 mg/kg | Freq: Once | INTRAVENOUS | Status: AC
  Administered 2022-03-03: 546 mg via INTRAVENOUS
  Filled 2022-03-03: qty 26

## 2022-03-03 MED ORDER — DIPHENHYDRAMINE HCL 25 MG PO CAPS
50.0000 mg | ORAL_CAPSULE | Freq: Once | ORAL | Status: AC
  Administered 2022-03-03: 50 mg via ORAL
  Filled 2022-03-03: qty 2

## 2022-03-03 MED ORDER — SODIUM CHLORIDE 0.9 % IV SOLN: Freq: Once | INTRAVENOUS | Status: AC

## 2022-03-03 MED ORDER — ACETAMINOPHEN 325 MG PO TABS
650.0000 mg | ORAL_TABLET | Freq: Once | ORAL | Status: AC
  Administered 2022-03-03: 650 mg via ORAL
  Filled 2022-03-03: qty 2

## 2022-03-03 NOTE — Patient Instructions (Addendum)
[name]  Discharge Instructions: Thank you for choosing [facility] to provide your oncology and hematology care.   If you have a lab appointment with the [name], please go directly to the [name] and check in at the registration area.   Wear comfortable clothing and clothing appropriate for easy access to any Portacath or PICC line.   We strive to give you quality time with your provider. You may need to reschedule your appointment if you arrive late (15 or more minutes).  Arriving late affects you and other patients whose appointments are after yours.  Also, if you miss three or more appointments without notifying the office, you may be dismissed from the clinic at the provider's discretion.      For prescription refill requests, have your pharmacy contact our office and allow 72 hours for refills to be completed.    Today you received the following chemotherapy and/or immunotherapy agents: Trastuzumab.       To help prevent nausea and vomiting after your treatment, we encourage you to take your nausea medication as directed.  BELOW ARE SYMPTOMS THAT SHOULD BE REPORTED IMMEDIATELY: *FEVER GREATER THAN 100.4 F (38 C) OR HIGHER *CHILLS OR SWEATING *NAUSEA AND VOMITING THAT IS NOT CONTROLLED WITH YOUR NAUSEA MEDICATION *UNUSUAL SHORTNESS OF BREATH *UNUSUAL BRUISING OR BLEEDING *URINARY PROBLEMS (pain or burning when urinating, or frequent urination) *BOWEL PROBLEMS (unusual diarrhea, constipation, pain near the anus) TENDERNESS IN MOUTH AND THROAT WITH OR WITHOUT PRESENCE OF ULCERS (sore throat, sores in mouth, or a toothache) UNUSUAL RASH, SWELLING OR PAIN  UNUSUAL VAGINAL DISCHARGE OR ITCHING   Items with * indicate a potential emergency and should be followed up as soon as possible or go to the Emergency Department if any problems should occur.  Please show the CHEMOTHERAPY ALERT CARD or IMMUNOTHERAPY ALERT CARD at check-in  to the Emergency Department and triage nurse.  Should you have questions after your visit or need to cancel or reschedule your appointment, please contact [name]  Dept: [phone]  and follow the prompts.  Office hours are 8:00 a.m. to 4:30 p.m. Monday - Friday. Please note that voicemails left after 4:00 p.m. may not be returned until the following business day.  We are closed weekends and major holidays. You have access to a nurse at all times for urgent questions. Please call the main number to the clinic Dept: [phone] and follow the prompts.   For any non-urgent questions, you may also contact your provider using MyChart. We now offer e-Visits for anyone 18 and older to request care online for non-urgent symptoms. For details visit [email or web address].   Also download the MyChart app! Go to the app store, search "MyChart", open the app, select [facility], and log in with your MyChart username and password.  Masks are optional in the cancer centers. If you would like for your care team to wear a mask while they are taking care of you, please let them know. You may have one support person who is at least 85 years old accompany you for your appointments. 

## 2022-03-09 ENCOUNTER — Other Ambulatory Visit: Payer: Self-pay

## 2022-03-22 NOTE — Progress Notes (Signed)
CARDIO-ONCOLOGY CLINIC NOTE  Referring Physician: Dr. Lindi Adie Primary Care: Orpah Melter, MD Primary Cardiologist: None   HPI:  [name] is an  87 y.o. male with HTN,  CKD 3a, previous colon CA and right breast cancer referred by Dr. Lindi Adie for enrollment into the Cardio-Oncology program due to possible Herceptin cardiotoxicity.   Diagnosed with R breast cancer in 7/22. Had R mastectomy in 8/22. Stage IIa ER+, HER2 +, PR-  Started therapy with Herceptin q21 days in 9/22  EF 2/23 EF  45-50% GLS -14.5 (I felt 50-55%) Echo 11/22 EF 50-55% GLS -20.7  Echo 8/22 55-60% GLS -21.9  Zio 3.23 6.2% PVCs  Echo 4/23 EF ~45% (definitely lower since previous) Herceptin held Echo 6/23: EF 45-50% G1DD GLS -19.7% Herceptin restarted  Here for f/u. At last visit switched increased Entresto due to HTN. Feels good. Remains active. No CP or SOB. No edema. Herceptin completed  Echo 03/23/22: EF 55-60% GLS -18.6% Mod LVH. 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 180 tablet 1   empagliflozin (JARDIANCE) 10 MG TABS tablet Take 10 mg by mouth daily.     polyethylene glycol (MIRALAX) 17 g packet Take 17 g by mouth daily. 14 each 0   sacubitril-valsartan (ENTRESTO) 97-103 MG Take 1 tablet by mouth 2 (two) times daily. 180 tablet 1   tamoxifen (NOLVADEX) 20 MG tablet Take 20 mg by mouth daily.     No current facility-administered medications for this encounter.    Allergies   Allergen Reactions   Penicillins Diarrhea, Itching and Other (See Comments)    TESTICULAR PAIN  Has patient had a PCN reaction causing immediate rash, facial/tongue/throat swelling, SOB or lightheadedness with hypotension: No SEVERE RASH INVOLVING MUCUS MEMBRANES or SKIN NECROSIS: #  #  #  YES  #  #  #  Has patient had a PCN reaction that required hospitalization No Has patient had a PCN reaction occurring within the last 10 years: No.    Jardiance [Empagliflozin] Other (See Comments)    Constipation       Social History   Socioeconomic History   Marital status: Married    Spouse name: Not on file   Number of children: Not on file   Years of education: Not on file   Highest education level: Not on file  Occupational History   Not on file  Tobacco Use   Smoking status: Former   Smokeless tobacco: Never   Tobacco comments:    "toyed with " none in over 40 years"  Vaping Use   Vaping Use: Never used  Substance and Sexual Activity   Alcohol use: Yes    Comment: 09/08/2016 "might have a couple drinks on holidays"   Drug use: No   Sexual activity: Not Currently  Other Topics Concern   Not on file  Social History Narrative   Not on file  Social Determinants of Health   Financial Resource Strain: Not on file  Food Insecurity: Not on file  Transportation Needs: Not on file  Physical Activity: Not on file  Stress: Not on file  Social Connections: Not on file  Intimate Partner Violence: Not on file      Family History  Problem Relation Age of Onset   Breast cancer Mother        dx 3s-70s   Colon cancer Father 6       metastatic   Breast cancer Maternal Aunt        x2 maternal aunts; dx 45s   Cancer Paternal Aunt        unknown type; d. 71s   Esophageal cancer Paternal Uncle        d. 13s   Cancer Paternal Uncle        unknown type; d. 67s   Ovarian cancer Paternal Grandmother        d. late 30s-early 81s    Vitals:   03/23/22 0856  BP: 130/70  Pulse:  (!) 55  SpO2: 100%  Weight: 97.4 kg (214 lb 12.8 oz)    PHYSICAL EXAM: General:  Well appearing. No resp difficulty HEENT: normal Neck: supple. JVP 7. Carotids 2+ bilat; no bruits. No lymphadenopathy or thryomegaly appreciated. Cor: PMI nondisplaced. Regular rate & rhythm. No rubs, gallops or murmurs. Lungs: clear Abdomen: soft, nontender, nondistended. No hepatosplenomegaly. No bruits or masses. Good bowel sounds. Extremities: no cyanosis, clubbing, rash, trace edema Neuro: alert & orientedx3, cranial nerves grossly intact. moves all 4 extremities w/o difficulty. Affect pleasant   ASSESSMENT & PLAN:  1. Right Breast Cancer - Diagnosed 7/22. - R mastectomy in 8/22. Stage IIa ER+, HER2 +, PR-   - Started therapy with Herceptin q21 days in 9/22 - Herceptin held 4/23 x 3 doses - Echo 6/23 45-50%. GLS normal -> Herceptin restarted - Echo 12/28/21: EF 50-55% GLS -19.1% -> continue Herceptin - Herceptin completed 11/23 - Echo today 03/23/22: EF 55-60% GLS -18.6% Personally reviewed   2. Chemo-induced cardiotoxicty with mild systolic HF - Echo 74/25 EF 50-55% GLS -20.7  - Echo 8/22 55-60% GLS -21.9 - Echo 2/23 45-50% GLS -14.5 (I felt 50-55%) - Echo 09/03/21 EF ~45% -> Herceptin held  - Echo 6/23: EF 45-50% G1DD GLS -19.7%  - Echo 12/28/21: EF 50-55% GLS -19.1%  - Echo today 03/23/22: EF 55-60% GLS -18.6% Personally reviewed - Suspect mild chemo-induced cardiotoxicity. Herceptin held 4/23. Restarted 6/23 - Given LVH and thickened AoV. TTR amyloid also a possibility but suspect more due to HTN - Will check cMRI to assess - NYHA II Mild volume overload - Has finished Herceptin  - Continue carvedilol 3.125 bid (recently cut back due to bradycardia) - Continue Entresto to 97/103 bid - Off Jardiance due to rash and constipation - Will avoid spiro for now with estrogen effect and breast CA - Will give lasix 40 daily on Monday and Friday with k20  3. HTN  - Blood pressure well  controlled. Continue current regimen.  4. PVCs on ECG - Zio 3/23 6.2% PVCs. May be contributing to cardiomyopathy - Has not completed home sleep study  5. Bradycardia - continue to follow - doesn't meet indication for PPM at this point     Glori Bickers, MD  9:14 AM

## 2022-03-23 ENCOUNTER — Ambulatory Visit (HOSPITAL_COMMUNITY)
Admission: RE | Admit: 2022-03-23 | Discharge: 2022-03-23 | Disposition: A | Discharge status: Home / Self Care | Payer: Medicare Other | Source: Ambulatory Visit | Attending: Family Medicine | Admitting: Family Medicine

## 2022-03-23 ENCOUNTER — Other Ambulatory Visit (HOSPITAL_COMMUNITY): Payer: Self-pay

## 2022-03-23 ENCOUNTER — Telehealth (HOSPITAL_COMMUNITY): Payer: Self-pay

## 2022-03-23 ENCOUNTER — Encounter (HOSPITAL_COMMUNITY): Payer: Self-pay | Admitting: Internal Medicine

## 2022-03-23 ENCOUNTER — Ambulatory Visit (HOSPITAL_BASED_OUTPATIENT_CLINIC_OR_DEPARTMENT_OTHER)
Admission: RE | Admit: 2022-03-23 | Discharge: 2022-03-23 | Disposition: A | Discharge status: Home / Self Care | Payer: Medicare Other | Source: Ambulatory Visit | Attending: Internal Medicine | Admitting: Internal Medicine

## 2022-03-23 VITALS — BP 130/70 | HR 55 | Wt 214.8 lb

## 2022-03-23 DIAGNOSIS — R21 Rash and other nonspecific skin eruption: Secondary | ICD-10-CM | POA: Insufficient documentation

## 2022-03-23 DIAGNOSIS — I5020 Unspecified systolic (congestive) heart failure: Secondary | ICD-10-CM

## 2022-03-23 DIAGNOSIS — Z853 Personal history of malignant neoplasm of breast: Secondary | ICD-10-CM | POA: Insufficient documentation

## 2022-03-23 DIAGNOSIS — C50121 Malignant neoplasm of central portion of right male breast: Secondary | ICD-10-CM

## 2022-03-23 DIAGNOSIS — I5022 Chronic systolic (congestive) heart failure: Secondary | ICD-10-CM

## 2022-03-23 DIAGNOSIS — K5900 Constipation, unspecified: Secondary | ICD-10-CM | POA: Diagnosis not present

## 2022-03-23 DIAGNOSIS — Z79899 Other long term (current) drug therapy: Secondary | ICD-10-CM | POA: Diagnosis not present

## 2022-03-23 DIAGNOSIS — Z170 Estrogen receptor positive status [ER+]: Secondary | ICD-10-CM

## 2022-03-23 DIAGNOSIS — N1831 Chronic kidney disease, stage 3a: Secondary | ICD-10-CM | POA: Diagnosis not present

## 2022-03-23 DIAGNOSIS — R001 Bradycardia, unspecified: Secondary | ICD-10-CM | POA: Diagnosis not present

## 2022-03-23 DIAGNOSIS — I130 Hypertensive heart and chronic kidney disease with heart failure and stage 1 through stage 4 chronic kidney disease, or unspecified chronic kidney disease: Secondary | ICD-10-CM | POA: Diagnosis present

## 2022-03-23 DIAGNOSIS — I10 Essential (primary) hypertension: Secondary | ICD-10-CM | POA: Diagnosis not present

## 2022-03-23 DIAGNOSIS — Z85038 Personal history of other malignant neoplasm of large intestine: Secondary | ICD-10-CM | POA: Insufficient documentation

## 2022-03-23 DIAGNOSIS — Z9011 Acquired absence of right breast and nipple: Secondary | ICD-10-CM | POA: Diagnosis not present

## 2022-03-23 LAB — ECHOCARDIOGRAM COMPLETE
AR max vel: 2.31 cm2
AV Area VTI: 2.3 cm2
AV Area mean vel: 2.28 cm2
AV Mean grad: 3 mmHg
AV Peak grad: 6.1 mmHg
Ao pk vel: 1.23 m/s
Area-P 1/2: 2.3 cm2
Calc EF: 51.9 %
S' Lateral: 3.2 cm
Single Plane A2C EF: 50.7 %
Single Plane A4C EF: 50.3 %

## 2022-03-23 LAB — CBC
HCT: 39.3 % (ref 39.0–52.0)
Hemoglobin: 13.6 g/dL (ref 13.0–17.0)
MCH: 32.2 pg (ref 26.0–34.0)
MCHC: 34.6 g/dL (ref 30.0–36.0)
MCV: 92.9 fL (ref 80.0–100.0)
Platelets: 239 10*3/uL (ref 150–400)
RBC: 4.23 MIL/uL (ref 4.22–5.81)
RDW: 13.6 % (ref 11.5–15.5)
WBC: 8.5 10*3/uL (ref 4.0–10.5)
nRBC: 0 % (ref 0.0–0.2)

## 2022-03-23 MED ORDER — FUROSEMIDE 40 MG PO TABS: 40.0000 mg | ORAL_TABLET | ORAL | 5 refills | Status: DC

## 2022-03-23 MED ORDER — POTASSIUM CHLORIDE CRYS ER 20 MEQ PO TBCR: 20.0000 meq | EXTENDED_RELEASE_TABLET | ORAL | 5 refills | Status: DC

## 2022-03-23 NOTE — Telephone Encounter (Signed)
Advanced Heart Failure Patient Advocate Encounter  Patient expressed concern with medication costs when leaving office. Spoke to patient on the phone, he has not refilled Entresto at a retail location in a few months and was concerned that the cost would be a barrier.  Current test claims return apx $25 for 30 days, $75 for 90 days. This pricing is affordable to the patient, no assistance started at this time.  Clista Bernhardt, CPhT Rx Patient Advocate Phone: [phone]

## 2022-03-23 NOTE — Patient Instructions (Signed)
Good to see you today!  START Lasix 40 mg 2 x a week ( Monday and  Friday)  START Potassium chloride 20 meq 2 x a week ( Monday and Friday)  Lab work done we will call with any abnormal results   Your physician has requested that you have a cardiac MRI. Cardiac MRI uses a computer to create images of your heart as its beating, producing both still and moving pictures of your heart and major blood vessels. For further information please visit [email or web address] Please follow the instruction sheet given to you today for more information. We will call to schedule once insurance  approves  Your physician recommends that you schedule a follow-up appointment in: 4 months(March 2024) Call in January 2024 to schedule an appointment  If you have any questions or concerns before your next appointment please send [place] a message through [facility] or call our office at [phone].    TO LEAVE A MESSAGE FOR THE NURSE SELECT OPTION 2, PLEASE LEAVE A MESSAGE INCLUDING: YOUR NAME DATE OF BIRTH CALL BACK NUMBER REASON FOR CALL**this is important as we prioritize the call backs  YOU WILL RECEIVE A CALL BACK THE SAME DAY AS LONG AS YOU CALL BEFORE 4:00 PM  At the [facility], you and your health needs are our priority. As part of our continuing mission to provide you with exceptional heart care, we have created designated Provider Care Teams. These Care Teams include your primary Cardiologist (physician) and Advanced Practice Providers (APPs- Physician Assistants and Nurse Practitioners) who all work together to provide you with the care you need, when you need it.   You may see any of the following providers on your designated Care Team at your next follow up: Dr Glori Bickers Dr Loralie Champagne Dr. Roxana Hires, NP Lyda Jester, [place] [facility] [place], [place] Forestine Na, NP Audry Riles, PharmD   Please be sure to bring in all your medications bottles to  every appointment.

## 2022-03-23 NOTE — Progress Notes (Signed)
  Echocardiogram 2D Echocardiogram has been performed.  [facility] 03/23/2022, 8:48 AM

## 2022-03-23 NOTE — Addendum Note (Signed)
Encounter addended by: Stanford Scotland, RN on: 03/23/2022 9:42 AM  Actions taken: Pharmacy for encounter modified, Order list changed

## 2022-03-23 NOTE — Addendum Note (Signed)
Encounter addended by: Stanford Scotland, RN on: 03/23/2022 9:40 AM  Actions taken: Order Reconciliation Section accessed, Home Medications modified, Order list changed, Diagnosis association updated, Clinical Note Signed

## 2022-03-25 ENCOUNTER — Other Ambulatory Visit (HOSPITAL_COMMUNITY): Payer: Self-pay | Admitting: Internal Medicine

## 2022-04-13 ENCOUNTER — Encounter: Payer: Self-pay | Admitting: *Deleted

## 2022-05-06 ENCOUNTER — Other Ambulatory Visit: Payer: Self-pay | Admitting: Pharmacist

## 2022-05-06 ENCOUNTER — Other Ambulatory Visit: Payer: Self-pay | Admitting: *Deleted

## 2022-05-06 MED ORDER — TAMOXIFEN CITRATE 20 MG PO TABS: 20.0000 mg | ORAL_TABLET | Freq: Every day | ORAL | 3 refills | Status: DC

## 2022-05-26 ENCOUNTER — Inpatient Hospital Stay: Payer: Medicare Other | Attending: Oncology | Admitting: Adult Health

## 2022-05-26 ENCOUNTER — Other Ambulatory Visit: Payer: Self-pay

## 2022-05-26 ENCOUNTER — Other Ambulatory Visit (HOSPITAL_COMMUNITY): Payer: Self-pay

## 2022-05-26 ENCOUNTER — Telehealth: Payer: Self-pay | Admitting: Hematology and Oncology

## 2022-05-26 ENCOUNTER — Encounter: Payer: Self-pay | Admitting: Adult Health

## 2022-05-26 VITALS — BP 138/60 | HR 63 | Temp 97.6°F | Resp 16 | Ht 70.0 in | Wt 224.7 lb

## 2022-05-26 DIAGNOSIS — Z79810 Long term (current) use of selective estrogen receptor modulators (SERMs): Secondary | ICD-10-CM | POA: Diagnosis not present

## 2022-05-26 DIAGNOSIS — Z170 Estrogen receptor positive status [ER+]: Secondary | ICD-10-CM | POA: Diagnosis not present

## 2022-05-26 DIAGNOSIS — Z87891 Personal history of nicotine dependence: Secondary | ICD-10-CM | POA: Diagnosis not present

## 2022-05-26 DIAGNOSIS — Z8041 Family history of malignant neoplasm of ovary: Secondary | ICD-10-CM | POA: Insufficient documentation

## 2022-05-26 DIAGNOSIS — Z9011 Acquired absence of right breast and nipple: Secondary | ICD-10-CM | POA: Diagnosis not present

## 2022-05-26 DIAGNOSIS — Z803 Family history of malignant neoplasm of breast: Secondary | ICD-10-CM | POA: Insufficient documentation

## 2022-05-26 DIAGNOSIS — C50121 Malignant neoplasm of central portion of right male breast: Secondary | ICD-10-CM | POA: Diagnosis present

## 2022-05-26 DIAGNOSIS — I10 Essential (primary) hypertension: Secondary | ICD-10-CM | POA: Insufficient documentation

## 2022-05-26 DIAGNOSIS — Z800 Family history of malignant neoplasm of digestive organs: Secondary | ICD-10-CM | POA: Insufficient documentation

## 2022-05-26 NOTE — Progress Notes (Signed)
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, HER2 positive, with a Ki-67 of 15%   12/15/2020 Cancer Staging   Staging form: Breast, AJCC 8th Edition - Clinical: Stage IIIB (cT4, cN0, cM0, G2, ER+, PR-, HER2+) - Signed by Chauncey Cruel, MD on 12/15/2020 Histologic grading system: 3 grade system   12/30/2020 Surgery   Right mastectomy 12/30/2020 for a pT2, pN0, stage IIA invasive ductal carcinoma, grade 2, with negative margins 0/1 lymph node negative   01/22/2021 - 03/03/2022 Chemotherapy   Patient is on Treatment Plan : BREAST Trastuzumab q21d     02/13/2021 Genetic Testing   Negative hereditary cancer genetic testing: no pathogenic variants detected in [facility] CancerNext-Expanded +RNAinsight Panel.  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  The report date is February 13, 2021.   The CancerNext-Expanded gene panel offered by [facility] and includes sequencing, rearrangement, and RNA analysis for the following 77 genes: AIP, ALK, APC, ATM, AXIN2, BAP1, BARD1, BLM, BMPR1A, BRCA1, BRCA2, BRIP1, CDC73, CDH1, CDK4, CDKN1B, CDKN2A, CHEK2, CTNNA1, DICER1, FANCC, FH, FLCN, GALNT12, KIF1B, LZTR1, MAX, MEN1, MET, MLH1, MSH2, MSH3, MSH6, MUTYH, NBN, NF1, NF2, NTHL1, PALB2, PHOX2B, PMS2, POT1, PRKAR1A, PTCH1, PTEN, RAD51C, RAD51D, RB1, RECQL, RET, SDHA, SDHAF2, SDHB, SDHC, SDHD, SMAD4, SMARCA4, SMARCB1, SMARCE1, STK11, SUFU, TMEM127, TP53, TSC1, TSC2, VHL and XRCC2 (sequencing and deletion/duplication); EGFR, EGLN1, HOXB13, KIT, MITF, PDGFRA, POLD1, and POLE (sequencing only); EPCAM and GREM1 (deletion/duplication only).    03/09/2021 -  Anti-estrogen oral therapy   tamoxifen  started 03/09/2021     INTERVAL HISTORY:  Terrance Powell to review her survivorship care plan detailing her treatment course for breast cancer, as well as monitoring long-term side effects of that treatment, education regarding health maintenance, screening, and overall wellness and health promotion.     Overall, Terrance Powell reports feeling quite well.  He is taking Tamoxifen daily and denies any difficulty with this medication.    REVIEW OF SYSTEMS:  Review of Systems  Constitutional:  Negative for appetite change, chills, fatigue, fever and unexpected weight change.  HENT:   Negative for hearing loss, lump/mass and trouble swallowing.   Eyes:  Negative for eye problems and icterus.  Respiratory:  Negative for chest tightness, cough and shortness of breath.   Cardiovascular:  Negative for chest pain, leg swelling and palpitations.  Gastrointestinal:  Negative for abdominal distention, abdominal pain, constipation, diarrhea, nausea and vomiting.  Endocrine: Negative for hot flashes.  Genitourinary:  Negative for difficulty urinating.   Musculoskeletal:  Negative for arthralgias.  Skin:  Negative for itching and rash.  Neurological:  Negative for dizziness, extremity weakness, headaches and numbness.  Hematological:  Negative for adenopathy. Does not bruise/bleed easily.  Psychiatric/Behavioral:  Negative for depression. The patient is not nervous/anxious.   Breast: Denies any new nodularity, masses, tenderness, nipple changes, or nipple discharge.       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   Past Surgical History:  Procedure Laterality Date   COLECTOMY     LAP  ASSISTED ASCENDING COLECTOMY/notes 09/08/2016   COLONOSCOPY W/ BIOPSIES     "last one was 08/09/2016" (09/08/2016)   LAPAROSCOPIC RIGHT COLECTOMY N/A 09/08/2016   Procedure: LAP ASSISTED ASCENDING COLECTOMY;  Surgeon: [name], MD;  Location: [place];  Service: General;  Laterality: N/A;   MASTECTOMY W/ SENTINEL NODE BIOPSY Right 12/29/2020   Procedure: RIGHT MASTECTOMY WITH SENTINEL LYMPH NODE BIOPSY;  Surgeon: [name], MD;  Location: [place];  Service: General;  Laterality: Right;     ALLERGIES:  Allergies  Allergen Reactions   Penicillins Diarrhea, Itching and Other (See Comments)    TESTICULAR PAIN  Has patient had a PCN reaction causing immediate rash, facial/tongue/throat swelling, SOB or lightheadedness with hypotension: No SEVERE RASH INVOLVING MUCUS MEMBRANES or SKIN NECROSIS: #  #  #  YES  #  #  #  Has patient had a PCN reaction that required hospitalization No Has patient had a PCN reaction occurring within the last 10 years: No.    Jardiance [Empagliflozin] Other (See Comments)    Constipation      CURRENT MEDICATIONS:  Outpatient Encounter Medications as of 05/26/2022  Medication Sig   tamoxifen (NOLVADEX) 20 MG tablet Take 1 tablet (20 mg total) by mouth daily.   carvedilol (COREG) 3.125 MG tablet TAKE 2 TABLETS (6.25 MG TOTAL) BY MOUTH 2 (TWO) TIMES DAILY.   furosemide (LASIX) 40 MG tablet Take 1 tablet (40 mg total) by mouth 2 (two) times a week. Take 1 tablet Monday and Friday   polyethylene glycol (MIRALAX) 17 g packet Take 17 g by mouth daily.   potassium chloride SA (KLOR-CON M) 20 MEQ tablet Take 1 tablet (20 mEq total) by mouth 2 (two) times a week. Take with lasix dose   sacubitril-valsartan (ENTRESTO) 97-103 MG Take 1 tablet by mouth 2 (two) times daily.   No facility-administered encounter medications on file as of 05/26/2022.     ONCOLOGIC FAMILY HISTORY:  Family History  Problem Relation Age of Onset   Breast cancer Mother         dx 52s-70s   Colon cancer Father 33       metastatic   Breast cancer Maternal Aunt        x2 maternal aunts; dx 30s   Cancer Paternal Aunt        unknown type; d. 35s   Esophageal cancer Paternal Uncle        d. 25s   Cancer Paternal Uncle        unknown type; d. 69s   Ovarian cancer Paternal Grandmother        d. late 46s-early 3s     SOCIAL HISTORY:  Social History   Socioeconomic History   Marital status: Married    Spouse name: Not on file   Number of children: Not on file   Years of education: Not on file   Highest education level: Not on file  Occupational History   Not on file  Tobacco Use   Smoking status: Former   Smokeless tobacco: Never   Tobacco comments:    "toyed with " none in over 40 years"  Vaping Use   Vaping Use: Never used  Substance and Sexual Activity   Alcohol use: Yes    Comment: 09/08/2016 "might have a couple drinks on holidays"   Drug use:  No   Sexual activity: Not Currently  Other Topics Concern   Not on file  Social History Narrative   Not on file   Social Determinants of Health   Financial Resource Strain: Not on file  Food Insecurity: Not on file  Transportation Needs: Not on file  Physical Activity: Not on file  Stress: Not on file  Social Connections: Not on file  Intimate Partner Violence: Not on file     OBSERVATIONS/OBJECTIVE:  BP 138/60 (BP Location: Right Arm, Patient Position: Sitting)   Pulse 63   Temp 97.6 F (36.4 C)   Resp 16   Ht '5\' 10"'$  (1.778 m)   Wt 224 lb 11.2 oz (101.9 kg)   SpO2 100%   BMI 32.24 kg/m  GENERAL: Patient is a well appearing male in no acute distress HEENT:  Sclerae anicteric.  Oropharynx clear and moist. No ulcerations or evidence of oropharyngeal candidiasis. Neck is supple.  NODES:  No cervical, supraclavicular, or axillary lymphadenopathy palpated.  BREAST EXAM: Right breast status postmastectomy no sign of local recurrence left breast is benign. LUNGS:  Clear to  auscultation bilaterally.  No wheezes or rhonchi. HEART:  Regular rate and rhythm. No murmur appreciated. ABDOMEN:  Soft, nontender.  Positive, normoactive bowel sounds. No organomegaly palpated. MSK:  No focal spinal tenderness to palpation. Full range of motion bilaterally in the upper extremities. EXTREMITIES:  No peripheral edema.   SKIN:  Clear with no obvious rashes or skin changes. No nail dyscrasia. NEURO:  Nonfocal. Well oriented.  Appropriate affect.   LABORATORY DATA:  None for this visit.  DIAGNOSTIC IMAGING:  None for this visit.      ASSESSMENT AND PLAN:  Terrance Powell is a pleasant 88 y.o. male with Stage 3B right breast invasive ductal carcinoma, ER+/PR-/HER2+, diagnosed in July 2022, treated with mastectomy, Herceptin x 1 year, and anti-estrogen therapy with tamoxifen beginning in October 2022.  He presents to the Survivorship Clinic for our initial meeting and routine follow-up post-completion of treatment for breast cancer.    1. Stage IIIB right breast cancer:  Terrance Powell is continuing to recover from definitive treatment for breast cancer. he will follow-up with her medical oncologist, Dr. Lindi Adie 11/2022  with history and physical exam per surveillance protocol. He will continue her anti-estrogen therapy with Tamoxifen as he is tolerating it well.  I placed orders for left breast screening mammogram to occur in the next 1-2 months.  Today, a comprehensive survivorship care plan and treatment summary was reviewed with the patient today detailing his breast cancer diagnosis, treatment course, potential late/long-term effects of treatment, appropriate follow-up care with recommendations for the future, and patient education resources.  A copy of this summary, along with a letter will be sent to the patient's primary care provider via mail/fax/In Basket message after today's visit.    2. Cancer screening:  Due to Terrance Powell history and age, he should receive screening for skin  cancer.  The information and recommendations are listed on the patient's comprehensive care plan/treatment summary and were reviewed in detail with the patient.    3. Health maintenance and wellness promotion: Terrance Powell was encouraged to consume 5-7 servings of fruits and vegetables per day. We reviewed the "Nutrition Rainbow" handout. He was also encouraged to engage in moderate to vigorous exercise for 30 minutes per day most days of the week. He was instructed to limit his alcohol consumption and continue to abstain from tobacco use.  He has not yet received  the shingles vaccine.  I gave him detailed information about this in his AVS and our pharmacy ran his cost which is $0.  I gave him a map to the [facility] outpatient pharmacy and their phone # so he can call and schedule an appointment if interested.     4. Support services/counseling: It is not uncommon for this period of the patient's cancer care trajectory to be one of many emotions and stressors.  He was given information regarding our available services and encouraged to contact me with any questions or for help enrolling in any of our support group/programs.    Follow up instructions:    -Return to cancer center in 6 months for f/u with Dr. Lindi Adie  -Mammogram due in 1-2 months -he is welcome to return back to the Survivorship Clinic at any time; no additional follow-up needed at this time.  -Consider referral back to survivorship as a long-term survivor for continued surveillance  The patient was provided an opportunity to ask questions and all were answered. The patient agreed with the plan and demonstrated an understanding of the instructions.   Total encounter time:45 minutes*in face-to-face visit time, chart review, lab review, care coordination, order entry, and documentation of the encounter time.    Wilber Bihari, NP 05/26/22 8:58 AM Medical Oncology and Hematology [facility] [place],   [address] Tel. [phone]    Fax. [phone]  *Total Encounter Time as defined by the Centers for Medicare and Medicaid Services includes, in addition to the face-to-face time of a patient visit (documented in the note above) non-face-to-face time: obtaining and reviewing outside history, ordering and reviewing medications, tests or procedures, care coordination (communications with other health care professionals or caregivers) and documentation in the medical record.

## 2022-05-26 NOTE — Patient Instructions (Signed)
Zoster Vaccine Injection What is this medication? ZOSTER VACCINE (ZOS ter vak SEEN) reduces the risk of herpes zoster (shingles). It does not treat shingles. It is still possible to get shingles after receiving the vaccine, but the symptoms may be less severe or not last as long. It works by helping your immune system learn how to fight off a future infection. This medicine may be used for other purposes; ask your health care provider or pharmacist if you have questions. COMMON BRAND NAME(S): SHINGRIX What should I tell my care team before I take this medication? They need to know if you have any of these conditions: Cancer Immune system problems An unusual or allergic reaction to Zoster vaccine, other medications, foods, dyes, or preservatives Pregnant or trying to get pregnant Breastfeeding How should I use this medication? This vaccine is injected into a muscle. It is given by your care team. This vaccine requires 2 doses to get the full benefit. Set a reminder for when your next dose is due. A copy of Vaccine Information Statements will be given before each vaccination. Be sure to read this information carefully each time. This sheet may change often. Talk to your care team about the use of this vaccine in children. This vaccine is not approved for use in children. Overdosage: If you think you have taken too much of this medicine contact a poison control center or emergency room at once. NOTE: This medicine is only for you. Do not share this medicine with others. What if I miss a dose? Keep appointments for follow-up (booster) doses. It is important not to miss your dose. Call your care team if you are unable to keep an appointment. What may interact with this medication? Medications that suppress your immune system Medications to treat cancer Steroid medications, such as prednisone or cortisone This list may not describe all possible interactions. Give your health care provider a list  of all the medicines, herbs, non-prescription drugs, or dietary supplements you use. Also tell them if you smoke, drink alcohol, or use illegal drugs. Some items may interact with your medicine. What should I watch for while using this medication? Visit your care team regularly. This vaccine, like all vaccines, may not fully protect everyone. What side effects may I notice from receiving this medication? Side effects that you should report to your care team as soon as possible: Allergic reactions--skin rash, itching, hives, swelling of the face, lips, tongue, or throat Side effects that usually do not require medical attention (report these to your care team if they continue or are bothersome): Chills Fatigue Feeling faint or lightheaded Fever Headache Muscle pain Pain, redness, or irritation at injection site This list may not describe all possible side effects. Call your doctor for medical advice about side effects. You may report side effects to FDA at 1-800-FDA-1088. Where should I keep my medication? This vaccine is only given by your care team. It will not be stored at home. NOTE: This sheet is a summary. It may not cover all possible information. If you have questions about this medicine, talk to your doctor, pharmacist, or health care provider.  2023 Elsevier/Gold Standard (2021-10-14 00:00:00)  

## 2022-05-26 NOTE — Telephone Encounter (Signed)
Scheduled appointment per 1/10 los. Patient is aware.

## 2022-06-25 ENCOUNTER — Telehealth (HOSPITAL_COMMUNITY): Payer: Self-pay | Admitting: Emergency Medicine

## 2022-06-25 NOTE — Telephone Encounter (Signed)
Reaching out to patient to offer assistance regarding upcoming cardiac imaging study; pt verbalizes understanding of appt date/time, parking situation and where to check in, pre-test NPO status and medications ordered, and verified current allergies; name and call back number provided for further questions should they arise Marchia Bond RN Navigator Cardiac Imaging [facility] [phone] office [phone] cell  Arrival [address] Denies metal implants Denies iv issues Denies claustro

## 2022-06-28 ENCOUNTER — Other Ambulatory Visit (HOSPITAL_COMMUNITY): Payer: Self-pay | Admitting: Internal Medicine

## 2022-06-28 ENCOUNTER — Ambulatory Visit (HOSPITAL_COMMUNITY)
Admission: RE | Admit: 2022-06-28 | Discharge: 2022-06-28 | Disposition: A | Discharge status: Home / Self Care | Payer: Medicare Other | Source: Ambulatory Visit | Attending: Internal Medicine | Admitting: Internal Medicine

## 2022-06-28 DIAGNOSIS — I5022 Chronic systolic (congestive) heart failure: Secondary | ICD-10-CM

## 2022-06-28 MED ORDER — GADOBUTROL 1 MMOL/ML IV SOLN
10.0000 mL | Freq: Once | INTRAVENOUS | Status: AC | PRN
  Administered 2022-06-28: 10 mL via INTRAVENOUS

## 2022-07-01 ENCOUNTER — Other Ambulatory Visit (HOSPITAL_COMMUNITY): Payer: Self-pay | Admitting: Internal Medicine

## 2022-08-11 ENCOUNTER — Ambulatory Visit (HOSPITAL_COMMUNITY)
Admission: RE | Admit: 2022-08-11 | Discharge: 2022-08-11 | Disposition: A | Discharge status: Home / Self Care | Payer: Medicare Other | Source: Ambulatory Visit | Attending: Internal Medicine | Admitting: Internal Medicine

## 2022-08-11 ENCOUNTER — Encounter (HOSPITAL_COMMUNITY): Payer: Self-pay | Admitting: Internal Medicine

## 2022-08-11 VITALS — BP 140/88 | HR 69 | Wt 222.6 lb

## 2022-08-11 DIAGNOSIS — N1831 Chronic kidney disease, stage 3a: Secondary | ICD-10-CM | POA: Insufficient documentation

## 2022-08-11 DIAGNOSIS — Z85038 Personal history of other malignant neoplasm of large intestine: Secondary | ICD-10-CM | POA: Diagnosis not present

## 2022-08-11 DIAGNOSIS — Z170 Estrogen receptor positive status [ER+]: Secondary | ICD-10-CM

## 2022-08-11 DIAGNOSIS — Z79899 Other long term (current) drug therapy: Secondary | ICD-10-CM | POA: Insufficient documentation

## 2022-08-11 DIAGNOSIS — R21 Rash and other nonspecific skin eruption: Secondary | ICD-10-CM | POA: Insufficient documentation

## 2022-08-11 DIAGNOSIS — Z9011 Acquired absence of right breast and nipple: Secondary | ICD-10-CM | POA: Diagnosis not present

## 2022-08-11 DIAGNOSIS — C50121 Malignant neoplasm of central portion of right male breast: Secondary | ICD-10-CM

## 2022-08-11 DIAGNOSIS — I130 Hypertensive heart and chronic kidney disease with heart failure and stage 1 through stage 4 chronic kidney disease, or unspecified chronic kidney disease: Secondary | ICD-10-CM | POA: Diagnosis not present

## 2022-08-11 DIAGNOSIS — K5900 Constipation, unspecified: Secondary | ICD-10-CM | POA: Insufficient documentation

## 2022-08-11 DIAGNOSIS — T451X5A Adverse effect of antineoplastic and immunosuppressive drugs, initial encounter: Secondary | ICD-10-CM

## 2022-08-11 DIAGNOSIS — R001 Bradycardia, unspecified: Secondary | ICD-10-CM | POA: Diagnosis not present

## 2022-08-11 DIAGNOSIS — I427 Cardiomyopathy due to drug and external agent: Secondary | ICD-10-CM | POA: Diagnosis not present

## 2022-08-11 DIAGNOSIS — I493 Ventricular premature depolarization: Secondary | ICD-10-CM | POA: Diagnosis not present

## 2022-08-11 DIAGNOSIS — Z853 Personal history of malignant neoplasm of breast: Secondary | ICD-10-CM | POA: Insufficient documentation

## 2022-08-11 DIAGNOSIS — I10 Essential (primary) hypertension: Secondary | ICD-10-CM | POA: Diagnosis not present

## 2022-08-11 MED ORDER — CARVEDILOL 3.125 MG PO TABS: 3.1250 mg | ORAL_TABLET | Freq: Two times a day (BID) | ORAL | 1 refills | Status: DC

## 2022-08-11 MED ORDER — FUROSEMIDE 40 MG PO TABS: 40.0000 mg | ORAL_TABLET | ORAL | 2 refills | Status: DC

## 2022-08-11 MED ORDER — ENTRESTO 97-103 MG PO TABS: 1.0000 | ORAL_TABLET | Freq: Two times a day (BID) | ORAL | 3 refills | Status: DC

## 2022-08-11 MED ORDER — POTASSIUM CHLORIDE CRYS ER 20 MEQ PO TBCR: 20.0000 meq | EXTENDED_RELEASE_TABLET | ORAL | 2 refills | Status: DC

## 2022-08-11 NOTE — Addendum Note (Signed)
Encounter addended by: Stanford Scotland, RN on: 08/11/2022 10:01 AM  Actions taken: Pharmacy for encounter modified, Order list changed, Clinical Note Signed

## 2022-08-11 NOTE — Progress Notes (Signed)
CARDIO-ONCOLOGY CLINIC NOTE  Referring Physician: Dr. Lindi Adie Primary Care: Orpah Melter, MD Primary Cardiologist: None   HPI:  Mr Gift is an  88 y.o. male with HTN,  CKD 3a, previous colon CA and right breast cancer referred by Dr. Lindi Adie for enrollment into the Cardio-Oncology program due to possible Herceptin cardiotoxicity.   Diagnosed with R breast cancer in 7/22. Had R mastectomy in 8/22. Stage IIa ER+, HER2 +, PR-  Started therapy with Herceptin q21 days in 9/22  EF 2/23 EF  45-50% GLS -14.5 (I felt 50-55%) Echo 11/22 EF 50-55% GLS -20.7  Echo 8/22 55-60% GLS -21.9  Zio 3.23 6.2% PVCs  Echo 4/23 EF ~45% (definitely lower since previous) Herceptin held Echo 6/23: EF 45-50% G1DD GLS -19.7% Herceptin restarted Echo  12/28/21: EF 50-55% GLS -19.1%  Echo 03/23/22: EF 55-60% GLS -18.6% Mod LVH. Mild AI  Personally reviewed  cMRI 2/24: EF 51% RF 56% Non-specific basal anteroseptal mid-wall strip of LGE. Normal ECV   Here for f/u. Has finished Herceptin 11/23. Doing great. Ran out Entresto, carvedilol and lasix about 1 month ago. Feels good. No CP or SOB. Mild ankle swelling.    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 14 each 0   tamoxifen (NOLVADEX) 20 MG tablet Take 1 tablet (20 mg total) by mouth daily. 90 tablet 3   carvedilol (COREG) 3.125 MG tablet TAKE 2 TABLETS (6.25 MG TOTAL) BY MOUTH 2 (TWO) TIMES DAILY. (Patient not taking: Reported on 08/11/2022) 360 tablet 0   furosemide (LASIX) 40 MG tablet Take 1 tablet (40 mg total) by mouth 2 (two)  times a week. Take 1 tablet Monday and Friday (Patient not taking: Reported on 08/11/2022) 30 tablet 5   potassium chloride SA (KLOR-CON M) 20 MEQ tablet Take 1 tablet (20 mEq total) by mouth 2 (two) times a week. Take with lasix dose (Patient not taking: Reported on 08/11/2022) 30 tablet 5   sacubitril-valsartan (ENTRESTO) 97-103 MG Take 1 tablet by mouth 2 (two) times daily. (Patient not taking: Reported on 08/11/2022) 180 tablet 1   No current facility-administered medications for this encounter.    Allergies  Allergen Reactions   Penicillins Diarrhea, Itching and Other (See Comments)    TESTICULAR PAIN  Has patient had a PCN reaction causing immediate rash, facial/tongue/throat swelling, SOB or lightheadedness with hypotension: No SEVERE RASH INVOLVING MUCUS MEMBRANES or SKIN NECROSIS: #  #  #  YES  #  #  #  Has patient had a PCN reaction that required hospitalization No Has patient had a PCN reaction occurring within the last 10 years: No.    Jardiance [Empagliflozin] Other (See Comments)    Constipation       Social History   Socioeconomic History   Marital status: Married    Spouse name: Not on file   Number of children: Not on file   Years of education: Not on file   Highest education level: Not on file  Occupational History   Not on  file  Tobacco Use   Smoking status: Former   Smokeless tobacco: Never   Tobacco comments:    "toyed with " none in over 40 years"  Vaping Use   Vaping Use: Never used  Substance and Sexual Activity   Alcohol use: Yes    Comment: 09/08/2016 "might have a couple drinks on holidays"   Drug use: No   Sexual activity: Not Currently  Other Topics Concern   Not on file  Social History Narrative   Not on file   Social Determinants of Health   Financial Resource Strain: Not on file  Food Insecurity: Not on file  Transportation Needs: Not on file  Physical Activity: Not on file  Stress: Not on file  Social Connections: Not on file   Intimate Partner Violence: Not on file      Family History  Problem Relation Age of Onset   Breast cancer Mother        dx 47s-70s   Colon cancer Father 68       metastatic   Breast cancer Maternal Aunt        x2 maternal aunts; dx 56s   Cancer Paternal Aunt        unknown type; d. 80s   Esophageal cancer Paternal Uncle        d. 51s   Cancer Paternal Uncle        unknown type; d. 36s   Ovarian cancer Paternal Grandmother        d. late 30s-early 62s    Vitals:   08/11/22 0935  BP: (!) 140/88  Pulse: 69  SpO2: 100%  Weight: 101 kg (222 lb 9.6 oz)     PHYSICAL EXAM: General:  Elderly. well appearing. No resp difficulty HEENT: normal Neck: supple. no JVD. Carotids 2+ bilat; no bruits. No lymphadenopathy or thryomegaly appreciated. Cor: PMI nondisplaced. Regular rate & rhythm. No rubs, gallops or murmurs. Lungs: clear Abdomen: soft, nontender, nondistended. No hepatosplenomegaly. No bruits or masses. Good bowel sounds. Extremities: no cyanosis, clubbing, rash, 1+ edema Neuro: alert & orientedx3, cranial nerves grossly intact. moves all 4 extremities w/o difficulty. Affect pleasant  ASSESSMENT & PLAN:  1. Right Breast Cancer - Diagnosed 7/22. - R mastectomy in 8/22. Stage IIa ER+, HER2 +, PR-   - Started therapy with Herceptin q21 days in 9/22 - Herceptin held 4/23 x 3 doses - Echo 6/23 45-50%. GLS normal -> Herceptin restarted - Echo 12/28/21: EF 50-55% GLS -19.1% -> continue Herceptin - Herceptin completed 11/23 - Echo 03/23/22: EF 55-60% GLS -18.6% Personally reviewed - cMRI 2/24: EF 51% RF 56% Non-specific basal anteroseptal mid-wall strip of LGE. Normal ECV   2. Chemo-induced cardiotoxicty with mild systolic HF - Echo [id number] EF 50-55% GLS -20.7  - Echo 8/22 55-60% GLS -21.9 - Echo 2/23 45-50% GLS -14.5 (I felt 50-55%) - Echo 09/03/21 EF ~45% -> Herceptin held  - Echo 6/23: EF 45-50% G1DD GLS -19.7%  - Echo 12/28/21: EF 50-55% GLS -19.1%  - Echo 05/23/21: EF  55-60% GLS -18.6% Personally reviewed - cMRI 2/24: EF 51% RF 56% Non-specific basal anteroseptal mid-wall strip of LGE. Normal ECV - Overall EF low normal range may be mild herceptin toxicity but has completed therapy - Stable NYHA II. Volume up slightly. Resume lasix 40 M/F - Restart carvedilol 3.125 bid ( back due to bradycardia) - Restart Entresto to 97/103 bid - Off Jardiance due to rash and constipation - Will avoid spiro for now with estrogen effect  and breast CA - Given low normal EF. Continue GDMT  3. HTN  - BP up. Restart GDMT  4. PVCs on ECG - Zio 3/23 6.2% PVCs. May be contributing to cardiomyopathy - Has not completed home sleep study  5. Bradycardia - resolved   Glori Bickers, MD  9:49 AM

## 2022-08-11 NOTE — Patient Instructions (Signed)
Good to see you today!   Decrease Coreg to 3.125 mg Twice daily  Restart  all your other medications as previously directed  Your physician recommends that you schedule a follow-up appointment in: 9 months(September) Call office in July to schedule an appointment  If you have any questions or concerns before your next appointment please send [place] a message through [place] or call our office at [phone].    TO LEAVE A MESSAGE FOR THE NURSE SELECT OPTION 2, PLEASE LEAVE A MESSAGE INCLUDING: YOUR NAME DATE OF BIRTH CALL BACK NUMBER REASON FOR CALL**this is important as we prioritize the call backs  YOU WILL RECEIVE A CALL BACK THE SAME DAY AS LONG AS YOU CALL BEFORE 4:00 PM  At the [facility], you and your health needs are our priority. As part of our continuing mission to provide you with exceptional heart care, we have created designated Provider Care Teams. These Care Teams include your primary Cardiologist (physician) and Advanced Practice Providers (APPs- Physician Assistants and Nurse Practitioners) who all work together to provide you with the care you need, when you need it.   You may see any of the following providers on your designated Care Team at your next follow up: Dr Glori Bickers Dr Loralie Champagne Dr. Roxana Hires, NP Lyda Jester, [place] [facility] [place], [place] Forestine Na, NP Audry Riles, PharmD   Please be sure to bring in all your medications bottles to every appointment.    Thank you for choosing [facility]

## 2022-08-11 NOTE — Addendum Note (Signed)
Encounter addended by: Stanford Scotland, RN on: 08/11/2022 10:06 AM  Actions taken: Order Reconciliation Section accessed

## 2022-09-23 ENCOUNTER — Other Ambulatory Visit (HOSPITAL_COMMUNITY): Payer: Self-pay

## 2022-09-23 MED ORDER — FUROSEMIDE 40 MG PO TABS: 40.0000 mg | ORAL_TABLET | ORAL | 3 refills | Status: DC

## 2022-09-23 MED ORDER — POTASSIUM CHLORIDE CRYS ER 20 MEQ PO TBCR: 20.0000 meq | EXTENDED_RELEASE_TABLET | ORAL | 3 refills | Status: DC

## 2022-09-23 MED ORDER — CARVEDILOL 3.125 MG PO TABS: 3.1250 mg | ORAL_TABLET | Freq: Two times a day (BID) | ORAL | 3 refills | Status: DC

## 2022-11-19 ENCOUNTER — Telehealth (HOSPITAL_COMMUNITY): Payer: Self-pay

## 2022-11-19 MED ORDER — ENTRESTO 97-103 MG PO TABS: 1.0000 | ORAL_TABLET | Freq: Two times a day (BID) | ORAL | 3 refills | Status: DC

## 2022-11-22 NOTE — Progress Notes (Signed)
Patient Care Team: Joycelyn Rua, MD as PCP - General (Family Medicine) Abigail Miyamoto, MD as Consulting Physician (General Surgery) Antony Blackbird, MD as Consulting Physician (Radiation Oncology) Serena Croissant, MD as Consulting Physician (Hematology and Oncology)  DIAGNOSIS: No diagnosis found.  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, HER2 positive, with a Ki-67 of 15%   12/15/2020 Cancer Staging   Staging form: Breast, AJCC 8th Edition - Clinical: Stage IIIB (cT4, cN0, cM0, G2, ER+, PR-, HER2+) - Signed by Lowella Dell, MD on 12/15/2020 Histologic grading system: 3 grade system   12/30/2020 Surgery   Right mastectomy 12/30/2020 for a pT2, pN0, stage IIA invasive ductal carcinoma, grade 2, with negative margins 0/1 lymph node negative   01/22/2021 - 03/03/2022 Chemotherapy   Patient is on Treatment Plan : BREAST Trastuzumab q21d     02/13/2021 Genetic Testing   Negative hereditary cancer genetic testing: no pathogenic variants detected in [facility] CancerNext-Expanded +RNAinsight Panel.  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  The report date is February 13, 2021.   The CancerNext-Expanded gene panel offered by [facility] and includes sequencing, rearrangement, and RNA analysis for the following 77 genes: AIP, ALK, APC, ATM, AXIN2, BAP1, BARD1, BLM, BMPR1A, BRCA1, BRCA2, BRIP1, CDC73, CDH1, CDK4, CDKN1B, CDKN2A, CHEK2, CTNNA1, DICER1, FANCC, FH, FLCN, GALNT12, KIF1B, LZTR1, MAX, MEN1, MET, MLH1, MSH2, MSH3, MSH6, MUTYH, NBN, NF1, NF2, NTHL1, PALB2, PHOX2B, PMS2, POT1, PRKAR1A, PTCH1, PTEN, RAD51C, RAD51D, RB1, RECQL, RET, SDHA, SDHAF2, SDHB, SDHC, SDHD, SMAD4, SMARCA4,  SMARCB1, SMARCE1, STK11, SUFU, TMEM127, TP53, TSC1, TSC2, VHL and XRCC2 (sequencing and deletion/duplication); EGFR, EGLN1, HOXB13, KIT, MITF, PDGFRA, POLD1, and POLE (sequencing only); EPCAM and GREM1 (deletion/duplication only).    03/09/2021 -  Anti-estrogen oral therapy   tamoxifen started 03/09/2021     CHIEF COMPLIANT:   INTERVAL HISTORY: Terrance Powell is a   ALLERGIES:  is allergic to penicillins and jardiance [empagliflozin].  MEDICATIONS:  Current Outpatient Medications  Medication Sig Dispense Refill   carvedilol (COREG) 3.125 MG tablet Take 1 tablet (3.125 mg total) by mouth 2 (two) times daily. 180 tablet 3   furosemide (LASIX) 40 MG tablet Take 1 tablet (40 mg total) by mouth 2 (two) times a week. Take 1 tablet Monday and Friday 30 tablet 3   polyethylene glycol (MIRALAX) 17 g packet Take 17 g by mouth daily. 14 each 0   potassium chloride SA (KLOR-CON M) 20 MEQ tablet Take 1 tablet (20 mEq total) by mouth 2 (two) times a week. Take with lasix dose 30 tablet 3   sacubitril-valsartan (ENTRESTO) 97-103 MG Take 1 tablet by mouth 2 (two) times daily. 180 tablet 3   tamoxifen (NOLVADEX) 20 MG tablet Take 1 tablet (20 mg total) by mouth daily. 90 tablet 3   No current facility-administered medications for this visit.    PHYSICAL EXAMINATION: ECOG PERFORMANCE STATUS: {CHL ONC ECOG PS:[phone]}  There were no vitals filed for this visit. There were no vitals filed for this visit.  BREAST:*** No palpable masses or nodules in either right or left breasts. No palpable axillary supraclavicular or infraclavicular adenopathy no breast tenderness or nipple discharge. (exam performed in the presence of a chaperone)  LABORATORY DATA:  I have reviewed the  data as listed    Latest Ref Rng & Units 12/30/2021    8:25 AM 12/07/2021    8:31 AM 11/10/2021    8:09 AM  CMP  Glucose 70 - 99 mg/dL 865  97  96   BUN 8 - 23 mg/dL 16  16  15    Creatinine 0.61 - 1.24 mg/dL 7.84  6.96  2.95    Sodium 135 - 145 mmol/L 140  141  140   Potassium 3.5 - 5.1 mmol/L 3.9  4.1  4.2   Chloride 98 - 111 mmol/L 110  109  110   CO2 22 - 32 mmol/L 25  25  24    Calcium 8.9 - 10.3 mg/dL 8.9  8.8  8.9   Total Protein 6.5 - 8.1 g/dL 6.6  6.4    Total Bilirubin 0.3 - 1.2 mg/dL 0.5  0.4    Alkaline Phos 38 - 126 U/L 45  43    AST 15 - 41 U/L 13  13    ALT 0 - 44 U/L 10  10      Lab Results  Component Value Date   WBC 8.5 03/23/2022   HGB 13.6 03/23/2022   HCT 39.3 03/23/2022   MCV 92.9 03/23/2022   PLT 239 03/23/2022   NEUTROABS 4.6 12/30/2021    ASSESSMENT & PLAN:  No problem-specific Assessment & Plan notes found for this encounter.    No orders of the defined types were placed in this encounter.  The patient has a good understanding of the overall plan. he agrees with it. he will call with any problems that may develop before the next visit here. Total time spent: 30 mins including face to face time and time spent for planning, charting and co-ordination of care   Sherlyn Lick, CMA 11/22/22    I Janan Ridge am acting as a Neurosurgeon for [employer]  ***

## 2022-11-22 NOTE — Telephone Encounter (Signed)
Patient's medication has been sent to his pharmacy.

## 2022-11-24 ENCOUNTER — Inpatient Hospital Stay: Payer: Medicare Other | Attending: Hematology and Oncology | Admitting: Hematology and Oncology

## 2022-11-24 ENCOUNTER — Other Ambulatory Visit: Payer: Self-pay

## 2022-11-24 VITALS — BP 150/88 | HR 68 | Temp 97.7°F | Resp 18 | Ht 70.0 in | Wt 218.8 lb

## 2022-11-24 DIAGNOSIS — Z79810 Long term (current) use of selective estrogen receptor modulators (SERMs): Secondary | ICD-10-CM | POA: Insufficient documentation

## 2022-11-24 DIAGNOSIS — C50121 Malignant neoplasm of central portion of right male breast: Secondary | ICD-10-CM | POA: Diagnosis present

## 2022-11-24 DIAGNOSIS — Z79899 Other long term (current) drug therapy: Secondary | ICD-10-CM | POA: Diagnosis not present

## 2022-11-24 DIAGNOSIS — Z9221 Personal history of antineoplastic chemotherapy: Secondary | ICD-10-CM | POA: Diagnosis not present

## 2022-11-24 DIAGNOSIS — Z170 Estrogen receptor positive status [ER+]: Secondary | ICD-10-CM | POA: Insufficient documentation

## 2022-11-24 DIAGNOSIS — Z9011 Acquired absence of right breast and nipple: Secondary | ICD-10-CM | POA: Diagnosis not present

## 2022-11-24 MED ORDER — TAMOXIFEN CITRATE 20 MG PO TABS: 20.0000 mg | ORAL_TABLET | Freq: Every day | ORAL | 3 refills | Status: DC

## 2022-11-24 NOTE — Assessment & Plan Note (Signed)
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  Breast Cancer Surveillance: Breast Exam: 11/24/22: Benign RTC in 1 year

## 2022-12-11 IMAGING — US US  BREAST BX W/ LOC DEV 1ST LESION IMG BX SPEC US GUIDE*R*
1 series · 12 of 12 positions shown · non-contrast
Comparison: Previous exam(s).
COMPARISON: Previous exam(s).

Addendum:
CLINICAL DATA: Biopsy of a right breast mass

EXAM:
ULTRASOUND GUIDED RIGHT BREAST CORE NEEDLE BIOPSY

[Series 1: us breast bx w/ loc dev 1st lesion img bx spec us  · 0.06mm/px · 12 of 12 slices shown]
[im 1/12]
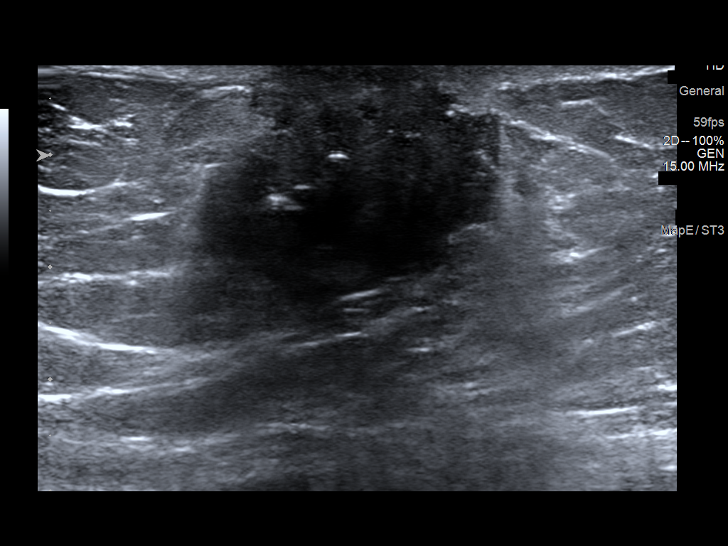
[im 2/12]
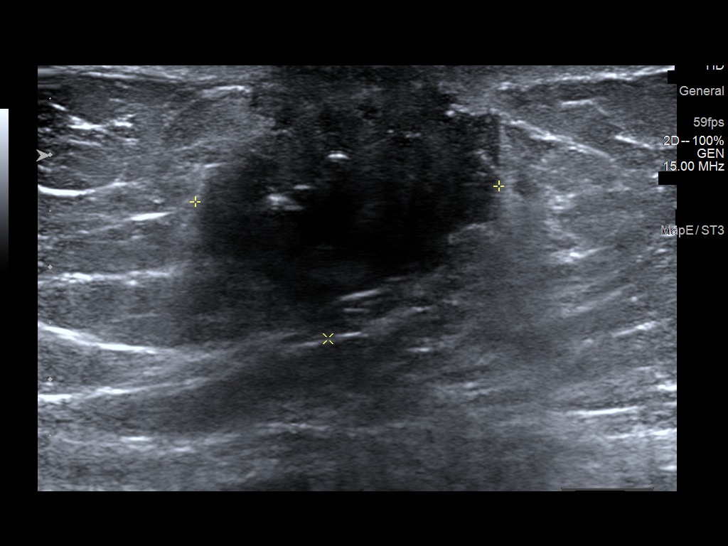
[im 3/12]
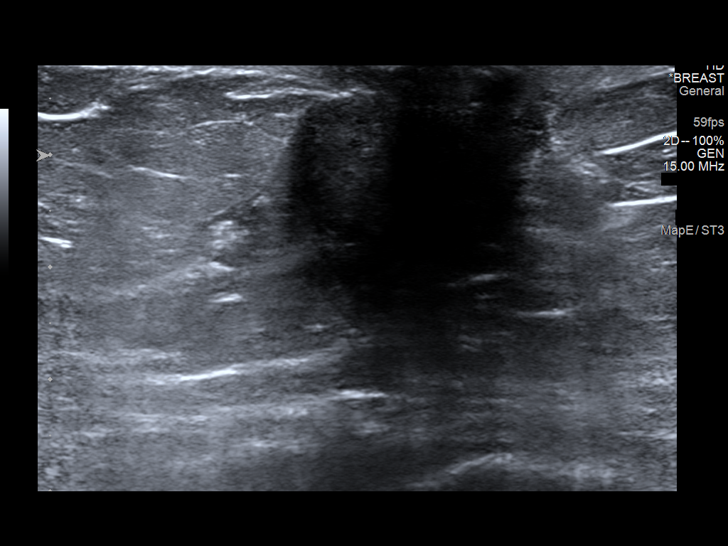
[im 4/12]
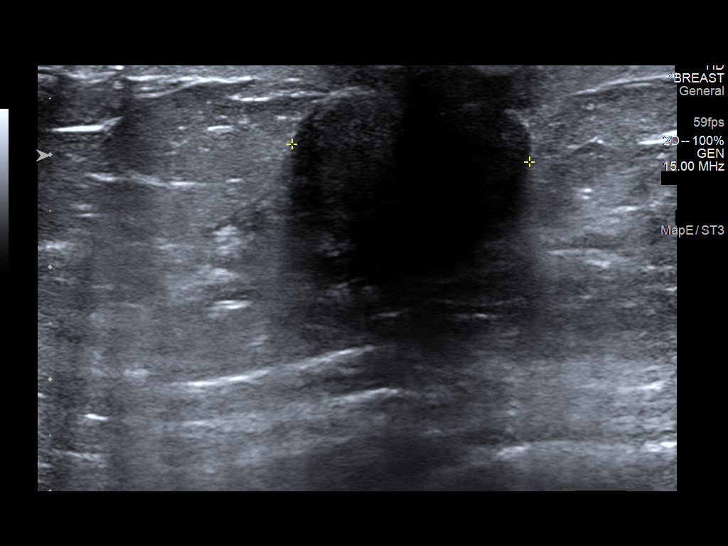
[im 5/12]
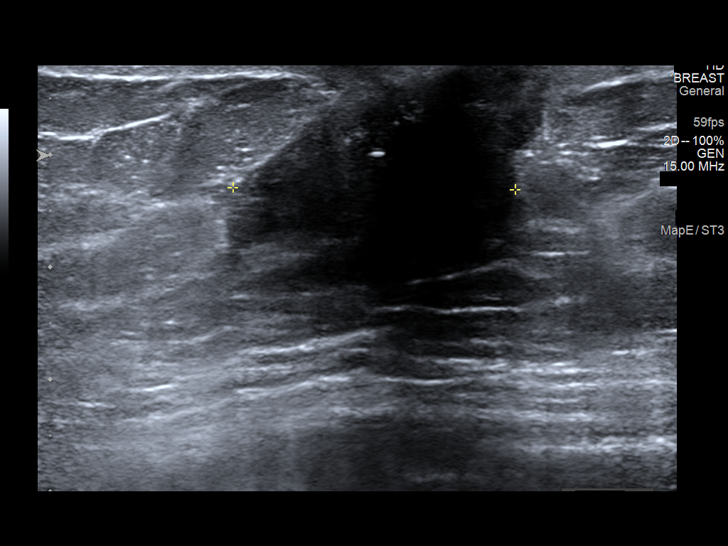
[im 6/12]
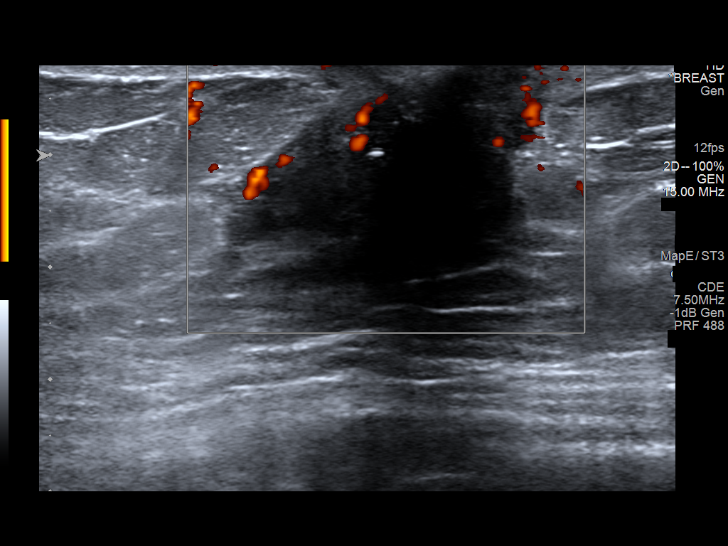
[im 7/12]
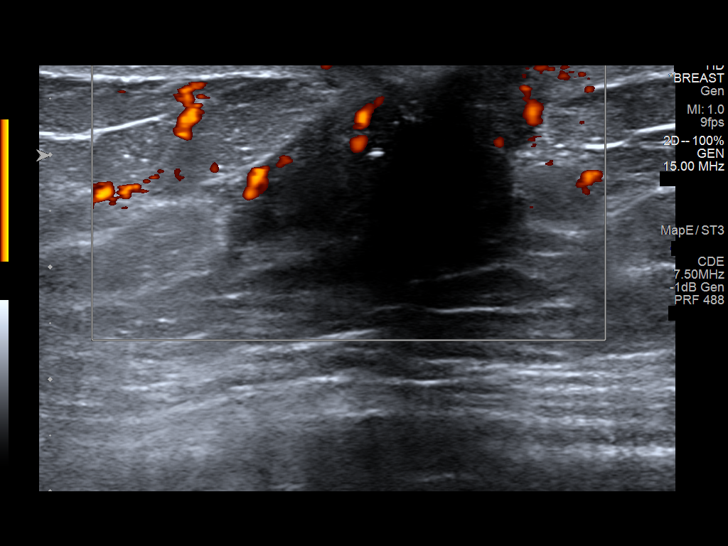
[im 8/12]
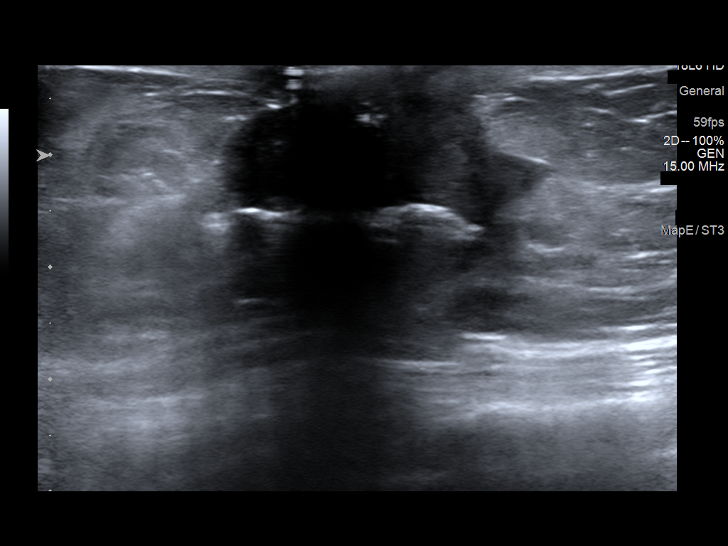
[im 9/12]
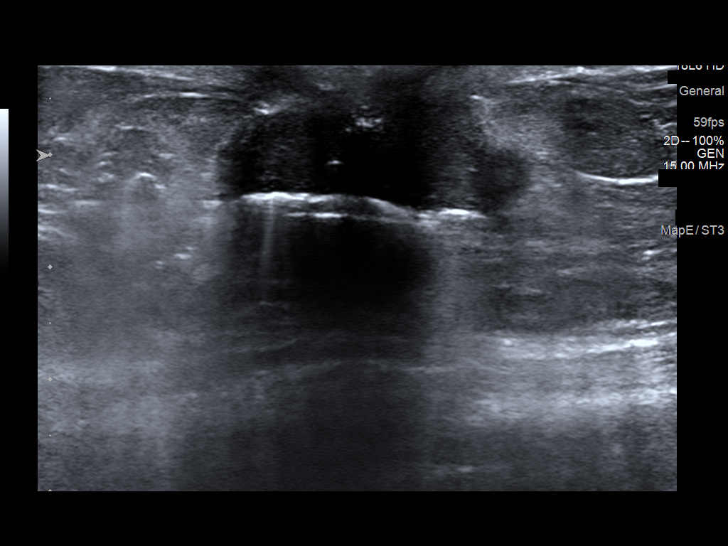
[im 10/12]
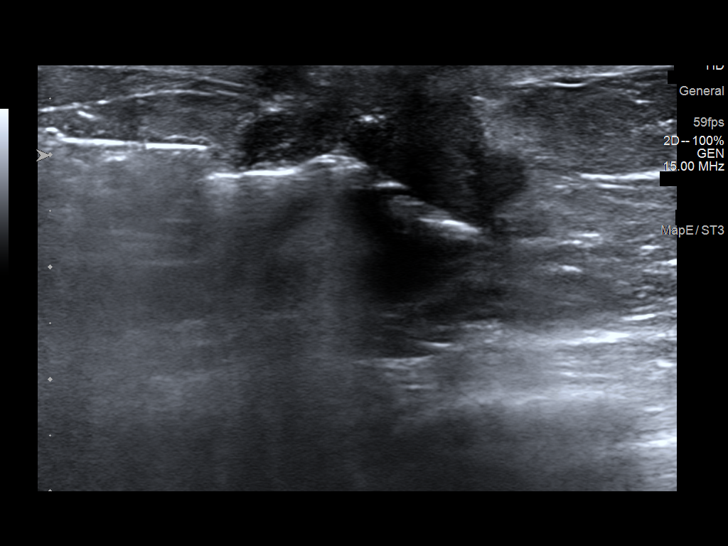
[im 11/12]
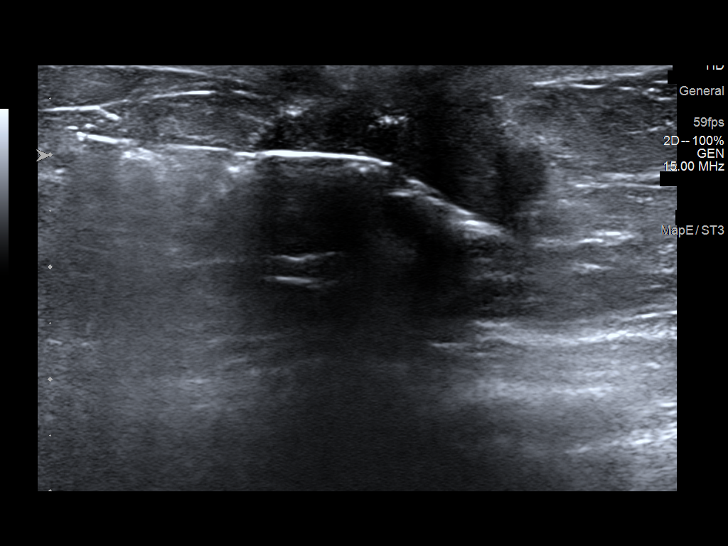
[im 12/12]
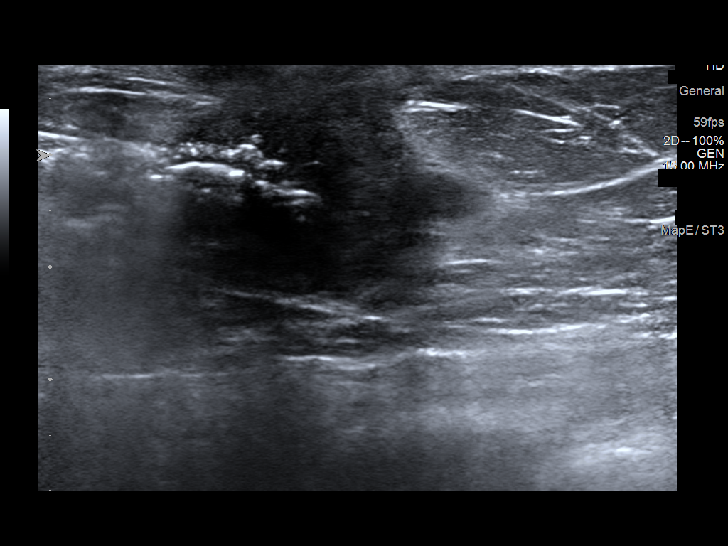

[12 of 12 positions shown; findings below may reference images not displayed]



Lesion quadrant: Subareolar right breast

Using sterile technique and 1% Lidocaine as local anesthetic, under
direct ultrasound visualization, a 12 gauge Ineh device was
used to perform biopsy of a subareolar right breast mass using a
lateral approach. At the conclusion of the procedure a ribbon shaped
tissue marker clip was deployed into the biopsy cavity. Follow up 2
view mammogram was performed and dictated separately.
IMPRESSION: Ultrasound guided biopsy of a subareolar right breast mass. No
apparent complications.

ADDENDUM:
Pathology revealed GRADE I-II INVASIVE MAMMARY CARCINOMA of the
Right breast, subareolar. This was found to be concordant by Dr.
Leng Hermann.

Pathology results were discussed with the patient by telephone. The
patient reported doing well after the biopsy with tenderness at the
site. Post biopsy instructions and care were reviewed and questions
were answered. The patient was encouraged to call The [REDACTED] for any additional concerns. My direct phone
number was provided.

Surgical consultation has been arranged with Dr. Handika Rostom at
[REDACTED] on December 05, 2020.

Pathology results reported by Karlton Leng, RN on 12/01/2020.



Lesion quadrant: Subareolar right breast

Using sterile technique and 1% Lidocaine as local anesthetic, under
direct ultrasound visualization, a 12 gauge Ineh device was
used to perform biopsy of a subareolar right breast mass using a
lateral approach. At the conclusion of the procedure a ribbon shaped
tissue marker clip was deployed into the biopsy cavity. Follow up 2
view mammogram was performed and dictated separately.
IMPRESSION: Ultrasound guided biopsy of a subareolar right breast mass. No
apparent complications.

## 2022-12-11 IMAGING — MG MM BREAST LOCALIZATION CLIP
4 series · 4 of 12 positions shown · non-contrast
Comparison: Previous exam(s).

CLINICAL DATA: Evaluate biopsy marker

EXAM:
3D DIAGNOSTIC RIGHT MAMMOGRAM POST ULTRASOUND BIOPSY

[R CC synth-2D]
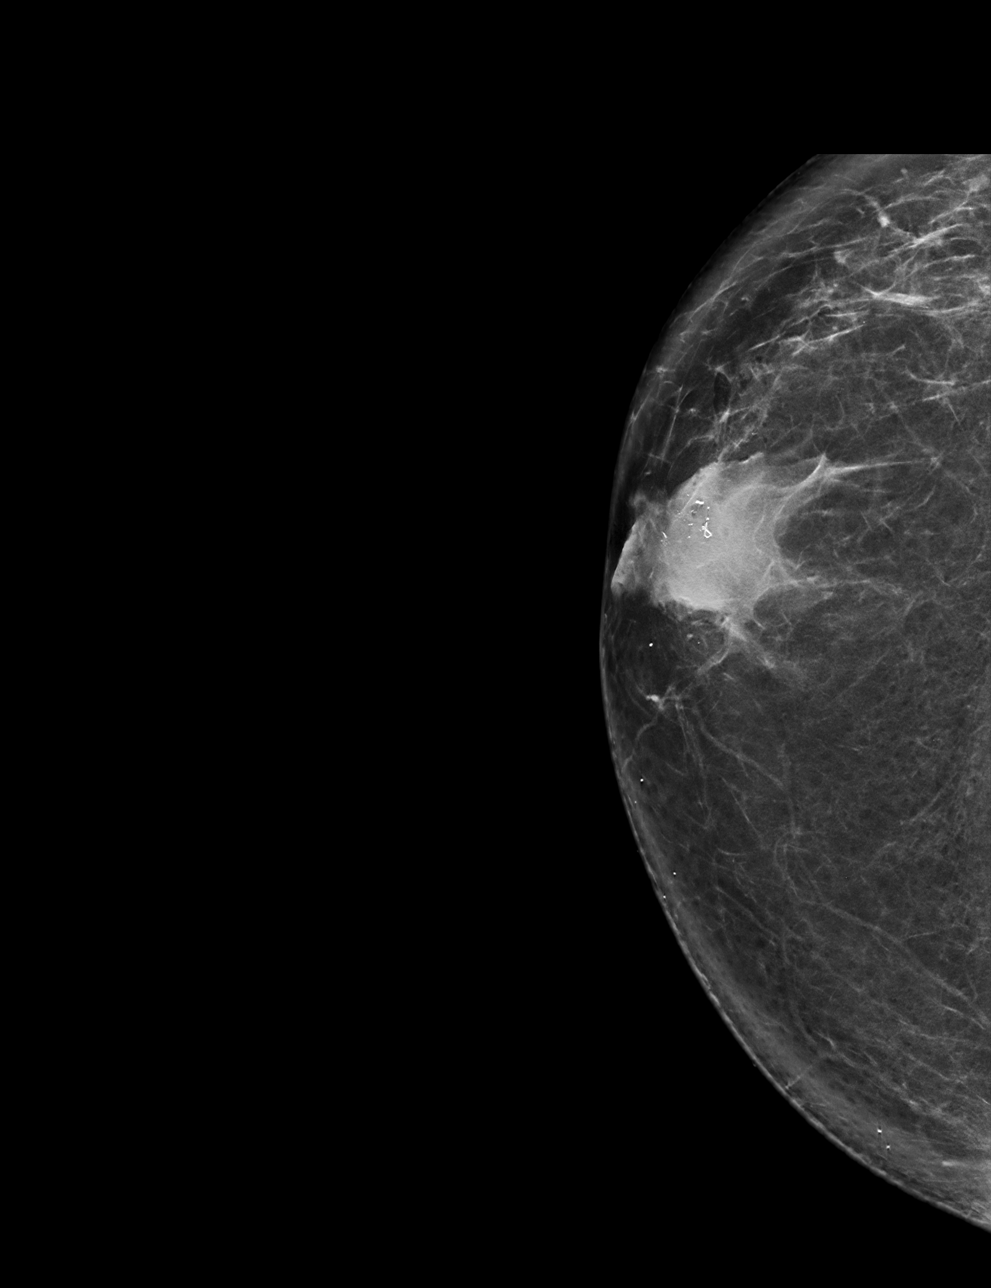

[R ML synth-2D]
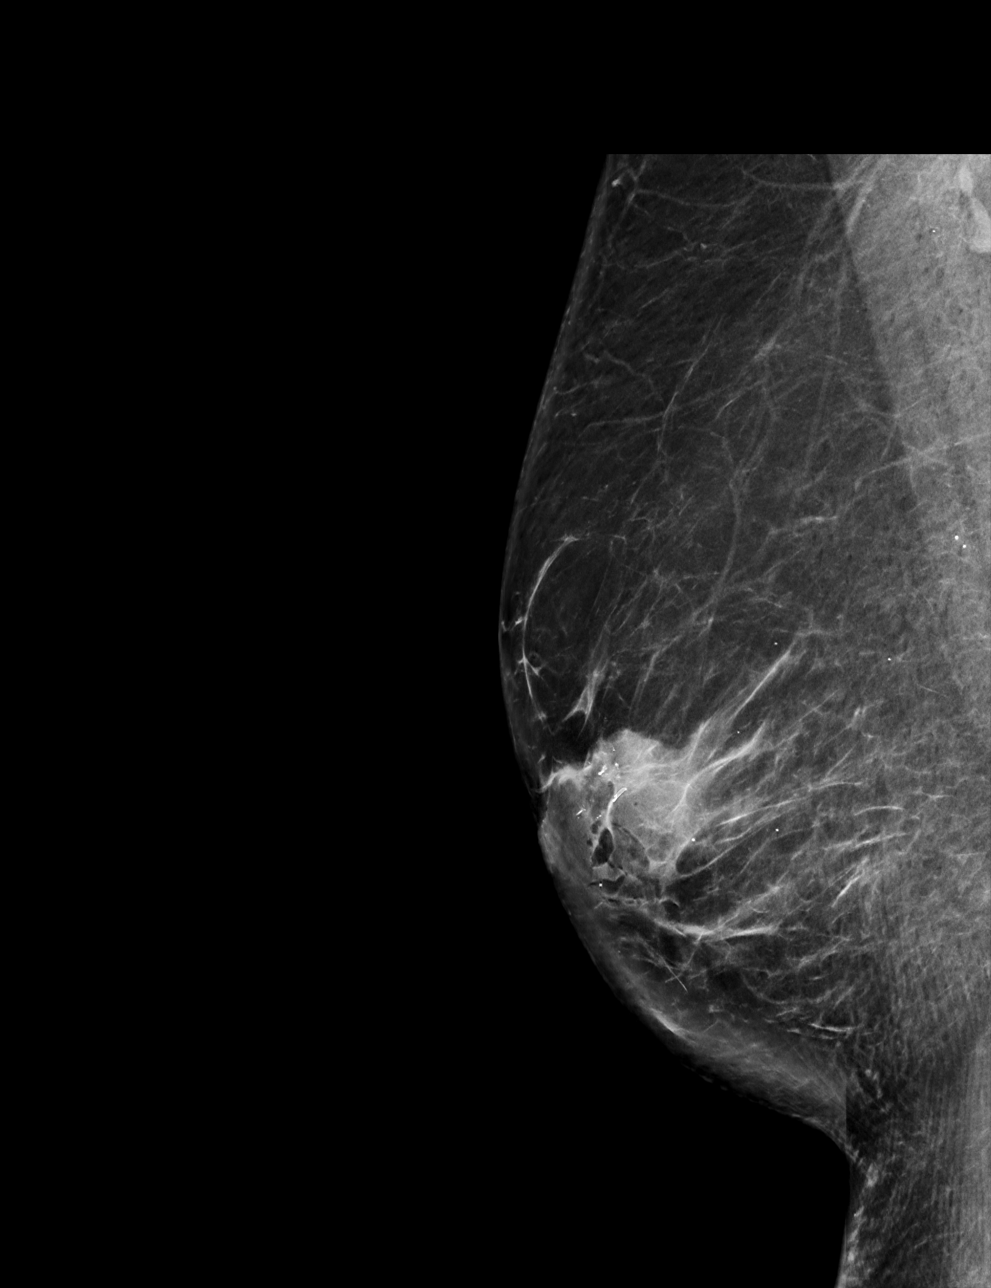

[R ML tomo · tomo slice 43/86.0]
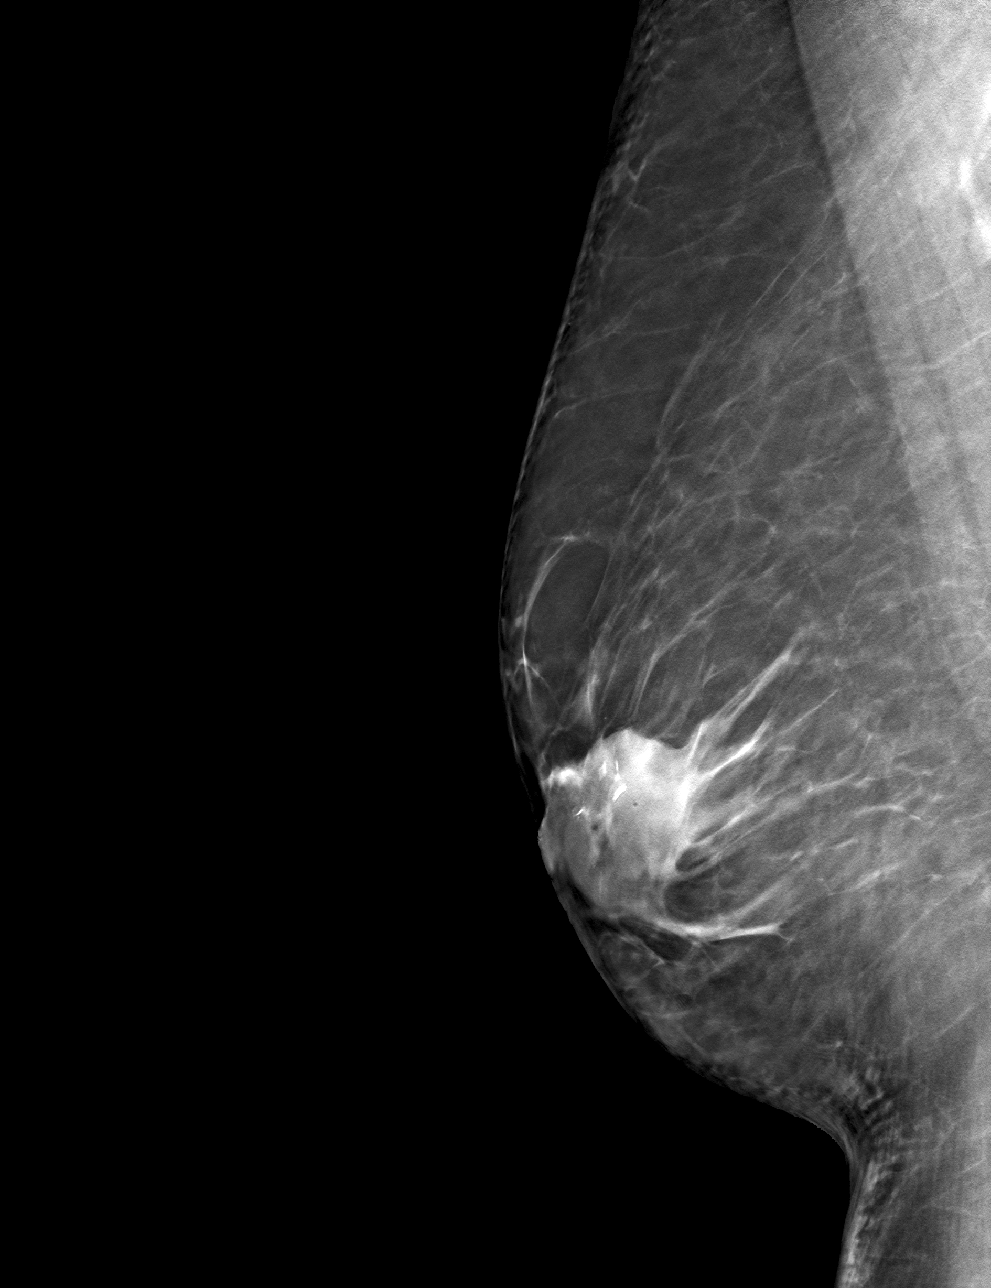

[R CC tomo · tomo slice 41/80.0]
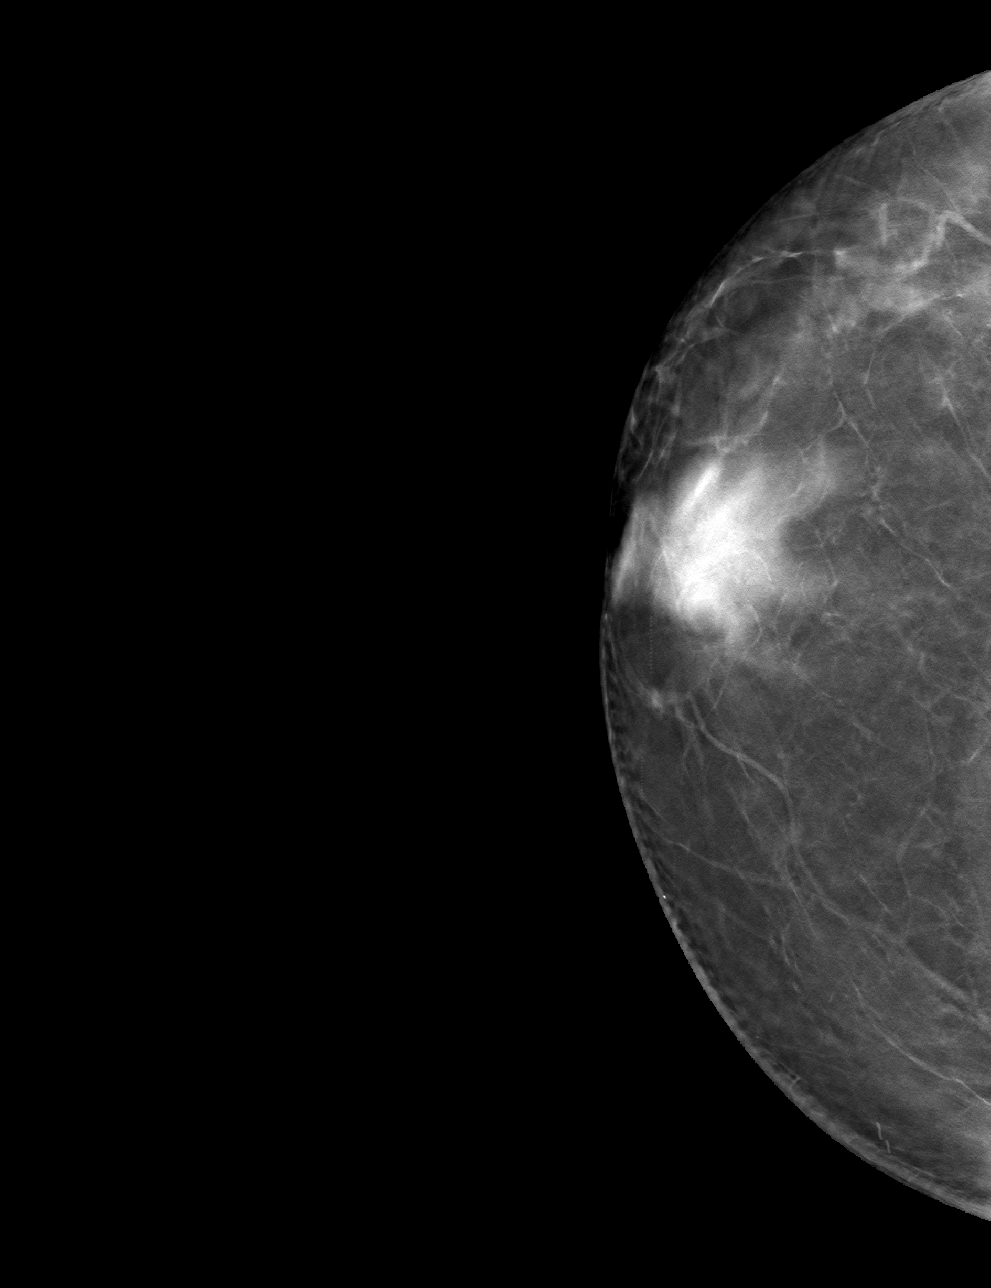

[4 of 12 positions shown; findings below may reference images not displayed]

FINDINGS: 3D Mammographic images were obtained following ultrasound guided
biopsy of a subareolar right breast mass. The biopsy marking clip is
in expected position at the site of biopsy.
IMPRESSION: Appropriate positioning of the ribbon shaped biopsy marking clip at
the site of biopsy in the biopsied right breast mass.

Final Assessment: Post Procedure Mammograms for Marker Placement

## 2023-03-07 ENCOUNTER — Ambulatory Visit: Payer: Medicare Other | Admitting: Hematology and Oncology

## 2023-06-07 ENCOUNTER — Other Ambulatory Visit (HOSPITAL_COMMUNITY): Payer: Self-pay | Admitting: Cardiology

## 2023-06-07 MED ORDER — CARVEDILOL 3.125 MG PO TABS: 3.1250 mg | ORAL_TABLET | Freq: Two times a day (BID) | ORAL | 3 refills | Status: DC

## 2023-06-07 MED ORDER — ENTRESTO 97-103 MG PO TABS: 1.0000 | ORAL_TABLET | Freq: Two times a day (BID) | ORAL | 3 refills | Status: AC

## 2023-08-30 ENCOUNTER — Other Ambulatory Visit (HOSPITAL_COMMUNITY): Payer: Self-pay | Admitting: Internal Medicine

## 2023-09-09 ENCOUNTER — Other Ambulatory Visit: Payer: Self-pay | Admitting: *Deleted

## 2023-09-09 MED ORDER — TAMOXIFEN CITRATE 20 MG PO TABS: 20.0000 mg | ORAL_TABLET | Freq: Every day | ORAL | 3 refills | Status: AC

## 2023-09-13 ENCOUNTER — Other Ambulatory Visit (HOSPITAL_COMMUNITY): Payer: Self-pay | Admitting: Cardiology

## 2023-09-13 MED ORDER — POTASSIUM CHLORIDE CRYS ER 20 MEQ PO TBCR: 20.0000 meq | EXTENDED_RELEASE_TABLET | ORAL | 3 refills | Status: AC

## 2023-09-13 MED ORDER — CARVEDILOL 3.125 MG PO TABS: 3.1250 mg | ORAL_TABLET | Freq: Two times a day (BID) | ORAL | 3 refills | Status: AC

## 2023-09-20 ENCOUNTER — Encounter (HOSPITAL_COMMUNITY): Payer: Self-pay | Admitting: Internal Medicine

## 2023-09-20 ENCOUNTER — Ambulatory Visit (HOSPITAL_COMMUNITY)
Admission: RE | Admit: 2023-09-20 | Discharge: 2023-09-20 | Disposition: A | Discharge status: Home / Self Care | Source: Ambulatory Visit | Attending: Internal Medicine | Admitting: Internal Medicine

## 2023-09-20 VITALS — BP 138/70 | HR 60 | Wt 221.0 lb

## 2023-09-20 DIAGNOSIS — C50121 Malignant neoplasm of central portion of right male breast: Secondary | ICD-10-CM

## 2023-09-20 DIAGNOSIS — I493 Ventricular premature depolarization: Secondary | ICD-10-CM | POA: Diagnosis not present

## 2023-09-20 DIAGNOSIS — Z853 Personal history of malignant neoplasm of breast: Secondary | ICD-10-CM | POA: Insufficient documentation

## 2023-09-20 DIAGNOSIS — I110 Hypertensive heart disease with heart failure: Secondary | ICD-10-CM | POA: Diagnosis not present

## 2023-09-20 DIAGNOSIS — I5022 Chronic systolic (congestive) heart failure: Secondary | ICD-10-CM | POA: Diagnosis present

## 2023-09-20 DIAGNOSIS — I427 Cardiomyopathy due to drug and external agent: Secondary | ICD-10-CM | POA: Diagnosis not present

## 2023-09-20 DIAGNOSIS — N1831 Chronic kidney disease, stage 3a: Secondary | ICD-10-CM | POA: Insufficient documentation

## 2023-09-20 DIAGNOSIS — Z9011 Acquired absence of right breast and nipple: Secondary | ICD-10-CM | POA: Diagnosis not present

## 2023-09-20 DIAGNOSIS — I10 Essential (primary) hypertension: Secondary | ICD-10-CM

## 2023-09-20 DIAGNOSIS — Z79899 Other long term (current) drug therapy: Secondary | ICD-10-CM | POA: Insufficient documentation

## 2023-09-20 DIAGNOSIS — Z85038 Personal history of other malignant neoplasm of large intestine: Secondary | ICD-10-CM | POA: Insufficient documentation

## 2023-09-20 DIAGNOSIS — Z170 Estrogen receptor positive status [ER+]: Secondary | ICD-10-CM

## 2023-09-20 DIAGNOSIS — T451X5A Adverse effect of antineoplastic and immunosuppressive drugs, initial encounter: Secondary | ICD-10-CM

## 2023-09-20 LAB — BASIC METABOLIC PANEL WITH GFR
Anion gap: 9 (ref 5–15)
BUN: 13 mg/dL (ref 8–23)
CO2: 23 mmol/L (ref 22–32)
Calcium: 9 mg/dL (ref 8.9–10.3)
Chloride: 110 mmol/L (ref 98–111)
Creatinine, Ser: 1.4 mg/dL — ABNORMAL HIGH (ref 0.61–1.24)
GFR, Estimated: 49 mL/min — ABNORMAL LOW (ref >60)
Glucose, Bld: 102 mg/dL — ABNORMAL HIGH (ref 70–99)
Potassium: 3.9 mmol/L (ref 3.5–5.1)
Sodium: 142 mmol/L (ref 135–145)

## 2023-09-20 LAB — BRAIN NATRIURETIC PEPTIDE: B Natriuretic Peptide: 110.6 pg/mL — ABNORMAL HIGH (ref 0.0–100.0)

## 2023-09-20 NOTE — Patient Instructions (Addendum)
 Good to see you today!  No medication changes were made  Labs done today, your results will be available in MyChart, we will contact you for abnormal readings.  Your physician has requested that you have an echocardiogram. Echocardiography is a painless test that uses sound waves to create images of your heart. It provides your doctor with information about the size and shape of your heart and how well your heart's chambers and valves are working. This procedure takes approximately one hour. There are no restrictions for this procedure. Please do NOT wear cologne, perfume, aftershave, or lotions (deodorant is allowed). Please arrive 15 minutes prior to your appointment time.  Please note: We ask at that you not bring children with you during ultrasound (echo/ vascular) testing. Due to room size and safety concerns, children are not allowed in the ultrasound rooms during exams. Our front office staff cannot provide observation of children in our lobby area while testing is being conducted. An adult accompanying a patient to their appointment will only be allowed in the ultrasound room at the discretion of the ultrasound technician under special circumstances. We apologize for any inconvenience.   Your physician recommends that you schedule a follow-up appointment 12 months( May 2026) Call office in March 2026 to schedule an appointment  If you have any questions or concerns before your next appointment please send us  a message through [place] or call our office at [phone].    TO LEAVE A MESSAGE FOR THE NURSE SELECT OPTION 2, PLEASE LEAVE A MESSAGE INCLUDING: YOUR NAME DATE OF BIRTH CALL BACK NUMBER REASON FOR CALL**this is important as we prioritize the call backs  YOU WILL RECEIVE A CALL BACK THE SAME DAY AS LONG AS YOU CALL BEFORE 4:00 PM At the [facility], you and your health needs are our priority. As part of our continuing mission to provide you with exceptional  heart care, we have created designated Provider Care Teams. These Care Teams include your primary Cardiologist (physician) and Advanced Practice Providers (APPs- Physician Assistants and Nurse Practitioners) who all work together to provide you with the care you need, when you need it.   You may see any of the following providers on your designated Care Team at your next follow up: Dr Jules Oar Dr Peder Bourdon Dr. Alwin Baars Dr. Arta Lark Amy Marijane Shoulders, NP Ruddy Corral, [place] [facility] [place], [place] Dennise Fitz, NP [place] Lee, NP Shawnee Dellen, NP Luster Salters, PharmD Bevely Brush, PharmD   Please be sure to bring in all your medications bottles to every appointment.    Thank you for choosing [facility]

## 2023-09-20 NOTE — Addendum Note (Signed)
 Encounter addended by: Ariannie Penaloza M, RN on: 09/20/2023 10:11 AM  Actions taken: Order list changed, Diagnosis association updated, Clinical Note Signed, Charge Capture section accepted

## 2023-09-20 NOTE — Progress Notes (Signed)
 CARDIO-ONCOLOGY CLINIC NOTE  Referring Physician: Dr. Lee Public Primary Care: Wyn Heater, MD Primary Cardiologist: None   HPI:  [name] is an  89 y.o. male with HTN,  CKD 3a, previous colon CA and right breast cancer referred by Dr. Gudena for enrollment into the Cardio-Oncology program due to possible Herceptin  cardiotoxicity.   Diagnosed with R breast cancer in 7/22. Had R mastectomy in 8/22. Stage IIa ER+, HER2 +, PR-  Started therapy with Herceptin  q21 days in 9/22  EF 2/23 EF  45-50% GLS -14.5 (I felt 50-55%) Echo 11/22 EF 50-55% GLS -20.7  Echo 8/22 55-60% GLS -21.9  Zio 3.23 6.2% PVCs  Echo 4/23 EF ~45% (definitely lower since previous) Herceptin  held Echo 6/23: EF 45-50% G1DD GLS -19.7% Herceptin  restarted Echo  12/28/21: EF 50-55% GLS -19.1%  Echo 03/23/22: EF 55-60% GLS -18.6% Mod LVH. Mild AI  Personally reviewed  cMRI 2/24: EF 51% RF 56% Non-specific basal anteroseptal mid-wall strip of LGE. Normal ECV   Here for f/u. Has finished Herceptin  11/23. At last visit he ran out of HF meds and we restarted. He remains on his HF meds. Doing great. Takes lasix  2x/week. No edema, orthopnea or PND.    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 180 tablet 3   furosemide  (LASIX ) 40 MG tablet TAKE 1 TABLET BY MOUTH TWICE WEEKLY ON MONDAY AND FRIDAY 24 tablet 3   polyethylene glycol (MIRALAX ) 17 g packet Take 17 g by mouth daily. 14 each 0   potassium chloride  SA (KLOR-CON  M) 20 MEQ tablet Take 1 tablet (20 mEq total) by mouth 2  (two) times a week. Take with lasix  dose 30 tablet 3   sacubitril-valsartan (ENTRESTO ) 97-103 MG Take 1 tablet by mouth 2 (two) times daily. 180 tablet 3   tamoxifen  (NOLVADEX ) 20 MG tablet Take 1 tablet (20 mg total) by mouth daily. 90 tablet 3   No current facility-administered medications for this encounter.    Allergies  Allergen Reactions   Penicillins Diarrhea, Itching and Other (See Comments)    TESTICULAR PAIN  Has patient had a PCN reaction causing immediate rash, facial/tongue/throat swelling, SOB or lightheadedness with hypotension: No SEVERE RASH INVOLVING MUCUS MEMBRANES or SKIN NECROSIS: #  #  #  YES  #  #  #  Has patient had a PCN reaction that required hospitalization No Has patient had a PCN reaction occurring within the last 10 years: No.    Jardiance  [Empagliflozin ] Other (See Comments)    Constipation       Social History   Socioeconomic History   Marital status: Married    Spouse name: Not on file   Number of children: Not on file   Years of education: Not on file   Highest education level: Not on file  Occupational History   Not on file  Tobacco Use   Smoking status: Former   Smokeless tobacco: Never   Tobacco comments:    "toyed with " none  in over 40 years"  Vaping Use   Vaping status: Never Used  Substance and Sexual Activity   Alcohol use: Yes    Comment: 09/08/2016 "might have a couple drinks on holidays"   Drug use: No   Sexual activity: Not Currently  Other Topics Concern   Not on file  Social History Narrative   Not on file   Social Drivers of Health   Financial Resource Strain: Not on file  Food Insecurity: Not on file  Transportation Needs: Not on file  Physical Activity: Not on file  Stress: Not on file  Social Connections: Not on file  Intimate Partner Violence: Not on file      Family History  Problem Relation Age of Onset   Breast cancer Mother        dx 24s-70s   Colon cancer Father 46       metastatic   Breast  cancer Maternal Aunt        x2 maternal aunts; dx 77s   Cancer Paternal Aunt        unknown type; d. 62s   Esophageal cancer Paternal Uncle        d. 29s   Cancer Paternal Uncle        unknown type; d. 64s   Ovarian cancer Paternal Grandmother        d. late 30s-early 61s    Vitals:   09/20/23 0914  BP: 138/70  Pulse: 60  SpO2: 99%  Weight: 100.2 kg (221 lb)     PHYSICAL EXAM: General:  Well appearing. No resp difficulty HEENT: normal Neck: supple. no JVD. Carotids 2+ bilat; no bruits. No lymphadenopathy or thryomegaly appreciated. Cor: PMI nondisplaced. Regular rate & rhythm. No rubs, gallops or murmurs. Lungs: clear Abdomen: soft, nontender, nondistended. No hepatosplenomegaly. No bruits or masses. Good bowel sounds. Extremities: no cyanosis, clubbing, rash, edema Neuro: alert & orientedx3, cranial nerves grossly intact. moves all 4 extremities w/o difficulty. Affect pleasant  ECG: NSR 60 No ST-T wave abnormalities. Personally reviewed   ASSESSMENT & PLAN:  1. Right Breast Cancer - Diagnosed 7/22. - R mastectomy in 8/22. Stage IIa ER+, HER2 +, PR-   - Started therapy with Herceptin  q21 days in 9/22 - Herceptin  held 4/23 x 3 doses - Echo 6/23 45-50%. GLS normal -> Herceptin  restarted - Echo 12/28/21: EF 50-55% GLS -19.1% -> continue Herceptin  - Herceptin  completed 11/23 - Echo 03/23/22: EF 55-60% GLS -18.6% Personally reviewed - cMRI 2/24: EF 51% RF 56% Non-specific basal anteroseptal mid-wall strip of LGE. Normal ECV  2. Chemo-induced cardiotoxicty with mild systolic HF - Echo 11/22 EF 50-55% GLS -20.7  - Echo 8/22 55-60% GLS -21.9 - Echo 2/23 45-50% GLS -14.5 (I felt 50-55%) - Echo 09/03/21 EF ~45% -> Herceptin  held  - Echo 6/23: EF 45-50% G1DD GLS -19.7%  - Echo 12/28/21: EF 50-55% GLS -19.1%  - Echo 05/23/21: EF 55-60% GLS -18.6% Personally reviewed - cMRI 2/24: EF 51% RF 56% Non-specific basal anteroseptal mid-wall strip of LGE. Normal ECV - Overall EF low  normal range may be mild herceptin  toxicity but has completed therapy - Stable NYHA I-II - Volume ok on lasix  2x/week - Continue carvedilol  3.125 bid  - Continue Entresto  97/103 bid - Off Jardiance  due to rash and constipation - Will avoid spiro for now with estrogen effect and breast CA - Given low normal EF. Continue GDMT - Repeat echo. Check labs - Encouraged him to be more active  3. HTN  -  BP ok  4. PVCs on ECG - Zio 3/23 6.2% PVCs. May be contributing to cardiomyopathy - Has not completed home sleep study - No PVCs on ECG today   Jules Oar, MD  9:54 AM

## 2023-11-03 ENCOUNTER — Ambulatory Visit (HOSPITAL_COMMUNITY)
Admission: RE | Admit: 2023-11-03 | Discharge: 2023-11-03 | Disposition: A | Discharge status: Home / Self Care | Source: Ambulatory Visit | Attending: Internal Medicine | Admitting: Internal Medicine

## 2023-11-03 DIAGNOSIS — I130 Hypertensive heart and chronic kidney disease with heart failure and stage 1 through stage 4 chronic kidney disease, or unspecified chronic kidney disease: Secondary | ICD-10-CM | POA: Diagnosis not present

## 2023-11-03 DIAGNOSIS — I5022 Chronic systolic (congestive) heart failure: Secondary | ICD-10-CM | POA: Insufficient documentation

## 2023-11-03 DIAGNOSIS — I358 Other nonrheumatic aortic valve disorders: Secondary | ICD-10-CM | POA: Insufficient documentation

## 2023-11-03 DIAGNOSIS — I351 Nonrheumatic aortic (valve) insufficiency: Secondary | ICD-10-CM | POA: Insufficient documentation

## 2023-11-03 DIAGNOSIS — N189 Chronic kidney disease, unspecified: Secondary | ICD-10-CM | POA: Insufficient documentation

## 2023-11-03 LAB — ECHOCARDIOGRAM COMPLETE
Area-P 1/2: 3.68 cm2
S' Lateral: 2.8 cm
Single Plane A4C EF: 50.1 %

## 2023-11-03 NOTE — Progress Notes (Signed)
  Echocardiogram 2D Echocardiogram has been performed.  Dione Franks 11/03/2023, 8:54 AM

## 2023-11-14 ENCOUNTER — Encounter: Payer: Self-pay | Admitting: Genetic Counselor

## 2023-11-14 NOTE — Progress Notes (Signed)
 UPDATE: 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 Amended report date 11/08/23.

## 2023-11-28 ENCOUNTER — Inpatient Hospital Stay: Payer: Medicare Other | Attending: Hematology and Oncology | Admitting: Hematology and Oncology

## 2023-11-28 VITALS — BP 132/63 | HR 60 | Temp 98.3°F | Resp 17 | Ht 70.0 in | Wt 225.8 lb

## 2023-11-28 DIAGNOSIS — Z85038 Personal history of other malignant neoplasm of large intestine: Secondary | ICD-10-CM | POA: Diagnosis not present

## 2023-11-28 DIAGNOSIS — Z9011 Acquired absence of right breast and nipple: Secondary | ICD-10-CM | POA: Insufficient documentation

## 2023-11-28 DIAGNOSIS — C50021 Malignant neoplasm of nipple and areola, right male breast: Secondary | ICD-10-CM | POA: Diagnosis present

## 2023-11-28 DIAGNOSIS — C50121 Malignant neoplasm of central portion of right male breast: Secondary | ICD-10-CM

## 2023-11-28 DIAGNOSIS — Z170 Estrogen receptor positive status [ER+]: Secondary | ICD-10-CM | POA: Insufficient documentation

## 2023-11-28 DIAGNOSIS — Z1731 Human epidermal growth factor receptor 2 positive status: Secondary | ICD-10-CM | POA: Diagnosis not present

## 2023-11-28 DIAGNOSIS — Z1722 Progesterone receptor negative status: Secondary | ICD-10-CM | POA: Diagnosis not present

## 2023-11-28 NOTE — Assessment & Plan Note (Signed)
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  Breast Cancer Surveillance: Breast Exam: 11/24/22: Benign RTC in 1 year

## 2023-11-28 NOTE — Progress Notes (Signed)
 Patient Care Team: Nanci Senior, MD as PCP - General (Family Medicine) Vernetta Berg, MD as Consulting Physician (General Surgery) Shannon Agent, MD as Consulting Physician (Radiation Oncology) Odean Potts, MD as Consulting Physician (Hematology and Oncology)  DIAGNOSIS:  Encounter Diagnosis  Name Primary?   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, HER2 positive, with a Ki-67 of 15%   12/15/2020 Cancer Staging   Staging form: Breast, AJCC 8th Edition - Clinical: Stage IIIB (cT4, cN0, cM0, G2, ER+, PR-, HER2+) - Signed by Layla Sandria BROCKS, MD on 12/15/2020 Histologic grading system: 3 grade system   12/30/2020 Surgery   Right mastectomy 12/30/2020 for a pT2, pN0, stage IIA invasive ductal carcinoma, grade 2, with negative margins 0/1 lymph node negative   01/22/2021 - 03/03/2022 Chemotherapy   Patient is on Treatment Plan : BREAST Trastuzumab  [id number]     02/13/2021 Genetic Testing   UPDATE: 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 Amended report date 11/08/23.  Negative hereditary cancer genetic testing: no pathogenic variants detected in [facility] CancerNext-Expanded +RNAinsight Panel.  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  The report date is February 13, 2021.   The CancerNext-Expanded gene panel offered by [facility] and includes sequencing, rearrangement, and RNA analysis for the following 77 genes: AIP, ALK, APC, ATM, AXIN2, BAP1, BARD1, BLM, BMPR1A, BRCA1, BRCA2, BRIP1, CDC73, CDH1, CDK4, CDKN1B, CDKN2A, CHEK2, CTNNA1, DICER1, FANCC,  FH, FLCN, GALNT12, KIF1B, LZTR1, MAX, MEN1, MET, MLH1, MSH2, MSH3, MSH6, MUTYH, NBN, NF1, NF2, NTHL1, PALB2, PHOX2B, PMS2, POT1, PRKAR1A, PTCH1, PTEN, RAD51C, RAD51D, RB1, RECQL, RET, SDHA, SDHAF2, SDHB, SDHC, SDHD, SMAD4, SMARCA4, SMARCB1, SMARCE1, STK11, SUFU, TMEM127, TP53, TSC1, TSC2, VHL and XRCC2 (sequencing and deletion/duplication); EGFR, EGLN1, HOXB13, KIT, MITF, PDGFRA, POLD1, and POLE (sequencing only); EPCAM and GREM1 (deletion/duplication only).    03/09/2021 -  Anti-estrogen oral therapy   tamoxifen  started 03/09/2021     CHIEF COMPLIANT: Follow-up of history of breast cancer and colon cancer  HISTORY OF PRESENT ILLNESS: Ms. Terrance Powell is a 89 year old with above-mentioned history of breast cancer is currently on tamoxifen  and is tolerating it extremely well without any problems.  He is main issues are related to chronic leg swelling for which she is using Lasix  as well as compression socks.  He denies any lumps or nodules in chest wall.  ALLERGIES:  is allergic to penicillins and jardiance  [empagliflozin ].  MEDICATIONS:  Current Outpatient Medications  Medication Sig Dispense Refill   carvedilol  (COREG ) 3.125 MG tablet Take 1 tablet (3.125 mg total) by mouth 2 (two) times daily. 180 tablet 3   furosemide  (LASIX ) 40 MG tablet TAKE 1 TABLET BY MOUTH TWICE WEEKLY ON MONDAY AND FRIDAY 24 tablet 3   polyethylene glycol (MIRALAX ) 17 g packet Take 17 g by mouth daily. 14 each 0   potassium chloride  SA (KLOR-CON  M) 20 MEQ tablet Take 1 tablet (20 mEq total) by mouth 2 (two) times a week. Take with lasix  dose 30 tablet 3   sacubitril-valsartan (ENTRESTO ) 97-103 MG Take 1 tablet by mouth 2 (two) times daily. 180 tablet 3   tamoxifen  (NOLVADEX ) 20 MG tablet Take  1 tablet (20 mg total) by mouth daily. 90 tablet 3   No current facility-administered medications for this visit.    PHYSICAL EXAMINATION: ECOG PERFORMANCE STATUS: 1 - Symptomatic but completely ambulatory  Vitals:   11/28/23  0855  BP: 132/63  Pulse: 60  Resp: 17  Temp: 98.3 F (36.8 C)  SpO2: 100%   Filed Weights   11/28/23 0855  Weight: 225 lb 12.8 oz (102.4 kg)    Physical Exam No palpable lumps or nodules on right chest wall or left breast or axilla.     (exam performed in the presence of a chaperone)  LABORATORY DATA:  I have reviewed the data as listed    Latest Ref Rng & Units 09/20/2023   10:08 AM 12/30/2021    8:25 AM 12/07/2021    8:31 AM  CMP  Glucose 70 - 99 mg/dL 897  894  97   BUN 8 - 23 mg/dL 13  16  16    Creatinine 0.61 - 1.24 mg/dL 8.59  8.77  8.72   Sodium 135 - 145 mmol/L 142  140  141   Potassium 3.5 - 5.1 mmol/L 3.9  3.9  4.1   Chloride 98 - 111 mmol/L 110  110  109   CO2 22 - 32 mmol/L 23  25  25    Calcium 8.9 - 10.3 mg/dL 9.0  8.9  8.8   Total Protein 6.5 - 8.1 g/dL  6.6  6.4   Total Bilirubin 0.3 - 1.2 mg/dL  0.5  0.4   Alkaline Phos 38 - 126 U/L  45  43   AST 15 - 41 U/L  13  13   ALT 0 - 44 U/L  10  10     Lab Results  Component Value Date   WBC 8.5 03/23/2022   HGB 13.6 03/23/2022   HCT 39.3 03/23/2022   MCV 92.9 03/23/2022   PLT 239 03/23/2022   NEUTROABS 4.6 12/30/2021    ASSESSMENT & PLAN:  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, estrogen receptor positive (HCC) 11/28/2020: Right central breast (retroareolar) biopsy 11/28/2020 for a clinical T2-T4, N0, stage IIA-IIIB invasive ductal carcinoma, 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 Genetic variation downgraded to normal. - Continue tamoxifen  20 MG oral daily.      No orders of the defined types were placed in this encounter.  The patient has a good understanding of the overall plan. he agrees with it. he will call with any problems that may develop before the next visit here. Total time spent: 30 mins including face to face time and time spent for planning, charting and co-ordination of care   Viinay K Zhania Shaheen, MD 11/28/23

## 2024-01-02 ENCOUNTER — Telehealth: Payer: Self-pay

## 2024-01-02 NOTE — Telephone Encounter (Signed)
 Patient called to state that he was running low on his tamoxifen  and that his pharmacy was telling him it was too early to refill. Confirmed with patient's [facility] pharmacy that patient had picked up prescription for tamoxifen  on 12/10/23 and had 2 remaining refills available. S/w patient regarding conversation with pharmacy. RN had patient read quantity off of medication label to confirm proper amount dispensed. Per patient, quantity is only showing as 30 dispensed instead of the expected 90.  Patient advised to return to pharmacy to have them fill the correct amount. Patient verbalized an understanding of the information and voiced appreciation for the call.

## 2024-01-06 ENCOUNTER — Encounter: Payer: Self-pay | Admitting: Genetic Counselor

## 2024-03-08 NOTE — Telephone Encounter (Signed)
 Per provider treatment was held this day. Attempting to close chart

## 2024-11-27 ENCOUNTER — Ambulatory Visit: Admitting: Hematology and Oncology
